# Patient Record
Sex: Female | Born: 1995 | Race: Black or African American | Hispanic: No | Smoking: Current some day smoker
Health system: Southern US, Community
[De-identification: ages and names within clinical notes are randomized; demographics above are authoritative.]

## PROBLEM LIST (undated history)

## (undated) ENCOUNTER — Inpatient Hospital Stay: Payer: Self-pay

## (undated) ENCOUNTER — Ambulatory Visit: Admission: EM | Payer: Medicaid Other | Source: Home / Self Care

## (undated) DIAGNOSIS — E663 Overweight: Secondary | ICD-10-CM

## (undated) DIAGNOSIS — D649 Anemia, unspecified: Secondary | ICD-10-CM

## (undated) DIAGNOSIS — N946 Dysmenorrhea, unspecified: Secondary | ICD-10-CM

## (undated) DIAGNOSIS — L219 Seborrheic dermatitis, unspecified: Secondary | ICD-10-CM

## (undated) DIAGNOSIS — T7840XA Allergy, unspecified, initial encounter: Secondary | ICD-10-CM

## (undated) HISTORY — DX: Overweight: E66.3

## (undated) HISTORY — DX: Dysmenorrhea, unspecified: N94.6

## (undated) HISTORY — DX: Allergy, unspecified, initial encounter: T78.40XA

## (undated) HISTORY — DX: Anemia, unspecified: D64.9

## (undated) HISTORY — PX: WISDOM TOOTH EXTRACTION: SHX21

## (undated) HISTORY — DX: Seborrheic dermatitis, unspecified: L21.9

---
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
## 2014-08-25 ENCOUNTER — Encounter: Payer: Self-pay | Admitting: Family Medicine

## 2014-08-25 ENCOUNTER — Ambulatory Visit (INDEPENDENT_AMBULATORY_CARE_PROVIDER_SITE_OTHER): Payer: Medicaid Other | Admitting: Family Medicine

## 2014-08-25 ENCOUNTER — Other Ambulatory Visit: Payer: Self-pay | Admitting: Family Medicine

## 2014-08-25 VITALS — BP 126/68 | HR 108 | Temp 98.6°F | Resp 20 | Ht 65.5 in | Wt 182.7 lb

## 2014-08-25 DIAGNOSIS — J069 Acute upper respiratory infection, unspecified: Secondary | ICD-10-CM | POA: Diagnosis not present

## 2014-08-25 DIAGNOSIS — R300 Dysuria: Secondary | ICD-10-CM | POA: Diagnosis not present

## 2014-08-25 DIAGNOSIS — N898 Other specified noninflammatory disorders of vagina: Secondary | ICD-10-CM

## 2014-08-25 DIAGNOSIS — J0110 Acute frontal sinusitis, unspecified: Secondary | ICD-10-CM | POA: Diagnosis not present

## 2014-08-25 LAB — POCT URINALYSIS DIPSTICK
Bilirubin, UA: NEGATIVE
Blood, UA: NEGATIVE
Glucose, UA: NEGATIVE
Ketones, UA: NEGATIVE
Leukocytes, UA: NEGATIVE
Nitrite, UA: NEGATIVE
Protein, UA: NEGATIVE
Spec Grav, UA: 1.02
Urobilinogen, UA: 0.2
pH, UA: 6

## 2014-08-25 MED ORDER — FLUCONAZOLE 150 MG PO TABS: 150.0000 mg | ORAL_TABLET | ORAL | Status: DC

## 2014-08-25 MED ORDER — AMOXICILLIN-POT CLAVULANATE 875-125 MG PO TABS: 1.0000 | ORAL_TABLET | Freq: Two times a day (BID) | ORAL | Status: DC

## 2014-08-25 MED ORDER — METRONIDAZOLE 0.75 % VA GEL: 1.0000 | Freq: Two times a day (BID) | VAGINAL | Status: DC

## 2014-08-25 NOTE — Patient Instructions (Signed)

## 2014-08-25 NOTE — Progress Notes (Signed)
Name: Hailey Martin   MRN: [id number]    DOB: 06/11/1995   Date:08/25/2014       Progress Note  Subjective  Chief Complaint  Chief Complaint  Patient presents with  . Dysuria    4 days  . URI    1 week- sore throat, fever  . Vaginal Discharge    itching-clear, white, milky    HPI  Dysuria: symptoms started 4 days ago, she has intermittently, she denies nocturia, but has some increase in frequency during the day, also has noticed a vaginal discharge past 2 days , that is described as white milky, and a fever since last night. Denies new sexual partner, she states she uses condoms sometimes, and denies dyspareunia at this time.  URI: symptoms started with a sore throat for the past week, some post-nasal drainage, and sometimes with a cough she has some green sputum, fever last night, frontal  Headache, no nausea, vomiting no SOB.     History  Substance Use Topics  . Smoking status: Never Smoker   . Smokeless tobacco: Not on file  . Alcohol Use: No    No current outpatient prescriptions on file.  No Known Allergies  ROS  Ten systems reviewed and is negative except as mentioned in HPI  Fever, low back pain, right flank, vaginal discharge, sore throat, headache. She has noticed some heart burn lately  Objective  Filed Vitals:   08/25/14 0903  BP: 126/68  Pulse: 108  Temp: 98.6 F (37 C)  TempSrc: Oral  Resp: 20  Height: 5' 5.5" (1.664 m)  Weight: 182 lb 11.2 oz (82.872 kg)  SpO2: 99%     Physical Exam  Constitutional: Patient appears well-developed and well-nourished. No distress. Obese HENT: Head: Normocephalic and atraumatic. Ears: B TMs ok, no erythema or effusion; Nose: Nose normal. Mouth/Throat: Oropharynx is clear and moist. Lith present on right tonsils. Tender frontal sinus Eyes: Conjunctivae and EOM are normal. Pupils are equal, round, and reactive to light. No scleral icterus.   Cardiovascular: Normal rate, regular rhythm and normal heart sounds.  No  murmur heard. No BLE edema. Pulmonary/Chest: Effort normal and breath sounds normal. No respiratory distress. Abdominal: Soft. Bowel sounds are normal, no distension. Mild discomfort on supra pubic area. no masses FEMALE GENITALIA:  External genitalia small amount of milky discharge External urethra normal Vaginal vault normal with white discharge Cervix normal without discharge or lesions Bimanual exam mild discomfort Skin: Skin is warm and dry.  No erythema.  Psychiatric: Patient has a normal mood and affect. behavior is normal. Judgment and thought content normal.  Recent Results (from the past 2160 hour(s))  POCT urinalysis dipstick     Status: None   Collection Time: 08/25/14  9:25 AM  Result Value Ref Range   Color, UA yellow    Clarity, UA clear    Glucose, UA negative    Bilirubin, UA negative    Ketones, UA negative    Spec Grav, UA 1.020    Blood, UA negative    pH, UA 6.0    Protein, UA negative    Urobilinogen, UA 0.2    Nitrite, UA negative    Leukocytes, UA Negative      Assessment & Plan  1. Dysuria ua was normal, we will send specimen for genprobe and urine culture - POCT urinalysis dipstick  2. Vaginal discharge  - PR WET MOUNTS/ W PREPARATIONS; Standing - PR WET MOUNTS/ W PREPARATIONS - GC/chlamydia probe amp, urine -  fluconazole (DIFLUCAN) 150 MG tablet; Take 1 tablet (150 mg total) by mouth 3 (three) times a week.  Dispense: 3 tablet; Refill: 0 - metroNIDAZOLE (METROGEL) 0.75 % vaginal gel; Place 1 Applicatorful vaginally 2 (two) times daily.  Dispense: 70 g; Refill: 0  3. Upper respiratory infection Take otc medications, we will treat sinusitis with antibiotics  4. Acute frontal sinusitis, recurrence not specified  - amoxicillin-clavulanate (AUGMENTIN) 875-125 MG per tablet; Take 1 tablet by mouth 2 (two) times daily.  Dispense: 20 tablet; Refill: 0

## 2014-08-29 ENCOUNTER — Telehealth: Payer: Self-pay | Admitting: Family Medicine

## 2014-08-29 NOTE — Telephone Encounter (Signed)
Pt was seen last week and was prescribed medication. She is now bleeding lightly. Her period began on May 17th and ended 6 days later. She is not understanding why the irregular bleeding. Please return call

## 2014-08-29 NOTE — Telephone Encounter (Signed)
Patient notified to call us back if bleeding does not stop.

## 2014-08-30 LAB — SPECIMEN STATUS REPORT

## 2014-08-30 LAB — URINE CULTURE

## 2014-08-31 ENCOUNTER — Telehealth: Payer: Self-pay

## 2014-08-31 NOTE — Telephone Encounter (Signed)
-----   Message from Alba CoryKrichna Sowles, MD sent at 08/30/2014  4:06 PM EDT ----- Urine culture, very low bacterial count, how is she doing?

## 2014-08-31 NOTE — Telephone Encounter (Signed)
Patient notified of urine results.

## 2014-09-05 ENCOUNTER — Telehealth: Payer: Self-pay | Admitting: Family Medicine

## 2014-09-05 NOTE — Telephone Encounter (Signed)
Schedule patient for an appointment for next week Tuesday looks good! We have nothing avaliable for this week! If she cannot wait go to ER or urgent care

## 2014-09-05 NOTE — Telephone Encounter (Signed)
Pt is still having the issue with bleeding and would like to schedule an appointment. States that she is bleeding like her period is on but its been well over a week and she has already had her menstral for the month. Please advise  [phone]

## 2014-09-07 ENCOUNTER — Encounter: Payer: Self-pay | Admitting: Family Medicine

## 2014-09-07 DIAGNOSIS — L210 Seborrhea capitis: Secondary | ICD-10-CM | POA: Insufficient documentation

## 2014-09-07 DIAGNOSIS — J302 Other seasonal allergic rhinitis: Secondary | ICD-10-CM | POA: Insufficient documentation

## 2014-09-07 DIAGNOSIS — N946 Dysmenorrhea, unspecified: Secondary | ICD-10-CM | POA: Insufficient documentation

## 2014-09-08 ENCOUNTER — Encounter: Payer: Self-pay | Admitting: Family Medicine

## 2014-09-08 ENCOUNTER — Ambulatory Visit (INDEPENDENT_AMBULATORY_CARE_PROVIDER_SITE_OTHER): Payer: Medicaid Other | Admitting: Family Medicine

## 2014-09-08 VITALS — BP 126/74 | HR 87 | Temp 98.5°F | Resp 18 | Ht 65.75 in | Wt 183.8 lb

## 2014-09-08 DIAGNOSIS — D509 Iron deficiency anemia, unspecified: Secondary | ICD-10-CM | POA: Diagnosis not present

## 2014-09-08 DIAGNOSIS — N921 Excessive and frequent menstruation with irregular cycle: Secondary | ICD-10-CM

## 2014-09-08 DIAGNOSIS — R102 Pelvic and perineal pain: Secondary | ICD-10-CM | POA: Diagnosis not present

## 2014-09-08 LAB — POCT URINE PREGNANCY: Preg Test, Ur: NEGATIVE

## 2014-09-08 NOTE — Progress Notes (Signed)
Name: Hailey Martin   MRN: [id number]    DOB: 05/04/1995   Date:09/08/2014       Progress Note  Subjective  Chief Complaint  Chief Complaint  Patient presents with  . Menstrual Problem    last period was 5/17 and then started bleeding again on 5/6 heavy steady flow with clots    HPI  Menorrhagia with irregular cycle: she usually has cycles about 28 days apart, but last cycle was 21 days apart and is lasting longer than usual, she also has noticed low back pain and mild cramping.  She is sexually active and states uses condoms 100% of the time.   Vulvar pain: she has a raiser cut on left labia majora from shaving last week, but also has noticed some pain on right pubic area during palpation.  No redness, no increase in warmth, no drainage. She does not recall trauma  Anemia iron deficiency: she has been anemic for many years, she is not taking iron supplementation as recommended.  hgb has been as low as 7.1, she never had iron infusion. She denies fatigue, no pica.    Patient Active Problem List   Diagnosis Date Noted  . Dysmenorrhea 09/07/2014  . Allergic rhinitis, seasonal 09/07/2014  . 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 History of cervicitis 11/26/2012    History reviewed. No pertinent past surgical history.  Family History  Problem Relation Age of Onset  . Heart failure Father 73    History   Social History  . Marital Status: Single    Spouse Name: N/A  . Number of Children: 0  . Years of Education: 12   Occupational History  . Deli  [employer]   Social History Main Topics  . Smoking status: Never Smoker   . Smokeless tobacco: Not on file  . Alcohol Use: No  . Drug Use: No  . Sexual Activity:    Partners: Male    Birth Control/ Protection: Condom   Other Topics Concern  . Not on file   Social History Narrative     Current outpatient prescriptions:  .  fluconazole (DIFLUCAN) 150 MG tablet, Take 1 tablet (150 mg total) by mouth 3 (three) times a  week., Disp: 3 tablet, Rfl: 0  No Known Allergies   ROS  Constitutional: Negative for fever or weight change.  Respiratory: Negative for cough and shortness of breath.   Cardiovascular: Negative for chest pain or palpitations.  Gastrointestinal: Negative for abdominal pain, no bowel changes.  Musculoskeletal: Negative for gait problem or joint swelling.  Skin: Negative for rash.  Neurological: Negative for dizziness or headache.  No other specific complaints in a complete review of systems (except as listed in HPI above).  Objective  Filed Vitals:   09/08/14 1146  BP: 126/74  Pulse: 87  Temp: 98.5 F (36.9 C)  TempSrc: Oral  Resp: 18  Height: 5' 5.75" (1.67 m)  Weight: 183 lb 12.8 oz (83.371 kg)  SpO2: 98%    Body mass index is 29.89 kg/(m^2).  Physical Exam Constitutional: Patient appears well-developed and well-nourished. No distress. Obese Eyes:  No scleral icterus.  Neck: Normal range of motion. Neck supple. Cardiovascular: Normal rate, regular rhythm and normal heart sounds.  No murmur heard. No BLE edema. Pulmonary/Chest: Effort normal and breath sounds normal. No respiratory distress. Abdominal: Soft.  There is no tenderness. Pelvic exam: pain during palpation of right supra pubic area, no redness, no increase in warm, some irritation on left labia majora -  per patient from shaving - no blisters, bimanual exam normal  Psychiatric: Patient has a normal mood and affect. behavior is normal. Judgment and thought content normal.  Recent Results (from the past 2160 hour(s))  Urine culture     Status: None   Collection Time: 08/25/14  9:00 AM  Result Value Ref Range   Urine Culture, Routine Final report    Result 1 Comment     Comment: Culture shows less than 10,000 colony forming units of bacteria per milliliter of urine. This colony count is not generally considered to be clinically significant.   Specimen status report     Status: None   Collection Time:  08/25/14  9:00 AM  Result Value Ref Range   specimen status report Comment     Comment: Written Authorization Written Authorization Written Authorization Received. Authorization received from original request 08-30-2014 Logged by Angie Ward   POCT urinalysis dipstick     Status: None   Collection Time: 08/25/14  9:25 AM  Result Value Ref Range   Color, UA yellow    Clarity, UA clear    Glucose, UA negative    Bilirubin, UA negative    Ketones, UA negative    Spec Grav, UA 1.020    Blood, UA negative    pH, UA 6.0    Protein, UA negative    Urobilinogen, UA 0.2    Nitrite, UA negative    Leukocytes, UA Negative       PHQ2/9: Depression screen PHQ 2/9 08/25/2014  Decreased Interest 0  Down, Depressed, Hopeless 0  PHQ - 2 Score 0    Fall Risk: Fall Risk  08/25/2014  Falls in the past year? No     Assessment & Plan  1. Menorrhagia with irregular cycle Explained cycles can go from 21 days to 42 days apart, we will check labs , urine pregnancy , discussed ocp to regulate cycles and also to prevent pregnancy but she refused - TSH - CBC - Comprehensive Metabolic Panel (CMET)   2. Vulvar pain Reassurance, avoid touching the area and return if no resolution  3. Iron deficiency anemia Recheck labs - Ferritin - Iron - Iron Binding Cap (TIBC)

## 2014-09-09 ENCOUNTER — Telehealth: Payer: Self-pay

## 2014-09-09 LAB — CBC
Hematocrit: 25.6 % — ABNORMAL LOW (ref 34.0–46.6)
Hemoglobin: 7.6 g/dL — ABNORMAL LOW (ref 11.1–15.9)
MCH: 21.6 pg — ABNORMAL LOW (ref 26.6–33.0)
MCHC: 29.7 g/dL — ABNORMAL LOW (ref 31.5–35.7)
MCV: 73 fL — ABNORMAL LOW (ref 79–97)
Platelets: 515 10*3/uL — ABNORMAL HIGH (ref 150–379)
RBC: 3.52 x10E6/uL — ABNORMAL LOW (ref 3.77–5.28)
RDW: 15.4 % (ref 12.3–15.4)
WBC: 5.2 10*3/uL (ref 3.4–10.8)

## 2014-09-09 LAB — FERRITIN: Ferritin: 7 ng/mL — ABNORMAL LOW (ref 15–77)

## 2014-09-09 LAB — COMPREHENSIVE METABOLIC PANEL
ALT: 11 IU/L (ref 0–32)
AST: 14 IU/L (ref 0–40)
Albumin/Globulin Ratio: 1.6 (ref 1.1–2.5)
Albumin: 4.2 g/dL (ref 3.5–5.5)
Alkaline Phosphatase: 52 IU/L (ref 39–117)
BUN/Creatinine Ratio: 17 (ref 8–20)
BUN: 8 mg/dL (ref 6–20)
Bilirubin Total: <0.2 mg/dL (ref 0.0–1.2)
CO2: 25 mmol/L (ref 18–29)
Calcium: 9.2 mg/dL (ref 8.7–10.2)
Chloride: 103 mmol/L (ref 97–108)
Creatinine, Ser: 0.48 mg/dL — ABNORMAL LOW (ref 0.57–1.00)
GFR calc Af Amer: 164 mL/min/{1.73_m2} (ref >59)
GFR calc non Af Amer: 143 mL/min/{1.73_m2} (ref >59)
Globulin, Total: 2.6 g/dL (ref 1.5–4.5)
Glucose: 84 mg/dL (ref 65–99)
Potassium: 4.3 mmol/L (ref 3.5–5.2)
Sodium: 142 mmol/L (ref 134–144)
Total Protein: 6.8 g/dL (ref 6.0–8.5)

## 2014-09-09 LAB — IRON AND TIBC
Iron Saturation: 4 % — CL (ref 15–55)
Iron: 15 ug/dL — ABNORMAL LOW (ref 27–159)
Total Iron Binding Capacity: 387 ug/dL (ref 250–450)
UIBC: 372 ug/dL (ref 131–425)

## 2014-09-09 LAB — PROLACTIN: Prolactin: 5.9 ng/mL (ref 4.8–23.3)

## 2014-09-09 LAB — TSH: TSH: 1.07 u[IU]/mL (ref 0.450–4.500)

## 2014-09-09 MED ORDER — FERROUS SULFATE 325 (65 FE) MG PO TABS: 325.0000 mg | ORAL_TABLET | Freq: Three times a day (TID) | ORAL | Status: DC

## 2014-09-09 NOTE — Telephone Encounter (Signed)
Sent prescription

## 2014-09-09 NOTE — Telephone Encounter (Signed)
Pt needs to know is the ferrous sulfate OTC or rx?

## 2014-09-13 ENCOUNTER — Ambulatory Visit: Payer: Medicaid Other | Admitting: Family Medicine

## 2014-11-01 ENCOUNTER — Encounter: Payer: Self-pay | Admitting: Emergency Medicine

## 2014-11-01 ENCOUNTER — Ambulatory Visit
Admission: EM | Admit: 2014-11-01 | Discharge: 2014-11-01 | Disposition: A | Discharge status: Home / Self Care | Payer: Medicaid Other | Attending: Family Medicine | Admitting: Family Medicine

## 2014-11-01 DIAGNOSIS — F17200 Nicotine dependence, unspecified, uncomplicated: Secondary | ICD-10-CM | POA: Insufficient documentation

## 2014-11-01 DIAGNOSIS — N390 Urinary tract infection, site not specified: Secondary | ICD-10-CM | POA: Diagnosis not present

## 2014-11-01 DIAGNOSIS — R109 Unspecified abdominal pain: Secondary | ICD-10-CM | POA: Diagnosis present

## 2014-11-01 LAB — URINALYSIS COMPLETE WITH MICROSCOPIC (ARMC ONLY)
Bilirubin Urine: NEGATIVE
Glucose, UA: NEGATIVE mg/dL
Hgb urine dipstick: NEGATIVE
Leukocytes, UA: NEGATIVE
Nitrite: NEGATIVE
Protein, ur: 30 mg/dL — AB
RBC / HPF: NONE SEEN RBC/hpf (ref <3)
Specific Gravity, Urine: 1.03 (ref 1.005–1.030)
pH: 6 (ref 5.0–8.0)

## 2014-11-01 LAB — PREGNANCY, URINE: Preg Test, Ur: NEGATIVE

## 2014-11-01 MED ORDER — SULFAMETHOXAZOLE-TRIMETHOPRIM 800-160 MG PO TABS: 1.0000 | ORAL_TABLET | Freq: Two times a day (BID) | ORAL | Status: DC

## 2014-11-01 NOTE — ED Provider Notes (Signed)
CSN: [id number]     Arrival date & time 11/01/14  1557 History   First MD Initiated Contact with Patient 11/01/14 1636     Chief Complaint  Patient presents with  . Abdominal Pain   (Consider location/radiation/quality/duration/timing/severity/associated sxs/prior Treatment) HPI Comments: 19 yo female with a 2 day h/o left sided back and abdominal pain. States has had "urine infections" in the past with similar symptoms. Denies any fevers, chills, vomiting, diarrhea, injuries, hematuria, dysuria, melena or hematochezia.   The history is provided by the patient.    History reviewed. No pertinent past medical history. History reviewed. No pertinent past surgical history. Family History  Problem Relation Age of Onset  . Heart failure Father 85   History  Substance Use Topics  . Smoking status: Current Every Day Smoker  . Smokeless tobacco: Never Used  . Alcohol Use: Not on file   OB History    No data available     Review of Systems  Allergies  Review of patient's allergies indicates no known allergies.  Home Medications   Prior to Admission medications   Medication Sig Start Date End Date Taking? Authorizing Provider  ferrous sulfate 325 (65 FE) MG tablet Take 1 tablet (325 mg total) by mouth 3 (three) times daily. 09/09/14   Alba Cory, MD  fluconazole (DIFLUCAN) 150 MG tablet Take 1 tablet (150 mg total) by mouth 3 (three) times a week. 08/25/14   Alba Cory, MD  sulfamethoxazole-trimethoprim (BACTRIM DS,SEPTRA DS) 800-160 MG per tablet Take 1 tablet by mouth 2 (two) times daily. 11/01/14   Payton Mccallum, MD   BP 122/72 mmHg  Pulse 72  Temp(Src) 98.6 F (37 C) (Oral)  Resp 18  Ht  (1.676 m)  Wt 188 lb (85.276 kg)  BMI 30.36 kg/m2  SpO2 99%  LMP 10/20/2014 Physical Exam  Constitutional: She appears well-developed and well-nourished. No distress.  Abdominal: Soft. Bowel sounds are normal. She exhibits no distension and no mass. There is no tenderness. There  is no rebound and no guarding.  No CVA tenderness  Skin: No rash noted. She is not diaphoretic.  Nursing note and vitals reviewed.   ED Course  Procedures (including critical care time) Labs Review Labs Reviewed  URINALYSIS COMPLETEWITH MICROSCOPIC ([facility] ONLY) - Abnormal; Notable for the following:    Color, Urine AMBER (*)    APPearance CLOUDY (*)    Ketones, ur TRACE (*)    Protein, ur 30 (*)    Bacteria, UA MANY (*)    Squamous Epithelial / LPF 6-30 (*)    All other components within normal limits  URINE CULTURE  PREGNANCY, URINE    Imaging Review No results found.   MDM   1. UTI (lower urinary tract infection)    Discharge Medication List as of 11/01/2014  5:53 PM    START taking these medications   Details  sulfamethoxazole-trimethoprim (BACTRIM DS,SEPTRA DS) 800-160 MG per tablet Take 1 tablet by mouth 2 (two) times daily., Starting 11/01/2014, Until Discontinued, Normal      Plan: 1. Test results and diagnosis reviewed with patient 2. rx as per orders; risks, benefits, potential side effects reviewed with patient 3. Recommend supportive treatment with increased water intake 4. F/u prn if symptoms worsen or don't improve    Payton Mccallum, MD 11/01/14 1850

## 2014-11-01 NOTE — ED Notes (Signed)
Pt with left sidev abd pain x 2 days after drinking soda

## 2014-11-08 ENCOUNTER — Ambulatory Visit (INDEPENDENT_AMBULATORY_CARE_PROVIDER_SITE_OTHER): Payer: Medicaid Other | Admitting: Family Medicine

## 2014-11-08 ENCOUNTER — Encounter: Payer: Self-pay | Admitting: Family Medicine

## 2014-11-08 VITALS — BP 122/70 | HR 93 | Temp 98.2°F | Resp 18 | Ht 65.75 in | Wt 180.0 lb

## 2014-11-08 DIAGNOSIS — D509 Iron deficiency anemia, unspecified: Secondary | ICD-10-CM

## 2014-11-08 DIAGNOSIS — Z3201 Encounter for pregnancy test, result positive: Secondary | ICD-10-CM

## 2014-11-08 DIAGNOSIS — Z23 Encounter for immunization: Secondary | ICD-10-CM | POA: Diagnosis not present

## 2014-11-08 DIAGNOSIS — Z862 Personal history of diseases of the blood and blood-forming organs and certain disorders involving the immune mechanism: Secondary | ICD-10-CM | POA: Insufficient documentation

## 2014-11-08 LAB — POCT URINE PREGNANCY: Preg Test, Ur: NEGATIVE

## 2014-11-08 NOTE — Patient Instructions (Signed)
Contraception Choices Contraception (birth control) is the use of any methods or devices to prevent pregnancy. Below are some methods to help avoid pregnancy. HORMONAL METHODS   Contraceptive implant. This is a thin, plastic tube containing progesterone hormone. It does not contain estrogen hormone. Your health care provider inserts the tube in the inner part of the upper arm. The tube can remain in place for up to 3 years. After 3 years, the implant must be removed. The implant prevents the ovaries from releasing an egg (ovulation), thickens the cervical mucus to prevent sperm from entering the uterus, and thins the lining of the inside of the uterus.  Progesterone-only injections. These injections are given every 3 months by your health care provider to prevent pregnancy. This synthetic progesterone hormone stops the ovaries from releasing eggs. It also thickens cervical mucus and changes the uterine lining. This makes it harder for sperm to survive in the uterus.  Birth control pills. These pills contain estrogen and progesterone hormone. They work by preventing the ovaries from releasing eggs (ovulation). They also cause the cervical mucus to thicken, preventing the sperm from entering the uterus. Birth control pills are prescribed by a health care provider.Birth control pills can also be used to treat heavy periods.  Minipill. This type of birth control pill contains only the progesterone hormone. They are taken every day of each month and must be prescribed by your health care provider.  Birth control patch. The patch contains hormones similar to those in birth control pills. It must be changed once a week and is prescribed by a health care provider.  Vaginal ring. The ring contains hormones similar to those in birth control pills. It is left in the vagina for 3 weeks, removed for 1 week, and then a new one is put back in place. The patient must be comfortable inserting and removing the ring  from the vagina.A health care provider's prescription is necessary.  Emergency contraception. Emergency contraceptives prevent pregnancy after unprotected sexual intercourse. This pill can be taken right after sex or up to 5 days after unprotected sex. It is most effective the sooner you take the pills after having sexual intercourse. Most emergency contraceptive pills are available without a prescription. Check with your pharmacist. Do not use emergency contraception as your only form of birth control. BARRIER METHODS   Female condom. This is a thin sheath (latex or rubber) that is worn over the penis during sexual intercourse. It can be used with spermicide to increase effectiveness.  Female condom. This is a soft, loose-fitting sheath that is put into the vagina before sexual intercourse.  Diaphragm. This is a soft, latex, dome-shaped barrier that must be fitted by a health care provider. It is inserted into the vagina, along with a spermicidal jelly. It is inserted before intercourse. The diaphragm should be left in the vagina for 6 to 8 hours after intercourse.  Cervical cap. This is a round, soft, latex or plastic cup that fits over the cervix and must be fitted by a health care provider. The cap can be left in place for up to 48 hours after intercourse.  Sponge. This is a soft, circular piece of polyurethane foam. The sponge has spermicide in it. It is inserted into the vagina after wetting it and before sexual intercourse.  Spermicides. These are chemicals that kill or block sperm from entering the cervix and uterus. They come in the form of creams, jellies, suppositories, foam, or tablets. They do not require a   prescription. They are inserted into the vagina with an applicator before having sexual intercourse. The process must be repeated every time you have sexual intercourse. INTRAUTERINE CONTRACEPTION  Intrauterine device (IUD). This is a T-shaped device that is put in a woman's uterus  during a menstrual period to prevent pregnancy. There are 2 types:  Copper IUD. This type of IUD is wrapped in copper wire and is placed inside the uterus. Copper makes the uterus and fallopian tubes produce a fluid that kills sperm. It can stay in place for 10 years.  Hormone IUD. This type of IUD contains the hormone progestin (synthetic progesterone). The hormone thickens the cervical mucus and prevents sperm from entering the uterus, and it also thins the uterine lining to prevent implantation of a fertilized egg. The hormone can weaken or kill the sperm that get into the uterus. It can stay in place for 3-5 years, depending on which type of IUD is used. PERMANENT METHODS OF CONTRACEPTION  Female tubal ligation. This is when the woman's fallopian tubes are surgically sealed, tied, or blocked to prevent the egg from traveling to the uterus.  Hysteroscopic sterilization. This involves placing a small coil or insert into each fallopian tube. Your doctor uses a technique called hysteroscopy to do the procedure. The device causes scar tissue to form. This results in permanent blockage of the fallopian tubes, so the sperm cannot fertilize the egg. It takes about 3 months after the procedure for the tubes to become blocked. You must use another form of birth control for these 3 months.  Female sterilization. This is when the female has the tubes that carry sperm tied off (vasectomy).This blocks sperm from entering the vagina during sexual intercourse. After the procedure, the man can still ejaculate fluid (semen). NATURAL PLANNING METHODS  Natural family planning. This is not having sexual intercourse or using a barrier method (condom, diaphragm, cervical cap) on days the woman could become pregnant.  Calendar method. This is keeping track of the length of each menstrual cycle and identifying when you are fertile.  Ovulation method. This is avoiding sexual intercourse during ovulation.  Symptothermal  method. This is avoiding sexual intercourse during ovulation, using a thermometer and ovulation symptoms.  Post-ovulation method. This is timing sexual intercourse after you have ovulated. Regardless of which type or method of contraception you choose, it is important that you use condoms to protect against the transmission of sexually transmitted infections (STIs). Talk with your health care provider about which form of contraception is most appropriate for you. Document Released: 03/11/2005 Document Revised: 03/16/2013 Document Reviewed: 09/03/2012 [employer] Patient Information 2015 [employer]. This information is not intended to replace advice given to you by your health care provider. Make sure you discuss any questions you have with your health care provider.  

## 2014-11-08 NOTE — Progress Notes (Signed)
Name: Hailey Martin   MRN: [id number]    DOB: 1995/12/18   Date:11/08/2014       Progress Note  Subjective  Chief Complaint  Chief Complaint  Patient presents with  . Other    Patient has had two positive at home pregnant test, last LMP was October 12, 2014.     HPI  Pregnancy test positive: she is dating - Caesar Chestnut - and LMP was 10/12/2014, she had pregnancy test done 3 days ago and it was positive twice. She also has noticed some abdominal cramping, some breast tenderness and has increase in vaginal discharge - no pruritus no odor. She has not been using protection to avoid pregnancy  Iron deficiency anemia: still not taking ferrous sulfate   Patient Active Problem List   Diagnosis Date Noted  . History of anemia 11/08/2014  . Dysmenorrhea 09/07/2014  . Allergic rhinitis, seasonal 09/07/2014  . 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 History of cervicitis 11/26/2012    History reviewed. No pertinent past surgical history.  Family History  Problem Relation Age of Onset  . Heart failure Father 37    Social History   Social History  . Marital Status: Single    Spouse Name: N/A  . Number of Children: 0  . Years of Education: 12   Occupational History  . Deli  [employer]   Social History Main Topics  . Smoking status: Never Smoker   . Smokeless tobacco: Never Used  . Alcohol Use: No  . Drug Use: No  . Sexual Activity:    Partners: Male    Birth Control/ Protection: Condom   Other Topics Concern  . Not on file   Social History Narrative     Current outpatient prescriptions:  .  ferrous sulfate 325 (65 FE) MG tablet, Take 1 tablet (325 mg total) by mouth 3 (three) times daily., Disp: 90 tablet, Rfl: 2 .  metroNIDAZOLE (METROGEL) 0.75 % vaginal gel, I 1 APL VAGINALLY BID, Disp: , Rfl: 0  No Known Allergies   ROS  Constitutional: Negative for fever or weight change.  Respiratory: Negative for cough and shortness of breath.   Cardiovascular: Negative for chest  pain or palpitations.  Gastrointestinal: Negative for abdominal pain, no bowel changes.  Musculoskeletal: Negative for gait problem or joint swelling.  Skin: Negative for rash.  Neurological: Negative for dizziness or headache.  No other specific complaints in a complete review of systems (except as listed in HPI above).  Objective  Filed Vitals:   11/08/14 1200  BP: 122/70  Pulse: 93  Temp: 98.2 F (36.8 C)  TempSrc: Oral  Resp: 18  Height: 5' 5.75" (1.67 m)  Weight: 180 lb (81.647 kg)  SpO2: 97%    Body mass index is 29.28 kg/(m^2).  Physical Exam  Constitutional: Patient appears well-developed and well-nourished. Obese  No distress.  HEENT: head atraumatic, normocephalic, pupils equal and reactive to light, neck supple, throat within normal limits Cardiovascular: Normal rate, regular rhythm and normal heart sounds.  No murmur heard. No BLE edema. Pulmonary/Chest: Effort normal and breath sounds normal. No respiratory distress. Abdominal: Soft.  There is no tenderness. Mild supra pubic pain - she refuses to be checked for STI or have pelvic exam. Psychiatric: Patient has a normal mood and affect. behavior is normal. Judgment and thought content normal.  Recent Results (from the past 2160 hour(s))  Urine culture     Status: None   Collection Time: 08/25/14  9:00 AM  Result Value  Ref Range   Urine Culture, Routine Final report    Result 1 Comment     Comment: Culture shows less than 10,000 colony forming units of bacteria per milliliter of urine. This colony count is not generally considered to be clinically significant.   Specimen status report     Status: None   Collection Time: 08/25/14  9:00 AM  Result Value Ref Range   specimen status report Comment     Comment: Written Authorization Written Authorization Written Authorization Received. Authorization received from original request 08-30-2014 Logged by Angie Ward   POCT urinalysis dipstick     Status: None    Collection Time: 08/25/14  9:25 AM  Result Value Ref Range   Color, UA yellow    Clarity, UA clear    Glucose, UA negative    Bilirubin, UA negative    Ketones, UA negative    Spec Grav, UA 1.020    Blood, UA negative    pH, UA 6.0    Protein, UA negative    Urobilinogen, UA 0.2    Nitrite, UA negative    Leukocytes, UA Negative   POCT urine pregnancy     Status: Normal   Collection Time: 09/08/14 12:40 PM  Result Value Ref Range   Preg Test, Ur Negative Negative  TSH     Status: None   Collection Time: 09/08/14 12:55 PM  Result Value Ref Range   TSH 1.070 0.450 - 4.500 uIU/mL  CBC     Status: Abnormal   Collection Time: 09/08/14 12:55 PM  Result Value Ref Range   WBC 5.2 3.4 - 10.8 x10E3/uL   RBC 3.52 (L) 3.77 - 5.28 x10E6/uL   Hemoglobin 7.6 (L) 11.1 - 15.9 g/dL   Hematocrit 81.1 (L) 91.4 - 46.6 %   MCV 73 (L) 79 - 97 fL   MCH 21.6 (L) 26.6 - 33.0 pg   MCHC 29.7 (L) 31.5 - 35.7 g/dL   RDW 78.2 95.6 - 21.3 %   Platelets 515 (H) 150 - 379 x10E3/uL  Comprehensive Metabolic Panel (CMET)     Status: Abnormal   Collection Time: 09/08/14 12:55 PM  Result Value Ref Range   Glucose 84 65 - 99 mg/dL   BUN 8 6 - 20 mg/dL   Creatinine, Ser 0.86 (L) 0.57 - 1.00 mg/dL   GFR calc non Af Amer 143 >59 mL/min/1.73   GFR calc Af Amer 164 >59 mL/min/1.73   BUN/Creatinine Ratio 17 8 - 20   Sodium 142 134 - 144 mmol/L   Potassium 4.3 3.5 - 5.2 mmol/L   Chloride 103 97 - 108 mmol/L   CO2 25 18 - 29 mmol/L   Calcium 9.2 8.7 - 10.2 mg/dL   Total Protein 6.8 6.0 - 8.5 g/dL   Albumin 4.2 3.5 - 5.5 g/dL   Globulin, Total 2.6 1.5 - 4.5 g/dL   Albumin/Globulin Ratio 1.6 1.1 - 2.5   Bilirubin Total <0.2 0.0 - 1.2 mg/dL   Alkaline Phosphatase 52 39 - 117 IU/L   AST 14 0 - 40 IU/L   ALT 11 0 - 32 IU/L  Iron and TIBC     Status: Abnormal   Collection Time: 09/08/14 12:55 PM  Result Value Ref Range   Total Iron Binding Capacity 387 250 - 450 ug/dL   UIBC 578 469 - 629 ug/dL   Iron 15 (L)  27 - 528 ug/dL   Iron Saturation 4 (LL) 15 - 55 %  Prolactin  Status: None   Collection Time: 09/08/14 12:55 PM  Result Value Ref Range   Prolactin 5.9 4.8 - 23.3 ng/mL  Ferritin     Status: Abnormal   Collection Time: 09/08/14 12:55 PM  Result Value Ref Range   Ferritin 7 (L) 15 - 77 ng/mL  Urinalysis complete, with microscopic     Status: Abnormal   Collection Time: 11/01/14  4:59 PM  Result Value Ref Range   Color, Urine AMBER (A) YELLOW   APPearance CLOUDY (A) CLEAR   Glucose, UA NEGATIVE NEGATIVE mg/dL   Bilirubin Urine NEGATIVE NEGATIVE   Ketones, ur TRACE (A) NEGATIVE mg/dL   Specific Gravity, Urine 1.030 1.005 - 1.030   Hgb urine dipstick NEGATIVE NEGATIVE   pH 6.0 5.0 - 8.0   Protein, ur 30 (A) NEGATIVE mg/dL   Nitrite NEGATIVE NEGATIVE   Leukocytes, UA NEGATIVE NEGATIVE   RBC / HPF NONE SEEN <3 RBC/hpf   WBC, UA 6-30 <3 WBC/hpf   Bacteria, UA MANY (A) RARE   Squamous Epithelial / LPF 6-30 (A) RARE   Mucous PRESENT   Pregnancy, urine     Status: None   Collection Time: 11/01/14  4:59 PM  Result Value Ref Range   Preg Test, Ur NEGATIVE NEGATIVE  POCT urine pregnancy     Status: None   Collection Time: 11/08/14 11:58 AM  Result Value Ref Range   Preg Test, Ur Negative Negative     PHQ2/9: Depression screen [facility] 2/9 11/08/2014 08/25/2014  Decreased Interest 0 0  Down, Depressed, Hopeless 0 0  PHQ - 2 Score 0 0     Fall Risk: Fall Risk  11/08/2014 08/25/2014  Falls in the past year? No No    Assessment & Plan   1. Pregnancy test positive Negative test in our office, explained importance of preventing pregnancy if she is not pregnant - boyfriend refuses to use condoms. Discussed IUD, we will check B-HCG - POCT urine pregnancy - B-HCG Quant  2. Iron deficiency anemia Needs to take ferrous sulfate - CBC with Differential/Platelet  3. Needs flu shot  - Flu Vaccine QUAD 36+ mos IM - refused

## 2014-11-09 ENCOUNTER — Telehealth: Payer: Self-pay

## 2014-11-09 LAB — CBC WITH DIFFERENTIAL/PLATELET
Basophils Absolute: 0 10*3/uL (ref 0.0–0.2)
Basos: 0 %
EOS (ABSOLUTE): 0 10*3/uL (ref 0.0–0.4)
Eos: 1 %
Hematocrit: 26.8 % — ABNORMAL LOW (ref 34.0–46.6)
Hemoglobin: 7.8 g/dL — ABNORMAL LOW (ref 11.1–15.9)
Immature Grans (Abs): 0 10*3/uL (ref 0.0–0.1)
Immature Granulocytes: 0 %
Lymphocytes Absolute: 2.1 10*3/uL (ref 0.7–3.1)
Lymphs: 27 %
MCH: 18.2 pg — ABNORMAL LOW (ref 26.6–33.0)
MCHC: 29.1 g/dL — ABNORMAL LOW (ref 31.5–35.7)
MCV: 63 fL — ABNORMAL LOW (ref 79–97)
Monocytes Absolute: 0.4 10*3/uL (ref 0.1–0.9)
Monocytes: 6 %
Neutrophils Absolute: 5.1 10*3/uL (ref 1.4–7.0)
Neutrophils: 66 %
Platelets: 466 10*3/uL — ABNORMAL HIGH (ref 150–379)
RBC: 4.28 x10E6/uL (ref 3.77–5.28)
RDW: 17.4 % — ABNORMAL HIGH (ref 12.3–15.4)
WBC: 7.7 10*3/uL (ref 3.4–10.8)

## 2014-11-09 LAB — BETA HCG QUANT (REF LAB): hCG Quant: <1 m[IU]/mL

## 2014-11-09 NOTE — Telephone Encounter (Signed)
-----   Message from Alba Cory, MD sent at 11/09/2014  8:05 AM EDT ----- She needs to start taking ferrous sulfate daily She is not pregnant, and needs to return to discuss contraception to avoid unwanted pregnancy

## 2014-11-09 NOTE — Telephone Encounter (Signed)
Left message for patient to return my call for lab results. 

## 2014-11-10 NOTE — Progress Notes (Signed)
Patient notified and states she is still thinking about the birth control Dr. Carlynn Purl and patient discuss at visit.

## 2014-12-08 ENCOUNTER — Ambulatory Visit: Payer: Medicaid Other | Admitting: Family Medicine

## 2015-02-27 ENCOUNTER — Telehealth: Payer: Self-pay | Admitting: Family Medicine

## 2015-02-27 MED ORDER — KETOCONAZOLE 2 % EX SHAM: 1.0000 "application " | MEDICATED_SHAMPOO | CUTANEOUS | Status: DC

## 2015-02-27 NOTE — Telephone Encounter (Signed)
PT ASKING FOR REFILL ON SHAMPOO FOR HER SCALP. PHARM IS [facility] [place]

## 2015-02-27 NOTE — Telephone Encounter (Signed)
In old chart Nizoral

## 2015-02-27 NOTE — Telephone Encounter (Signed)
done

## 2015-07-06 ENCOUNTER — Observation Stay
Admission: EM | Admit: 2015-07-06 | Discharge: 2015-07-06 | Disposition: A | Discharge status: Home / Self Care | Payer: Medicaid Other | Attending: Obstetrics & Gynecology | Admitting: Obstetrics & Gynecology

## 2015-07-06 DIAGNOSIS — O99283 Endocrine, nutritional and metabolic diseases complicating pregnancy, third trimester: Secondary | ICD-10-CM | POA: Diagnosis not present

## 2015-07-06 DIAGNOSIS — R519 Headache, unspecified: Secondary | ICD-10-CM | POA: Diagnosis present

## 2015-07-06 DIAGNOSIS — Z3A33 33 weeks gestation of pregnancy: Secondary | ICD-10-CM | POA: Diagnosis not present

## 2015-07-06 DIAGNOSIS — R51 Headache: Secondary | ICD-10-CM

## 2015-07-06 DIAGNOSIS — E860 Dehydration: Secondary | ICD-10-CM | POA: Insufficient documentation

## 2015-07-06 DIAGNOSIS — O9989 Other specified diseases and conditions complicating pregnancy, childbirth and the puerperium: Secondary | ICD-10-CM | POA: Diagnosis present

## 2015-07-06 NOTE — Discharge Instructions (Signed)
May take over the counter Tylenol extra strength as directed on label when needed for mild cramping and headache pain. Drink plenty of fluids and water to stay hydrated. Avoid strenuous activity, rest when possible. Report any signs of preterm labor as discussed at discharge and refer to PTL handout.

## 2015-07-06 NOTE — Progress Notes (Signed)
Pt states she does not like drinking water and was at work today when she started feeling dizzy. Pt provided with water, apple juice and cracker while in triage. FHT reactive, no contractions tracing and none palpated. VS stable, pt denies nausea, vomiting while in triage, headache, dizziness, visual changes have all improved and was not evident when last pt stood up and ambulated to restroom. Confirms +FM.  Discharge instructions and teaching completed with pt and family present. Discharge paperwork including handouts related to preterm labor precautions given to pt. Encouraged pt to rest, stay well hydrated, take meds as prescribed and f/u with primary OB at next scheduled appt or sooner if symptoms persist or worsens. Pt is aware she may return to hospital with any worsening symptoms. Understanding verbalized.

## 2015-07-06 NOTE — Discharge Summary (Signed)
[name] is a 20 y.o. female. She is at 10137w0d gestation.  Indication: felt dizzy, headache, and threw up at work x1  S: Resting comfortably. no CTX, no VB. Active fetal movement. Concerned about not feeling well.  She had sudden onset dizzyness, with some nausea and vomiting after returning to work after a few days off.  She has not had much to drink today.  O:  BP 109/69 mmHg  Pulse 74  Temp(Src) 98.6 F (37 C) (Oral)  Resp 15  LMP 10/12/2014    Gen: NAD, AAOx3      Abd: FNTTP      Ext: Non-tender, Nonedmeatous    FHT:  145 mod + accels no decels TOCO: quiet SVE: deferred   A/P:  19yo G1P0 with dehydration.   Labor: not present.   PO liquid intake encouraged.   Fetal Wellbeing: Reassuring Cat 1 tracing.  D/c home stable, precautions reviewed, follow-up as scheduled.   Ward, Elenora Fenderhelsea C

## 2015-07-06 NOTE — OB Triage Note (Signed)
Ms. Hailey Martin here stating "I didn't feel normal", c/o mild headache, slight dizziness, felt hot and sleepy. Reports positive fetal movement

## 2015-07-07 ENCOUNTER — Telehealth: Payer: Self-pay

## 2015-07-07 NOTE — Telephone Encounter (Signed)
Patient called stating that she needs her iron pills and that the [facility] was out but the [place] location was going to charge her $63. I then called the [place] location to confirm, and was told that she was not requesting the Ferrous Sulfate but the Iron Fusion Plus, a medication that we never prescribed).   I contacted the patient back at [phone]) and she stated that she was confused about the two Iron pills. She stated that [facility] put her on the Iron Fusion Plus. I told her that she could call the pharmacy back to get the Ferrous sulfate or she could call [facility] for a refill of her Iron Fusion Plus.

## 2015-07-09 ENCOUNTER — Emergency Department
Admission: EM | Admit: 2015-07-09 | Discharge: 2015-07-09 | Disposition: A | Discharge status: Home / Self Care | Payer: Medicaid Other | Attending: Emergency Medicine | Admitting: Emergency Medicine

## 2015-07-09 ENCOUNTER — Encounter: Payer: Self-pay | Admitting: Emergency Medicine

## 2015-07-09 DIAGNOSIS — Z3A33 33 weeks gestation of pregnancy: Secondary | ICD-10-CM | POA: Diagnosis not present

## 2015-07-09 DIAGNOSIS — O2313 Infections of bladder in pregnancy, third trimester: Secondary | ICD-10-CM | POA: Diagnosis not present

## 2015-07-09 DIAGNOSIS — N3000 Acute cystitis without hematuria: Secondary | ICD-10-CM

## 2015-07-09 DIAGNOSIS — O219 Vomiting of pregnancy, unspecified: Secondary | ICD-10-CM | POA: Diagnosis present

## 2015-07-09 LAB — LIPASE, BLOOD: Lipase: 20 U/L (ref 11–51)

## 2015-07-09 LAB — COMPREHENSIVE METABOLIC PANEL
ALT: 10 U/L — ABNORMAL LOW (ref 14–54)
AST: 19 U/L (ref 15–41)
Albumin: 3 g/dL — ABNORMAL LOW (ref 3.5–5.0)
Alkaline Phosphatase: 162 U/L — ABNORMAL HIGH (ref 38–126)
Anion gap: 5 (ref 5–15)
BUN: 6 mg/dL (ref 6–20)
CO2: 25 mmol/L (ref 22–32)
Calcium: 8.9 mg/dL (ref 8.9–10.3)
Chloride: 106 mmol/L (ref 101–111)
Creatinine, Ser: 0.35 mg/dL — ABNORMAL LOW (ref 0.44–1.00)
GFR calc Af Amer: >60 mL/min (ref >60)
GFR calc non Af Amer: >60 mL/min (ref >60)
Glucose, Bld: 122 mg/dL — ABNORMAL HIGH (ref 65–99)
Potassium: 3.7 mmol/L (ref 3.5–5.1)
Sodium: 136 mmol/L (ref 135–145)
Total Bilirubin: 0.2 mg/dL — ABNORMAL LOW (ref 0.3–1.2)
Total Protein: 6.6 g/dL (ref 6.5–8.1)

## 2015-07-09 LAB — CBC
HCT: 33.4 % — ABNORMAL LOW (ref 35.0–47.0)
Hemoglobin: 11.1 g/dL — ABNORMAL LOW (ref 12.0–16.0)
MCH: 29.2 pg (ref 26.0–34.0)
MCHC: 33.2 g/dL (ref 32.0–36.0)
MCV: 87.9 fL (ref 80.0–100.0)
Platelets: 235 10*3/uL (ref 150–440)
RBC: 3.8 MIL/uL (ref 3.80–5.20)
RDW: 13.5 % (ref 11.5–14.5)
WBC: 13.8 10*3/uL — ABNORMAL HIGH (ref 3.6–11.0)

## 2015-07-09 LAB — URINALYSIS COMPLETE WITH MICROSCOPIC (ARMC ONLY)
Bilirubin Urine: NEGATIVE
Glucose, UA: NEGATIVE mg/dL
Hgb urine dipstick: NEGATIVE
Nitrite: NEGATIVE
Protein, ur: NEGATIVE mg/dL
Specific Gravity, Urine: 1.019 (ref 1.005–1.030)
pH: 7 (ref 5.0–8.0)

## 2015-07-09 MED ORDER — METOCLOPRAMIDE HCL 10 MG PO TABS: 10.0000 mg | ORAL_TABLET | Freq: Three times a day (TID) | ORAL | Status: DC | PRN

## 2015-07-09 MED ORDER — METOCLOPRAMIDE HCL 5 MG/ML IJ SOLN
10.0000 mg | Freq: Once | INTRAMUSCULAR | Status: AC
  Administered 2015-07-09: 10 mg via INTRAVENOUS
  Filled 2015-07-09: qty 2

## 2015-07-09 MED ORDER — SODIUM CHLORIDE 0.9 % IV BOLUS (SEPSIS)
500.0000 mL | Freq: Once | INTRAVENOUS | Status: AC
  Administered 2015-07-09: 500 mL via INTRAVENOUS

## 2015-07-09 MED ORDER — NITROFURANTOIN MACROCRYSTAL 100 MG PO CAPS: 100.0000 mg | ORAL_CAPSULE | Freq: Four times a day (QID) | ORAL | Status: DC

## 2015-07-09 MED ORDER — ACETAMINOPHEN 325 MG PO TABS
650.0000 mg | ORAL_TABLET | Freq: Once | ORAL | Status: AC
  Administered 2015-07-09: 650 mg via ORAL
  Filled 2015-07-09: qty 2

## 2015-07-09 NOTE — Discharge Instructions (Signed)
Pregnancy and Urinary Tract Infection  A urinary tract infection (UTI) is a bacterial infection of the urinary tract. Infection of the urinary tract can include the ureters, kidneys (pyelonephritis), bladder (cystitis), and urethra (urethritis). All pregnant women should be screened for bacteria in the urinary tract. Identifying and treating a UTI will decrease the risk of preterm labor and developing more serious infections in both the mother and baby.  CAUSES  Bacteria germs cause almost all UTIs.   RISK FACTORS  Many factors can increase your chances of getting a UTI during pregnancy. These include:  · Having a short urethra.  · Poor toilet and hygiene habits.  · Sexual intercourse.  · Blockage of urine along the urinary tract.  · Problems with the pelvic muscles or nerves.  · Diabetes.  · Obesity.  · Bladder problems after having several children.  · Previous history of UTI.  SIGNS AND SYMPTOMS   · Pain, burning, or a stinging feeling when urinating.  · Suddenly feeling the need to urinate right away (urgency).  · Loss of bladder control (urinary incontinence).  · Frequent urination, more than is common with pregnancy.  · Lower abdominal or back discomfort.  · Cloudy urine.  · Blood in the urine (hematuria).  · Fever.   When the kidneys are infected, the symptoms may be:  · Back pain.  · Flank pain on the right side more so than the left.  · Fever.  · Chills.  · Nausea.  · Vomiting.  DIAGNOSIS   A urinary tract infection is usually diagnosed through urine tests. Additional tests and procedures are sometimes done. These may include:  · Ultrasound exam of the kidneys, ureters, bladder, and urethra.  · Looking in the bladder with a lighted tube (cystoscopy).  TREATMENT  Typically, UTIs can be treated with antibiotic medicines.   HOME CARE INSTRUCTIONS   · Only take over-the-counter or prescription medicines as directed by your health care provider. If you were prescribed antibiotics, take them as directed. Finish  them even if you start to feel better.  · Drink enough fluids to keep your urine clear or pale yellow.  · Do not have sexual intercourse until the infection is gone and your health care provider says it is okay.  · Make sure you are tested for UTIs throughout your pregnancy. These infections often come back.   Preventing a UTI in the Future  · Practice good toilet habits. Always wipe from front to back. Use the tissue only once.  · Do not hold your urine. Empty your bladder as soon as possible when the urge comes.  · Do not douche or use deodorant sprays.  · Wash with soap and warm water around the genital area and the anus.  · Empty your bladder before and after sexual intercourse.  · Wear underwear with a cotton crotch.  · Avoid caffeine and carbonated drinks. They can irritate the bladder.  · Drink cranberry juice or take cranberry pills. This may decrease the risk of getting a UTI.  · Do not drink alcohol.  · Keep all your appointments and tests as scheduled.   SEEK MEDICAL CARE IF:   · Your symptoms get worse.  · You are still having fevers 2 or more days after treatment begins.  · You have a rash.  · You feel that you are having problems with medicines prescribed.  · You have abnormal vaginal discharge.  SEEK IMMEDIATE MEDICAL CARE IF:   · You have back or flank   pain.  · You have chills.  · You have blood in your urine.  · You have nausea and vomiting.  · You have contractions of your uterus.  · You have a gush of fluid from the vagina.  MAKE SURE YOU:  · Understand these instructions.    · Will watch your condition.    · Will get help right away if you are not doing well or get worse.       This information is not intended to replace advice given to you by your health care provider. Make sure you discuss any questions you have with your health care provider.     Document Released: 07/06/2010 Document Revised: 12/30/2012 Document Reviewed: 10/08/2012  Elsevier Interactive Patient Education ©2016 [employer]  Inc[employer]

## 2015-07-09 NOTE — ED Provider Notes (Signed)
Time Seen: Approximately 1610  I have reviewed the triage notes  Chief Complaint: Dizziness; Morning Sickness; and Emesis During Pregnancy   History of Present Illness: Hailey Martin is a 20 y.o. female *who presents with complaints of some dizziness described as vertiginous type symptoms where she feels off balance on occasion. She has some mild bilateral temporal headache. She denies any loose stool or diarrhea those had some nausea and vomited 3 in the last couple of days. She denies any focal weakness or photophobia. She denies any vaginal discharge or bleeding is currently [redacted] weeks pregnant. She states she has felt fetal movements she denies any abdominal pain She states she is on prenatal vitamins and she was concerned that she may have some anemia.   Past Medical History  Diagnosis Date  . Dysmenorrhea   . Allergy   . Seborrhea   . Anemia   . Overweight     Patient Active Problem List   Diagnosis Date Noted  . Headache 07/06/2015  . Iron deficiency anemia 11/08/2014  . Dysmenorrhea 09/07/2014  . Allergic rhinitis, seasonal 09/07/2014  . 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 History of cervicitis 11/26/2012    History reviewed. No pertinent past surgical history.  History reviewed. No pertinent past surgical history.  Current Outpatient Rx  Name  Route  Sig  Dispense  Refill  . ferrous sulfate 325 (65 FE) MG tablet   Oral   Take 1 tablet (325 mg total) by mouth 3 (three) times daily. Patient not taking: Reported on 07/07/2015   90 tablet   2   . Iron-FA-B Cmp-C-Biot-Probiotic (FUSION PLUS) CAPS      TK 1 C PO BID      11   . Prenatal Multivit-Min-Fe-FA (PRENATAL VITAMINS PO)   Oral   Take 1 tablet by mouth daily.           Allergies:  Review of patient's allergies indicates no known allergies.  Family History: Family History  Problem Relation Age of Onset  . Heart failure Father [id number]    Social History: Social History  Substance Use Topics  .  Smoking status: Never Smoker   . Smokeless tobacco: Never Used  . Alcohol Use: No     Review of Systems:   10 point review of systems was performed and was otherwise negative:  Constitutional: No fever Eyes: No visual disturbances ENT: No sore throat, ear pain Cardiac: No chest pain Respiratory: No shortness of breath, wheezing, or stridor Abdomen: No abdominal pain, no vomiting, No diarrhea Endocrine: No weight loss, No night sweats Extremities: No peripheral edema, cyanosis Skin: No rashes, easy bruising Neurologic: No focal weakness, trouble with speech or swollowing Urologic: No dysuria, Hematuria, or urinary frequency   Physical Exam:  ED Triage Vitals  Enc Vitals Group     BP 07/09/15 1556 127/67 mmHg     Pulse Rate 07/09/15 1556 88     Resp 07/09/15 1556 16     Temp 07/09/15 1556 98.1 F (36.7 C)     Temp Source 07/09/15 1556 Oral     SpO2 07/09/15 1556 100 %     Weight 07/09/15 1556 204 lb (92.534 kg)     Height 07/09/15 1556 5\' 6"  (1.676 m)     Head Cir --      Peak Flow --      Pain Score 07/09/15 1559 0     Pain Loc --      Pain Edu? --  Excl. in GC? --     General: Awake , Alert , and Oriented times 3; GCS 15 Head: Normal cephalic , atraumatic Eyes: Pupils equal , round, reactive to light Nose/Throat: No nasal drainage, patent upper airway without erythema or exudate.  Neck: Supple, Full range of motion, No anterior adenopathy or palpable thyroid masses Lungs: Clear to ascultation without wheezes , rhonchi, or rales Heart: Regular rate, regular rhythm without murmurs , gallops , or rubs Abdomen: The Lieopold manuevers show proximally 10-12 cm above the umbilicus Soft, non tender without rebound, guarding , or rigidity; bowel sounds positive and symmetric in all 4 quadrants. No organomegaly .        Extremities: 2 plus symmetric pulses. No edema, clubbing or cyanosis Neurologic: normal ambulation, Motor symmetric without deficits, sensory  intact Skin: warm, dry, no rashes   Labs:   All laboratory work was reviewed including any pertinent negatives or positives listed below:  Labs Reviewed  LIPASE, BLOOD  COMPREHENSIVE METABOLIC PANEL  CBC  URINALYSIS COMPLETEWITH MICROSCOPIC ([facility] ONLY)  Patient's hemoglobin appears to be increasing and stable on her iron supplement tablet     ED Course:  Patient was given IV fluid bolus. She was given Reglan for nausea and her headache which has some symptomatic improvement. She was also given some Tylenol. Her third trimester was unlikely to be hyperemesis gravidarum and I suspect this may be nausea and vomiting secondary to urinary tract infection. She does have a headache that is bilateral reproducible over the temporal area and was relieved with Tylenol and I don't suspect cavernous venous thrombosis or meningitis, encephalitis, intracerebral bleeding patient's otherwise feeling better and able tolerate oral fluids and I felt we could discharge her prescription for antibiotics for urinary tract infection. She does continue to have fetal movements and her fetal heart tones were within normal limits. She denies any vaginal discharge or bleeding. The patient's currently afebrile and I felt was unlikely had pyelonephritis at this time.    Assessment:  Third trimester pregnancy Nausea or vomiting Tension headache Iron deficiency anemia      Plan: * Outpatient management Patient was advised to return immediately if condition worsens. Patient was advised to follow up with their primary care physician or other specialized physicians involved in their outpatient care. The patient and/or family member/power of attorney had laboratory results reviewed at the bedside. All questions and concerns were addressed and appropriate discharge instructions were distributed by the nursing staff.            Hailey Moccasin, MD 07/09/15 [phone]

## 2015-07-09 NOTE — ED Notes (Signed)
C/O headache, dizziness earlier today.  Patient has history of anemia and has taken iron pills in the past. Also vomiting x 3 days.  Able to eat and drink, but nausea, dizziness, and vomiting occurs intermittently.  Denies Abdominal pain, No Vaginal Discharge, No Vaginal bleeding.  + fetal movement.

## 2015-07-11 LAB — URINE CULTURE: Culture: 1000 — AB

## 2015-08-02 LAB — OB RESULTS CONSOLE GBS: GBS: POSITIVE

## 2015-08-27 ENCOUNTER — Observation Stay
Admission: EM | Admit: 2015-08-27 | Discharge: 2015-08-27 | Disposition: A | Discharge status: Home / Self Care | Payer: Medicaid Other | Attending: Obstetrics and Gynecology | Admitting: Obstetrics and Gynecology

## 2015-08-27 ENCOUNTER — Encounter: Payer: Self-pay | Admitting: *Deleted

## 2015-08-27 DIAGNOSIS — Z3A40 40 weeks gestation of pregnancy: Secondary | ICD-10-CM | POA: Diagnosis not present

## 2015-08-27 DIAGNOSIS — O471 False labor at or after 37 completed weeks of gestation: Principal | ICD-10-CM | POA: Insufficient documentation

## 2015-08-27 NOTE — OB Triage Note (Signed)
Mucous with bloody discharge. Elaina HoopsElks, Paddy Walthall S

## 2015-08-27 NOTE — Discharge Summary (Signed)
Physician Discharge Summary  Patient ID: Hailey Martin MRN: [id number] DOB/AGE: 21/07/1995 20 y.o.  Admit date: 08/27/2015 Discharge date: 08/27/2015  Admission Diagnoses: G1P0 at 3535w3d with complaint of contractions and bloody mucous discharge  Discharge Diagnoses:  Active Problems:   Indication for care in labor and delivery, antepartum IUP at 435w3d with reassuring non-stress test not in labor, membranes intact  Discharged Condition: good  Hospital Course: pt admitted for observation, placed on monitors, cervical exam, discharge to home  Consults: None  Significant Diagnostic Studies: none  Treatments: none  Cervix: 1/60/-3 on admission and without change on discharge Toco: negative Fetal Well Being: 120 bpm baseline, moderate variability, + accelerations, - decelerations, category I  Discharge Exam: Blood pressure 128/75, pulse 80, temperature 98.4 F (36.9 C), temperature source Oral, resp. rate 16, height 5\' 6"  (1.676 m), weight 93.895 kg (207 lb), last menstrual period 10/12/2014. General appearance: alert, cooperative and appears stated age  Disposition: 01-Home or Self Care      Discharge Instructions    Discharge activity:  No Restrictions    Complete by:  As directed      Discharge diet:  No restrictions    Complete by:  As directed      Fetal Kick Count:  Lie on our left side for one hour after a meal, and count the number of times your baby kicks.  If it is less than 5 times, get up, move around and drink some juice.  Repeat the test 30 minutes later.  If it is still less than 5 kicks in an hour, notify your doctor.    Complete by:  As directed      LABOR:  When conractions begin, you should start to time them from the beginning of one contraction to the beginning  of the next.  When contractions are 5 - 10 minutes apart or less and have been regular for at least an hour, you should call your health care provider.    Complete by:  As directed      No sexual  activity restrictions    Complete by:  As directed      Notify physician for bleeding from the vagina    Complete by:  As directed      Notify physician for blurring of vision or spots before the eyes    Complete by:  As directed      Notify physician for chills or fever    Complete by:  As directed      Notify physician for fainting spells, "black outs" or loss of consciousness    Complete by:  As directed      Notify physician for increase in vaginal discharge    Complete by:  As directed      Notify physician for leaking of fluid    Complete by:  As directed      Notify physician for pain or burning when urinating    Complete by:  As directed      Notify physician for pelvic pressure (sudden increase)    Complete by:  As directed      Notify physician for severe or continued nausea or vomiting    Complete by:  As directed      Notify physician for sudden gushing of fluid from the vagina (with or without continued leaking)    Complete by:  As directed      Notify physician for sudden, constant, or occasional abdominal pain    Complete  by:  As directed      Notify physician if baby moving less than usual    Complete by:  As directed             Medication List    STOP taking these medications        metoCLOPramide 10 MG tablet  Commonly known as:  REGLAN     nitrofurantoin 100 MG capsule  Commonly known as:  MACRODANTIN      TAKE these medications        ferrous sulfate 325 (65 FE) MG tablet  Take 325 mg by mouth 2 (two) times daily with a meal.     PRENATAL VITAMINS PO  Take 1 tablet by mouth every morning.       Follow-up Information    Please follow up.   Why:  regular scheduled prenatal appointment      Signed: Tresea Mall, CNM

## 2015-08-29 ENCOUNTER — Inpatient Hospital Stay: Payer: Medicaid Other

## 2015-08-29 ENCOUNTER — Inpatient Hospital Stay: Payer: Medicaid Other | Admitting: Registered Nurse

## 2015-08-29 ENCOUNTER — Encounter
Admission: EM | Disposition: A | Discharge status: Home / Self Care | Payer: Self-pay | Source: Home / Self Care | Attending: Obstetrics and Gynecology

## 2015-08-29 ENCOUNTER — Inpatient Hospital Stay
Admission: EM | Admit: 2015-08-29 | Discharge: 2015-09-01 | DRG: 765 | Disposition: A | Discharge status: Home / Self Care | Payer: Medicaid Other | Attending: Obstetrics and Gynecology | Admitting: Obstetrics and Gynecology

## 2015-08-29 DIAGNOSIS — Z3A40 40 weeks gestation of pregnancy: Secondary | ICD-10-CM

## 2015-08-29 DIAGNOSIS — O4593 Premature separation of placenta, unspecified, third trimester: Secondary | ICD-10-CM | POA: Diagnosis present

## 2015-08-29 DIAGNOSIS — O99824 Streptococcus B carrier state complicating childbirth: Secondary | ICD-10-CM | POA: Diagnosis present

## 2015-08-29 DIAGNOSIS — O9902 Anemia complicating childbirth: Secondary | ICD-10-CM | POA: Diagnosis present

## 2015-08-29 DIAGNOSIS — O99324 Drug use complicating childbirth: Secondary | ICD-10-CM | POA: Diagnosis present

## 2015-08-29 DIAGNOSIS — O4693 Antepartum hemorrhage, unspecified, third trimester: Secondary | ICD-10-CM

## 2015-08-29 DIAGNOSIS — O480 Post-term pregnancy: Principal | ICD-10-CM | POA: Diagnosis present

## 2015-08-29 DIAGNOSIS — D62 Acute posthemorrhagic anemia: Secondary | ICD-10-CM | POA: Diagnosis not present

## 2015-08-29 LAB — CBC
HCT: 31.9 % — ABNORMAL LOW (ref 35.0–47.0)
Hemoglobin: 10.3 g/dL — ABNORMAL LOW (ref 12.0–16.0)
MCH: 26.8 pg (ref 26.0–34.0)
MCHC: 32.2 g/dL (ref 32.0–36.0)
MCV: 83.2 fL (ref 80.0–100.0)
Platelets: 232 10*3/uL (ref 150–440)
RBC: 3.83 MIL/uL (ref 3.80–5.20)
RDW: 14.5 % (ref 11.5–14.5)
WBC: 13.5 10*3/uL — ABNORMAL HIGH (ref 3.6–11.0)

## 2015-08-29 LAB — URINE DRUG SCREEN, QUALITATIVE (ARMC ONLY)
Amphetamines, Ur Screen: NOT DETECTED
Barbiturates, Ur Screen: NOT DETECTED
Benzodiazepine, Ur Scrn: NOT DETECTED
Cannabinoid 50 Ng, Ur ~~LOC~~: NOT DETECTED
Cocaine Metabolite,Ur ~~LOC~~: NOT DETECTED
MDMA (Ecstasy)Ur Screen: NOT DETECTED
Methadone Scn, Ur: NOT DETECTED
Opiate, Ur Screen: POSITIVE — AB
Phencyclidine (PCP) Ur S: NOT DETECTED
Tricyclic, Ur Screen: NOT DETECTED

## 2015-08-29 LAB — TYPE AND SCREEN
ABO/RH(D): A POS
Antibody Screen: NEGATIVE

## 2015-08-29 LAB — KLEIHAUER-BETKE STAIN: Fetal Cells %: 0 %

## 2015-08-29 SURGERY — Surgical Case
Anesthesia: General

## 2015-08-29 MED ORDER — DIPHENHYDRAMINE HCL 25 MG PO CAPS: 25.0000 mg | ORAL_CAPSULE | Freq: Four times a day (QID) | ORAL | Status: DC | PRN

## 2015-08-29 MED ORDER — SODIUM CHLORIDE 0.9 % IV SOLN
INTRAVENOUS | Status: AC
  Filled 2015-08-29: qty 2000

## 2015-08-29 MED ORDER — SOD CITRATE-CITRIC ACID 500-334 MG/5ML PO SOLN
ORAL | Status: AC
Start: 2015-08-29 — End: 2015-08-29
  Administered 2015-08-29: 30 mL via ORAL
  Filled 2015-08-29: qty 15

## 2015-08-29 MED ORDER — DIBUCAINE 1 % RE OINT: 1.0000 "application " | TOPICAL_OINTMENT | RECTAL | Status: DC | PRN

## 2015-08-29 MED ORDER — CEFAZOLIN SODIUM-DEXTROSE 2-4 GM/100ML-% IV SOLN
2.0000 g | INTRAVENOUS | Status: AC
  Administered 2015-08-29: 2 g via INTRAVENOUS

## 2015-08-29 MED ORDER — SODIUM CHLORIDE FLUSH 0.9 % IV SOLN
INTRAVENOUS | Status: AC
  Filled 2015-08-29: qty 10

## 2015-08-29 MED ORDER — ONDANSETRON HCL 4 MG/2ML IJ SOLN: 4.0000 mg | Freq: Four times a day (QID) | INTRAMUSCULAR | Status: DC | PRN

## 2015-08-29 MED ORDER — LIDOCAINE-EPINEPHRINE (PF) 1.5 %-1:200000 IJ SOLN
INTRAMUSCULAR | Status: DC | PRN
  Administered 2015-08-29: 3 mL via PERINEURAL

## 2015-08-29 MED ORDER — SODIUM CHLORIDE 0.9% FLUSH: 9.0000 mL | INTRAVENOUS | Status: DC | PRN

## 2015-08-29 MED ORDER — NALOXONE HCL 0.4 MG/ML IJ SOLN
0.4000 mg | INTRAMUSCULAR | Status: DC | PRN
  Filled 2015-08-29: qty 1

## 2015-08-29 MED ORDER — ONDANSETRON HCL 4 MG/2ML IJ SOLN
INTRAMUSCULAR | Status: DC | PRN
  Administered 2015-08-29: 4 mg via INTRAVENOUS

## 2015-08-29 MED ORDER — CEFAZOLIN SODIUM-DEXTROSE 2-4 GM/100ML-% IV SOLN
INTRAVENOUS | Status: AC
Start: 2015-08-29 — End: 2015-08-29
  Administered 2015-08-29: 2 g via INTRAVENOUS
  Filled 2015-08-29: qty 100

## 2015-08-29 MED ORDER — PRENATAL MULTIVITAMIN CH
1.0000 | ORAL_TABLET | Freq: Every day | ORAL | Status: DC
  Administered 2015-08-30 – 2015-09-01 (×3): 1 via ORAL
  Filled 2015-08-29 (×6): qty 1

## 2015-08-29 MED ORDER — LACTATED RINGERS IV SOLN: 500.0000 mL | INTRAVENOUS | Status: DC | PRN

## 2015-08-29 MED ORDER — ACETAMINOPHEN 325 MG PO TABS
650.0000 mg | ORAL_TABLET | ORAL | Status: DC | PRN
  Administered 2015-08-29 – 2015-08-30 (×2): 650 mg via ORAL
  Filled 2015-08-29 (×2): qty 2

## 2015-08-29 MED ORDER — OXYTOCIN 10 UNIT/ML IJ SOLN
INTRAMUSCULAR | Status: AC
  Filled 2015-08-29: qty 2

## 2015-08-29 MED ORDER — PROPOFOL 10 MG/ML IV BOLUS
INTRAVENOUS | Status: DC | PRN
  Administered 2015-08-29: 180 mg via INTRAVENOUS

## 2015-08-29 MED ORDER — SODIUM CHLORIDE 0.9 % IV SOLN
1.0000 g | INTRAVENOUS | Status: DC
  Administered 2015-08-29: 1 g via INTRAVENOUS
  Filled 2015-08-29 (×2): qty 1000

## 2015-08-29 MED ORDER — SUCCINYLCHOLINE CHLORIDE 20 MG/ML IJ SOLN
INTRAMUSCULAR | Status: DC | PRN
  Administered 2015-08-29: 160 mg via INTRAVENOUS

## 2015-08-29 MED ORDER — FENTANYL 2.5 MCG/ML W/ROPIVACAINE 0.2% IN NS 100 ML EPIDURAL INFUSION (ARMC-ANES)
EPIDURAL | Status: AC
  Administered 2015-08-29: 10 mL/h via EPIDURAL
  Filled 2015-08-29: qty 100

## 2015-08-29 MED ORDER — OXYTOCIN BOLUS FROM INFUSION: 500.0000 mL | INTRAVENOUS | Status: DC

## 2015-08-29 MED ORDER — BUPIVACAINE HCL (PF) 0.5 % IJ SOLN
INTRAMUSCULAR | Status: DC | PRN
  Administered 2015-08-29: 10 mL

## 2015-08-29 MED ORDER — DIPHENHYDRAMINE HCL 12.5 MG/5ML PO ELIX
12.5000 mg | ORAL_SOLUTION | Freq: Four times a day (QID) | ORAL | Status: DC | PRN
  Filled 2015-08-29: qty 5

## 2015-08-29 MED ORDER — MORPHINE SULFATE 2 MG/ML IV SOLN: INTRAVENOUS | Status: DC

## 2015-08-29 MED ORDER — MISOPROSTOL 200 MCG PO TABS
ORAL_TABLET | ORAL | Status: AC
  Filled 2015-08-29: qty 4

## 2015-08-29 MED ORDER — KETOROLAC TROMETHAMINE 30 MG/ML IJ SOLN
30.0000 mg | Freq: Four times a day (QID) | INTRAMUSCULAR | Status: AC | PRN
Start: 2015-08-29 — End: 2015-08-30
  Administered 2015-08-29 – 2015-08-30 (×2): 30 mg via INTRAVENOUS
  Filled 2015-08-29 (×2): qty 1

## 2015-08-29 MED ORDER — MORPHINE SULFATE (PF) 10 MG/ML IV SOLN
INTRAVENOUS | Status: AC
  Administered 2015-08-29: 5 mg
  Filled 2015-08-29: qty 1

## 2015-08-29 MED ORDER — MORPHINE SULFATE (PF) 4 MG/ML IV SOLN: 5.0000 mg | Freq: Once | INTRAVENOUS | Status: DC

## 2015-08-29 MED ORDER — AMMONIA AROMATIC IN INHA
RESPIRATORY_TRACT | Status: AC
  Filled 2015-08-29: qty 10

## 2015-08-29 MED ORDER — SENNOSIDES-DOCUSATE SODIUM 8.6-50 MG PO TABS
2.0000 | ORAL_TABLET | ORAL | Status: DC
  Administered 2015-08-30 – 2015-08-31 (×2): 2 via ORAL
  Filled 2015-08-29 (×4): qty 2

## 2015-08-29 MED ORDER — SIMETHICONE 80 MG PO CHEW
80.0000 mg | CHEWABLE_TABLET | ORAL | Status: DC | PRN
  Filled 2015-08-29: qty 1

## 2015-08-29 MED ORDER — ACETAMINOPHEN 325 MG PO TABS: 650.0000 mg | ORAL_TABLET | ORAL | Status: DC | PRN

## 2015-08-29 MED ORDER — BUPIVACAINE HCL (PF) 0.25 % IJ SOLN
INTRAMUSCULAR | Status: DC | PRN
  Administered 2015-08-29 (×2): 4 mL via EPIDURAL

## 2015-08-29 MED ORDER — BUPIVACAINE 0.25 % ON-Q PUMP DUAL CATH 400 ML
400.0000 mL | INJECTION | Status: DC
  Filled 2015-08-29: qty 400

## 2015-08-29 MED ORDER — TERBUTALINE SULFATE 1 MG/ML IJ SOLN: 0.2500 mg | Freq: Once | INTRAMUSCULAR | Status: DC | PRN

## 2015-08-29 MED ORDER — BUTORPHANOL TARTRATE 1 MG/ML IJ SOLN: 1.0000 mg | INTRAMUSCULAR | Status: DC | PRN

## 2015-08-29 MED ORDER — DEXAMETHASONE SODIUM PHOSPHATE 10 MG/ML IJ SOLN
INTRAMUSCULAR | Status: DC | PRN
  Administered 2015-08-29: 5 mg via INTRAVENOUS

## 2015-08-29 MED ORDER — BUPIVACAINE 0.25 % ON-Q PUMP DUAL CATH 400 ML
INJECTION | Status: AC
  Filled 2015-08-29: qty 400

## 2015-08-29 MED ORDER — SIMETHICONE 80 MG PO CHEW
80.0000 mg | CHEWABLE_TABLET | ORAL | Status: DC
Start: 2015-08-30 — End: 2015-09-01
  Administered 2015-08-30 – 2015-08-31 (×3): 80 mg via ORAL
  Filled 2015-08-29 (×4): qty 1

## 2015-08-29 MED ORDER — WITCH HAZEL-GLYCERIN EX PADS: 1.0000 "application " | MEDICATED_PAD | CUTANEOUS | Status: DC | PRN

## 2015-08-29 MED ORDER — FENTANYL CITRATE (PF) 100 MCG/2ML IJ SOLN
INTRAMUSCULAR | Status: AC
  Administered 2015-08-29: 25 ug
  Filled 2015-08-29: qty 2

## 2015-08-29 MED ORDER — AMPICILLIN SODIUM 2 G IJ SOLR
2.0000 g | Freq: Once | INTRAMUSCULAR | Status: AC
  Administered 2015-08-29: 2 g via INTRAVENOUS

## 2015-08-29 MED ORDER — OXYTOCIN 40 UNITS IN LACTATED RINGERS INFUSION - SIMPLE MED: 2.5000 [IU]/h | INTRAVENOUS | Status: DC

## 2015-08-29 MED ORDER — SIMETHICONE 80 MG PO CHEW
80.0000 mg | CHEWABLE_TABLET | Freq: Three times a day (TID) | ORAL | Status: DC
  Administered 2015-08-29 – 2015-09-01 (×8): 80 mg via ORAL
  Filled 2015-08-29 (×16): qty 1

## 2015-08-29 MED ORDER — DIPHENHYDRAMINE HCL 50 MG/ML IJ SOLN: 12.5000 mg | Freq: Four times a day (QID) | INTRAMUSCULAR | Status: DC | PRN

## 2015-08-29 MED ORDER — FENTANYL CITRATE (PF) 100 MCG/2ML IJ SOLN
INTRAMUSCULAR | Status: AC
  Administered 2015-08-29: 100 ug
  Filled 2015-08-29: qty 2

## 2015-08-29 MED ORDER — OXYTOCIN 40 UNITS IN LACTATED RINGERS INFUSION - SIMPLE MED
1.0000 m[IU]/min | INTRAVENOUS | Status: DC
  Administered 2015-08-29: 1 m[IU]/min via INTRAVENOUS
  Filled 2015-08-29: qty 1000

## 2015-08-29 MED ORDER — MENTHOL 3 MG MT LOZG
1.0000 | LOZENGE | OROMUCOSAL | Status: DC | PRN
  Filled 2015-08-29: qty 9

## 2015-08-29 MED ORDER — LIDOCAINE HCL (PF) 1 % IJ SOLN
INTRAMUSCULAR | Status: DC | PRN
  Administered 2015-08-29: 3 mL via SUBCUTANEOUS

## 2015-08-29 MED ORDER — FENTANYL CITRATE (PF) 250 MCG/5ML IJ SOLN
INTRAMUSCULAR | Status: DC | PRN
  Administered 2015-08-29: 100 ug via INTRAVENOUS
  Administered 2015-08-29: 100 ug
  Administered 2015-08-29: 25 ug via INTRAVENOUS
  Administered 2015-08-29: 100 ug
  Administered 2015-08-29: 25 ug via INTRAVENOUS

## 2015-08-29 MED ORDER — LIDOCAINE HCL (PF) 1 % IJ SOLN: 30.0000 mL | INTRAMUSCULAR | Status: DC | PRN

## 2015-08-29 MED ORDER — LACTATED RINGERS IV SOLN
INTRAVENOUS | Status: DC
  Administered 2015-08-30: 20:00:00 via INTRAVENOUS

## 2015-08-29 MED ORDER — OXYTOCIN 40 UNITS IN LACTATED RINGERS INFUSION - SIMPLE MED
2.5000 [IU]/h | INTRAVENOUS | Status: DC
  Administered 2015-08-29: 2.5 [IU]/h via INTRAVENOUS
  Filled 2015-08-29: qty 1000

## 2015-08-29 MED ORDER — COCONUT OIL OIL: 1.0000 "application " | TOPICAL_OIL | Status: DC | PRN

## 2015-08-29 MED ORDER — AZITHROMYCIN 500 MG IV SOLR
500.0000 mg | INTRAVENOUS | Status: DC
  Administered 2015-08-29: 500 mg via INTRAVENOUS
  Filled 2015-08-29: qty 500

## 2015-08-29 MED ORDER — SOD CITRATE-CITRIC ACID 500-334 MG/5ML PO SOLN
30.0000 mL | ORAL | Status: AC
  Administered 2015-08-29: 30 mL via ORAL
  Filled 2015-08-29: qty 30

## 2015-08-29 MED ORDER — LIDOCAINE HCL (PF) 1 % IJ SOLN
INTRAMUSCULAR | Status: AC
  Filled 2015-08-29: qty 30

## 2015-08-29 MED ORDER — LACTATED RINGERS IV SOLN
INTRAVENOUS | Status: DC | PRN
  Administered 2015-08-29 (×2): via INTRAVENOUS

## 2015-08-29 MED ORDER — LACTATED RINGERS IV SOLN
INTRAVENOUS | Status: DC
  Administered 2015-08-29: 04:00:00 via INTRAVENOUS

## 2015-08-29 SURGICAL SUPPLY — 28 items
BAG COUNTER SPONGE EZ (MISCELLANEOUS) ×2 IMPLANT
CANISTER SUCT 3000ML (MISCELLANEOUS) ×3 IMPLANT
CATH KIT ON-Q SILVERSOAK 5IN (CATHETERS) ×6 IMPLANT
CHLORAPREP W/TINT 26ML (MISCELLANEOUS) ×6 IMPLANT
CLOSURE WOUND 1/2 X4 (GAUZE/BANDAGES/DRESSINGS) ×1
COUNTER SPONGE BAG EZ (MISCELLANEOUS) ×1
DRSG TELFA 3X8 NADH (GAUZE/BANDAGES/DRESSINGS) ×3 IMPLANT
ELECT CAUTERY BLADE 6.4 (BLADE) IMPLANT
ELECT REM PT RETURN 9FT ADLT (ELECTROSURGICAL) ×3
ELECTRODE REM PT RTRN 9FT ADLT (ELECTROSURGICAL) ×1 IMPLANT
GAUZE SPONGE 4X4 12PLY STRL (GAUZE/BANDAGES/DRESSINGS) ×3 IMPLANT
GLOVE BIO SURGEON STRL SZ7 (GLOVE) ×3 IMPLANT
GLOVE INDICATOR 7.5 STRL GRN (GLOVE) ×3 IMPLANT
GOWN STRL REUS W/ TWL LRG LVL3 (GOWN DISPOSABLE) ×3 IMPLANT
GOWN STRL REUS W/TWL LRG LVL3 (GOWN DISPOSABLE) ×6
LIQUID BAND (GAUZE/BANDAGES/DRESSINGS) ×3 IMPLANT
NS IRRIG 1000ML POUR BTL (IV SOLUTION) ×3 IMPLANT
PACK C SECTION AR (MISCELLANEOUS) ×3 IMPLANT
PAD OB MATERNITY 4.3X12.25 (PERSONAL CARE ITEMS) ×3 IMPLANT
PAD PREP 24X41 OB/GYN DISP (PERSONAL CARE ITEMS) ×3 IMPLANT
SPONGE LAP 18X18 5 PK (GAUZE/BANDAGES/DRESSINGS) ×6 IMPLANT
STRIP CLOSURE SKIN 1/2X4 (GAUZE/BANDAGES/DRESSINGS) ×2 IMPLANT
SUT MNCRL AB 4-0 PS2 18 (SUTURE) ×3 IMPLANT
SUT PDS AB 1 TP1 96 (SUTURE) ×6 IMPLANT
SUT PLAIN 2 0 XLH (SUTURE) ×3 IMPLANT
SUT VIC AB 0 CTX 36 (SUTURE) ×6
SUT VIC AB 0 CTX36XBRD ANBCTRL (SUTURE) ×3 IMPLANT
SUT VIC AB 2-0 CT1 36 (SUTURE) ×3 IMPLANT

## 2015-08-29 NOTE — Progress Notes (Signed)
Patient with increasing abdominal pain, despite epidural, then started developing increased vaginal bleeding with passage of about of blood.  FHT 125, moderate, +accels, no decels.  Given amount of bleeding and worsening pain concern for at least partial abruption will proceed with stat C-section and obtain KB

## 2015-08-29 NOTE — Transfer of Care (Signed)
Immediate Anesthesia Transfer of Care Note  Patient: Hailey Martin  Procedure(s) Performed: Procedure(s): CESAREAN SECTION (N/A)  Patient Location: PACU  Anesthesia Type:General  Level of Consciousness: awake and patient cooperative  Airway & Oxygen Therapy: Patient Spontanous Breathing and Patient connected to face mask oxygen  Post-op Assessment: Report given to RN  Post vital signs: Reviewed and stable  Last Vitals:  Filed Vitals:   08/29/15 1239 08/29/15 1256               08/29/15  1418  BP: 119/60 139/70                        143/98  Pulse: 89 94                                89  Temp:                                       98.62F  Resp:                                      18    Last Pain:  Filed Vitals:   08/29/15 1342  PainSc: 6          Complications: No apparent anesthesia complications

## 2015-08-29 NOTE — Op Note (Signed)
Preoperative Diagnosis: 1) 20 y.o. G2P0010 at 6810w5d 2) Placental abruption  Postoperative Diagnosis: 1) 20 y.o. G2P1010 at 3310w5d 2) Placental abruption  Operation Performed: Primary low transverse C-section via pfannenstiel skin incision  Indication: Patient presented in term labor, minimal progression to 3cm.  Started developing heavier vaginal bleeding with passage of 300mL of blood clot, sudden onset of severe abdominal pain and fundal tenderness despite epidural   Anesthesia: General  Primary Surgeon: Vena AustriaAndreas Pina Sirianni, MD  Assistant: Thomasene MohairStephen Jackson, MD  Preoperative Antibiotics: 2g ancef and 500mg  of azithromycin  Estimated Blood Loss: 900mL  IV Fluids: 900mL  Urine Output:: pending  Drains or Tubes: Foley to gravity drainage, ON-Q catheter system  Implants: none  Specimens Removed: none  Complications: none  Intraoperative Findings:  Normal tubes ovaries and uterus.  Delivery resulted in the birth of a liveborn female, APGAR (1 MIN): 8   APGAR (5 MINS): 9, weight pending  Patient Condition: stable  Procedure in Detail:  Patient was taken to the operating room were she was administered regional anesthesia.  She was positioned in the supine position, prepped and draped in the  Usual sterile fashion.  Prior to proceeding with the case a time out was performed and the level of anesthetic was checked and noted to be adequate.  Utilizing the scalpel a pfannenstiel skin incision was made 2cm above the pubic symphysis and carried down sharply to the the level of the rectus fascia.  The fascia was incised in the midline using the scalpel and then extended using manual tractions.    The midline was identified, the peritoneum was entered bluntly and expanded using manual tractions.  The uterus was noted to be in a none rotated position.  Next the bladder blade was placed retracting the bladder caudally.  A bladder flap was not created.  A low transverse incision was scored on the  lower uterine segment.  The hysterotomy was entered bluntly using the operators finger.  The hysterotomy incision was extended using manual traction.  The operators hand was placed within the hysterotomy position noting the fetus to be within the OA position.  The vertex was grasped, flexed, brought to the incision, and delivered a traumatically using fundal pressure.  The remainder of the body delivered with ease.  The infant was suctioned, cord was clamped and cut before handing off to the awaiting neonatologist.  The placenta was delivered using manual extraction.  The uterus was exteriorized, wiped clean of clots and debris using two moist laps.  The hysterotomy was closed using a two layer closure of 0 Vicryl, with the first being a running locked, the second a vertical imbricating.  The uterus was returned to the abdomen.  The peritoneal gutters were wiped clean of clots and debris using two moist laps.  The hysterotomy incision was re-inspected noted to be hemostatic.  The rectus muscles were re-approximated in the midline using a single 2-0 Vicryl mattress stitch.  The rectus muscles were inspected noted to be hemostatic.  The superior border of the rectus fascia was grasped with a Kocher clamp.  The ON-Q trocars were then placed 4cm above the superior border of the incision and tunneled subfascially.  The introducers were removed and the catheters were threaded through the sleeves after which the sleeves were removed.  The fascia was closed using a looped #1 PDS in a running fashion taking 1cm by 1cm bites.  The subcutaneous tissue was irrigated using warm saline, hemostasis achieved using the bovis.  The subcutaneous  dead space was greater 3cm and was closed.  The subcutaneous dead space was obliterated by using a 53-T 0 Chromic in a running fashion.   The skin was closed using staples.  Sponge needle and instrument counts were corrects times two.  The patient tolerated the procedure well and was taken  to the recovery room in stable condition.

## 2015-08-29 NOTE — Anesthesia Preprocedure Evaluation (Addendum)
Anesthesia Evaluation  Patient identified by MRN, date of birth, ID band Patient awake    Reviewed: Allergy & Precautions, H&P , NPO status , Patient's Chart, lab work & pertinent test results  History of Anesthesia Complications Negative for: history of anesthetic complications  Airway Mallampati: II  TM Distance: >3 FB Neck ROM: limited    Dental no notable dental hx.    Pulmonary neg pulmonary ROS,    Pulmonary exam normal        Cardiovascular negative cardio ROS Normal cardiovascular exam - Systolic Click    Neuro/Psych  Headaches, negative psych ROS   GI/Hepatic negative GI ROS, Neg liver ROS,   Endo/Other  negative endocrine ROS  Renal/GU negative Renal ROS  negative genitourinary   Musculoskeletal   Abdominal   Peds  Hematology  (+) anemia ,   Anesthesia Other Findings   Reproductive/Obstetrics (+) Pregnancy                             Anesthesia Physical Anesthesia Plan  ASA: II  Anesthesia Plan: Epidural   Post-op Pain Management:    Induction:   Airway Management Planned:   Additional Equipment:   Intra-op Plan:   Post-operative Plan:   Informed Consent: I have reviewed the patients History and Physical, chart, labs and discussed the procedure including the risks, benefits and alternatives for the proposed anesthesia with the patient or authorized representative who has indicated his/her understanding and acceptance.     Plan Discussed with: Anesthesiologist  Anesthesia Plan Comments:         Anesthesia Quick Evaluation

## 2015-08-29 NOTE — Progress Notes (Signed)
L&D Progress Note  20 yo G2 P0010 with EDC=08/24/2015 presented just after midnight with contractions. Had some vaginal bleeding at 0540 this Am, saturating a dinner plate size area on Chux. Cervix remained 2/70% despite painful contractions.  Had an ultrasound this AM revealing a Grade 3 placenta but no obvious abruption. AFI was a little more than 4cm.  S: Feels pain of contractions in lower abdomen.   O: BP 120/70 mmHg  Pulse 89  Temp(Src) 98.1 F (36.7 C) (Oral)  Resp 18  Ht 5\' 6"  (1.676 m)  Wt 93.895 kg (207 lb)  BMI 33.43 kg/m2  LMP 10/12/2014  General: sleepy, groggy from pain med FHR: 130 baseline with accelerations to 150s, moderate variability Toco: uterine irritability with a contraction q8 min apart 10 cm area of blood on the Chux at this time  A: IUP at 40.5 weeks with vaginal bleeding-possible marginal abuption Cat 1 tracing at this time  P:Discussed POM with Dr Bonney AidStaebler: will start Pitocin augmentation and monitor FHR tracing and bleeding closely.  Advised patient of POM and the possibility of needing to do a CS for bleeding or FITL. GBS PPX has been started Early epidural if desires  Farrel ConnersGUTIERREZ, Kirandeep Fariss, CNM

## 2015-08-29 NOTE — H&P (Signed)
OB History & Physical   History of Present Illness:  Chief Complaint: G1P0 at 486w5d with every 5 minute contractions  HPI:  Hailey Martin is a 20 y.o. G2P0010 female at 8986w5d dated by LMP confirmed by U/S.  Her pregnancy has been complicated by Marijuana use, Anemia, Gonnorhea 5/10 (TOC 5/26).    She reports contractions.   She denies leakage of fluid.   She denies vaginal bleeding on admission.   She reports fetal movement.    Maternal Medical History:   Past Medical History  Diagnosis Date  . Dysmenorrhea   . Allergy   . Seborrhea   . Anemia   . Overweight     Past Surgical History  Procedure Laterality Date  . Wisdom tooth extraction    . No past surgeries      No Known Allergies  Prior to Admission medications   Medication Sig Start Date End Date Taking? Authorizing Provider  ferrous sulfate 325 (65 FE) MG tablet Take 325 mg by mouth 2 (two) times daily with a meal.    Historical Provider, MD  Prenatal Multivit-Min-Fe-FA (PRENATAL VITAMINS PO) Take 1 tablet by mouth every morning.     Historical Provider, MD    OB History  Gravida Para Term Preterm AB SAB TAB Ectopic Multiple Living  1 0 0 0 0 0 0 0 0 0     # Outcome Date GA Lbr Len/2nd Weight Sex Delivery Anes PTL Lv  1 Current               Prenatal care site: [facility]  Social History: She  reports that she has never smoked. She has never used smokeless tobacco. She reports that she does not drink alcohol or use illicit drugs.  Family History: family history includes Heart failure (age of onset: 3635) in her father.   Review of Systems: Negative x 10 systems reviewed except as noted in the HPI.    Physical Exam:  Vital Signs: BP 120/70 mmHg  Pulse 89  Temp(Src) 98.6 F (37 C) (Oral)  Resp 14  Ht 5\' 6"  (1.676 m)  Wt 93.895 kg (207 lb)  BMI 33.43 kg/m2  LMP 10/12/2014 General: no acute distress.  HEENT: normocephalic, atraumatic Heart: regular rate & rhythm.  No murmurs/rubs/gallops Lungs:  clear to auscultation bilaterally Abdomen: soft, gravid, non-tender;  EFW: 8 pounds Pelvic:   External: Normal external female genitalia  Cervix: Dilation: 2 / Effacement (%): 70 / Station: -2     Extremities: non-tender, symmetric, No edema bilaterally.  DTRs: 2+ bilaterally Neurologic: Alert & oriented x 3.    Pertinent Results:  Prenatal Labs: Blood type/Rh A positive  Antibody screen negative  Rubella Immune  Varicella Immune    RPR Non Reactive  HBsAg negative  HIV negative  GC Positive on 5/10 TOC 5/26  Chlamydia negative  Genetic screening 1st negative  1 hour GTT 91  3 hour GTT NA  GBS positive on 5/10   Baseline FHR: 130 beats/min   Variability: moderate   Accelerations: present   Decelerations: absent on admission Contractions: present frequency: q 5 min Overall assessment: Category I    Assessment:  Hailey Martin is a 20 y.o. 52P0010 female at 8386w5d with contractions.   Plan:  1. Admit to Labor & Delivery  2. CBC, T&S, Clrs, IVF, UDS 3. GBS positive treat with ampicillin  4. Fetal well-being: Category I   Rmani Kapusta, CNM

## 2015-08-29 NOTE — Progress Notes (Signed)
C/O strong contractions needing pain relief at 0300, no cervical change from admission- 5mg  IV morphine ordered for therapeutic rest. Report from patient that medicine helped her get some rest.  Episode of vaginal bleeding dinner plate size soaking of underpad with one grape size clot per RN at 0540. Bedside U/S ordered to rule out placental abruption. U/S at bedside at 0835  Cervical exam at 0745: 2/70/-2 with bloody show noted Toco: Q 3-5 minutes Fetal Well Being: Category II 130 bpm baseline, moderate variability, + accelerations, + deceleration to 100 x 1.5 minutes at 0523, a few other variables noted  Plan: 1. Augmentation of labor with pitocin pending results of U/S 2. Ampicillin for GBS  Hailey Martin, CNM

## 2015-08-29 NOTE — Anesthesia Procedure Notes (Addendum)
Epidural Patient location during procedure: OB Start time: 08/29/2015 11:30 AM End time: 08/29/2015 11:36 AM  Staffing Resident/CRNA: Stormy FabianURTIS, LINDA Performed by: resident/CRNA   Preanesthetic Checklist Completed: patient identified, site marked, surgical consent, pre-op evaluation, timeout performed, IV checked, risks and benefits discussed and monitors and equipment checked  Epidural Patient position: sitting Prep: Betadine Patient monitoring: heart rate, continuous pulse ox and blood pressure Approach: midline Location: L4-L5 Injection technique: LOR saline  Needle:  Needle type: Tuohy  Needle gauge: 17 G Needle length: 9 cm and 9 Needle insertion depth: 7 cm Catheter type: closed end flexible Catheter size: 19 Gauge Catheter at skin depth: 10 cm Test dose: negative and 1.5% lidocaine with Epi 1:200 K  Assessment Sensory level: T10 Events: blood not aspirated, injection not painful, no injection resistance, negative IV test and no paresthesia  Additional Notes Patient identified. Risks/Benefits/Options discussed with patient including but not limited to bleeding, infection, nerve damage, paralysis, failed block, incomplete pain control, headache, blood pressure changes, nausea, vomiting, reactions to medication both or allergic, itching and postpartum back pain. Confirmed with bedside nurse the patient's most recent platelet count. Confirmed with patient that they are not currently taking any anticoagulation, have any bleeding history or any family history of bleeding disorders. Patient expressed understanding and wished to proceed. All questions were answered. Sterile technique was used throughout the entire procedure. Please see nursing notes for vital signs. Test dose was given through epidural catheter and negative prior to continuing to dose epidural or start infusion. Warning signs of high block given to the patient including shortness of breath, tingling/numbness in hands,  complete motor block, or any concerning symptoms with instructions to call for help. Patient was given instructions on fall risk and not to get out of bed. All questions and concerns addressed with instructions to call with any issues or inadequate analgesia.   Patient tolerated the insertion well without immediate complications.Reason for block:procedure for pain  Procedure Name: Intubation Date/Time: 08/29/2015 1:19 PM Performed by: Lily KocherPERALTA, Trasean Delima Pre-anesthesia Checklist: Patient identified, Patient being monitored, Timeout performed, Emergency Drugs available and Suction available Patient Re-evaluated:Patient Re-evaluated prior to inductionOxygen Delivery Method: Circle system utilized Preoxygenation: Pre-oxygenation with 100% oxygen Intubation Type: IV induction, Rapid sequence and Cricoid Pressure applied Laryngoscope Size: Mac and 3 Grade View: Grade II Tube type: Oral Tube size: 7.0 mm Number of attempts: 1 Airway Equipment and Method: Stylet Placement Confirmation: ETT inserted through vocal cords under direct vision,  positive ETCO2 and breath sounds checked- equal and bilateral Secured at: 21 cm Tube secured with: Tape Dental Injury: Teeth and Oropharynx as per pre-operative assessment

## 2015-08-29 NOTE — OB Triage Note (Signed)
Pt. reports contractions approximately every fur minutes that have intensified throughout the day. Denies VB, LOF. Endorses good fetal movement.

## 2015-08-30 ENCOUNTER — Encounter: Payer: Self-pay | Admitting: Obstetrics and Gynecology

## 2015-08-30 LAB — CBC
HCT: 25.3 % — ABNORMAL LOW (ref 35.0–47.0)
Hemoglobin: 8.3 g/dL — ABNORMAL LOW (ref 12.0–16.0)
MCH: 27.2 pg (ref 26.0–34.0)
MCHC: 32.8 g/dL (ref 32.0–36.0)
MCV: 82.9 fL (ref 80.0–100.0)
Platelets: 210 10*3/uL (ref 150–440)
RBC: 3.05 MIL/uL — ABNORMAL LOW (ref 3.80–5.20)
RDW: 14.8 % — ABNORMAL HIGH (ref 11.5–14.5)
WBC: 16.9 10*3/uL — ABNORMAL HIGH (ref 3.6–11.0)

## 2015-08-30 LAB — RPR: RPR Ser Ql: NONREACTIVE

## 2015-08-30 MED ORDER — DOCUSATE SODIUM 100 MG PO CAPS
100.0000 mg | ORAL_CAPSULE | Freq: Every day | ORAL | Status: DC
  Administered 2015-08-30 – 2015-08-31 (×2): 100 mg via ORAL
  Filled 2015-08-30 (×2): qty 1

## 2015-08-30 MED ORDER — LACTATED RINGERS IV BOLUS (SEPSIS)
300.0000 mL | Freq: Once | INTRAVENOUS | Status: AC
  Administered 2015-08-30: 300 mL via INTRAVENOUS

## 2015-08-30 MED ORDER — IBUPROFEN 600 MG PO TABS
600.0000 mg | ORAL_TABLET | Freq: Four times a day (QID) | ORAL | Status: DC | PRN
  Administered 2015-08-30 – 2015-09-01 (×7): 600 mg via ORAL
  Filled 2015-08-30 (×7): qty 1

## 2015-08-30 MED ORDER — OXYCODONE-ACETAMINOPHEN 5-325 MG PO TABS
1.0000 | ORAL_TABLET | Freq: Four times a day (QID) | ORAL | Status: DC | PRN
Start: 2015-08-30 — End: 2015-09-01
  Administered 2015-08-30 (×3): 1 via ORAL
  Administered 2015-08-31 – 2015-09-01 (×5): 2 via ORAL
  Filled 2015-08-30: qty 2
  Filled 2015-08-30: qty 1
  Filled 2015-08-30 (×4): qty 2
  Filled 2015-08-30: qty 1
  Filled 2015-08-30: qty 2

## 2015-08-30 MED ORDER — FERROUS SULFATE 325 (65 FE) MG PO TABS
325.0000 mg | ORAL_TABLET | Freq: Two times a day (BID) | ORAL | Status: DC
  Administered 2015-08-30 – 2015-09-01 (×4): 325 mg via ORAL
  Filled 2015-08-30 (×4): qty 1

## 2015-08-30 NOTE — Discharge Summary (Signed)
Obstetric Discharge Summary Reason for Admission: onset of labor Prenatal Procedures: NST and ultrasound Intrapartum Procedures: cesarean: low cervical, transverse and placental abruption Postpartum Procedures: none Complications-Operative and Postpartum: none HEMOGLOBIN  Date Value Ref Range Status  08/30/2015 8.3* 12.0 - 16.0 g/dL Final   HCT  Date Value Ref Range Status  08/30/2015 25.3* 35.0 - 47.0 % Final   HEMATOCRIT  Date Value Ref Range Status  11/08/2014 26.8* 34.0 - 46.6 % Final   Postop HCT: 25.3, 24.2  Physical Exam:  General: alert, appears stated age and no distress Lochia: appropriate Uterine Fundus: firm Incision: healing well DVT Evaluation: No evidence of DVT seen on physical exam.  Discharge Diagnoses: Term Pregnancy-delivered, asymptomatic blood loss anemia  Discharge Information: Date: 08/30/2015 Activity: pelvic rest and no driving for 2 weeks or while taking narcotic pain meds, showers no baths, no heavy lifting greater than 10lbs for 6 weeks Diet: routine Medications:   Medication List    STOP taking these medications        metoCLOPramide 10 MG tablet  Commonly known as:  REGLAN      TAKE these medications        ferrous sulfate 325 (65 FE) MG tablet  Take 1 tablet (325 mg total) by mouth 2 (two) times daily with a meal.     ibuprofen 600 MG tablet  Commonly known as:  ADVIL,MOTRIN  Take 1 tablet (600 mg total) by mouth every 6 (six) hours as needed for fever or headache.     oxyCODONE-acetaminophen 5-325 MG tablet  Commonly known as:  PERCOCET/ROXICET  Take 1-2 tablets by mouth every 6 (six) hours as needed for moderate pain or severe pain.     PRENATAL VITAMINS PO  Take 1 tablet by mouth every morning.     senna-docusate 8.6-50 MG tablet  Commonly known as:  Senokot-S  Take 2 tablets by mouth daily as needed for mild constipation.       Contraception: plans for IUD  Condition: stable Discharge to: home   Follow-up: 1 wk  post-op and 6 week postpartum  Newborn Data: Live born female  Birth Weight: 6 lb 5.9 oz (2890 g) APGAR: 8, 9  Home with mother.   Marta Antuamara Haydin Calandra, CNM

## 2015-08-30 NOTE — Anesthesia Postprocedure Evaluation (Signed)
Anesthesia Post Note  Patient: Hailey Martin  Procedure(s) Performed: Procedure(s) (LRB): CESAREAN SECTION (N/A)  Patient location during evaluation: Mother Baby Anesthesia Type: Epidural Level of consciousness: awake, awake and alert and oriented Pain management: pain level controlled Vital Signs Assessment: post-procedure vital signs reviewed and stable Respiratory status: spontaneous breathing and nonlabored ventilation Cardiovascular status: blood pressure returned to baseline Postop Assessment: no headache, no backache, no signs of nausea or vomiting and patient able to bend at knees Anesthetic complications: no    Last Vitals:  Filed Vitals:   08/30/15 0050 08/30/15 0349  BP: 114/60 123/66  Pulse: 80 77  Temp: 36.8 C 36.8 C  Resp: 18 18    Last Pain:  Filed Vitals:   08/30/15 0416  PainSc: 2                  Ginger CarneStephanie Garrus Gauthreaux

## 2015-08-30 NOTE — Progress Notes (Signed)
POD #1 CS for placental abruption Subjective:   Hurts to take a deep breath. Tolerating regular diet. Bottle feeding baby.   Objective:  Blood pressure 117/58, pulse 66, temperature 98 F (36.7 C), temperature source Oral, resp. rate 20, height  (1.676 m), weight 93.895 kg (207 lb), last menstrual period 10/12/2014, SpO2 98 %, unknown if currently breastfeeding. Urine output: 1650 ( emptied this AM), appears dark amber-158ml in bag General: NAD/ Heart: RRR without murmur Pulmonary: no increased work of breathing/ decreased breath sounds in base Abdomen: non-distended, non-tender, fundus firm at level of umbilicus-1FB, hypoactive bowel sounds Incision: dsg C+D+I Extremities: SCDs on  Results for orders placed or performed during the hospital encounter of 08/29/15 (from the past 72 hour(s))  Type and screen [facility]     Status: None   Collection Time: 08/29/15  4:03 AM  Result Value Ref Range   ABO/RH(D) A POS    Antibody Screen NEG    Sample Expiration 09/01/2015   CBC     Status: Abnormal   Collection Time: 08/29/15  4:03 AM  Result Value Ref Range   WBC 13.5 (H) 3.6 - 11.0 K/uL   RBC 3.83 3.80 - 5.20 MIL/uL   Hemoglobin 10.3 (L) 12.0 - 16.0 g/dL   HCT 16.1 (L) 09.6 - 04.5 %   MCV 83.2 80.0 - 100.0 fL   MCH 26.8 26.0 - 34.0 pg   MCHC 32.2 32.0 - 36.0 g/dL   RDW 40.9 81.1 - 91.4 %   Platelets 232 150 - 440 K/uL  RPR     Status: None   Collection Time: 08/29/15  4:03 AM  Result Value Ref Range   RPR Ser Ql Non Reactive Non Reactive    Comment: (NOTE) Performed At: [facility] [address] [place], [place] [id number] Mila Homer MD [id number]   Urine Drug Screen, Qualitative ([facility] only)     Status: Abnormal   Collection Time: 08/29/15 10:47 AM  Result Value Ref Range   Tricyclic, Ur Screen NONE DETECTED NONE DETECTED   Amphetamines, Ur Screen NONE DETECTED NONE DETECTED   MDMA (Ecstasy)Ur Screen NONE DETECTED NONE  DETECTED   Cocaine Metabolite,Ur [place] NONE DETECTED NONE DETECTED   Opiate, Ur Screen POSITIVE (A) NONE DETECTED   Phencyclidine (PCP) Ur S NONE DETECTED NONE DETECTED   Cannabinoid 50 Ng, Ur [place] NONE DETECTED NONE DETECTED   Barbiturates, Ur Screen NONE DETECTED NONE DETECTED   Benzodiazepine, Ur Scrn NONE DETECTED NONE DETECTED   Methadone Scn, Ur NONE DETECTED NONE DETECTED    Comment: (NOTE) 100  Tricyclics, urine               Cutoff 1000 ng/mL 200  Amphetamines, urine             Cutoff 1000 ng/mL 300  MDMA (Ecstasy), urine           Cutoff 500 ng/mL 400  Cocaine Metabolite, urine       Cutoff 300 ng/mL 500  Opiate, urine                   Cutoff 300 ng/mL 600  Phencyclidine (PCP), urine      Cutoff 25 ng/mL 700  Cannabinoid, urine              Cutoff 50 ng/mL 800  Barbiturates, urine             Cutoff 200 ng/mL 900  Benzodiazepine, urine  Cutoff 200 ng/mL 1000 Methadone, urine                Cutoff 300 ng/mL 1100 1200 The urine drug screen provides only a preliminary, unconfirmed 1300 analytical test result and should not be used for non-medical 1400 purposes. Clinical consideration and professional judgment should 1500 be applied to any positive drug screen result due to possible 1600 interfering substances. A more specific alternate chemical method 1700 must be used in order to obtain a confirmed analytical result.  1800 Gas chromato graphy / mass spectrometry (GC/MS) is the preferred 1900 confirmatory method.   Kleihauer-Betke stain     Status: None   Collection Time: 08/29/15  2:42 PM  Result Value Ref Range   Fetal Cells % 0 %   Quantitation Fetal Hemoglobin 0ML mL   # Vials RhIg NOT INDICATED     Comment: CONFIRMED BY [facility]   CBC     Status: Abnormal   Collection Time: 08/30/15  5:56 AM  Result Value Ref Range   WBC 16.9 (H) 3.6 - 11.0 K/uL   RBC 3.05 (L) 3.80 - 5.20 MIL/uL   Hemoglobin 8.3 (L) 12.0 - 16.0 g/dL   HCT 16.125.3 (L) 09.635.0 - 04.547.0 %   MCV 82.9 80.0  - 100.0 fL   MCH 27.2 26.0 - 34.0 pg   MCHC 32.8 32.0 - 36.0 g/dL   RDW 40.914.8 (H) 81.111.5 - 91.414.5 %   Platelets 210 150 - 440 K/uL     Assessment:   20 y.o. G2P1011 postoperativeday #1-stable  Urine appears very concentrated-will bolus with LR 300 ml  Needs to deep breath, cough -incentive spirometer ordered q2hr  Advance diet as tolerated  If urine out put adequate-DC foley after lunch and ambulate   Plan:  1) Acute blood loss anemia - hemodynamically stable and asymptomatic - po ferrous sulfate/ vitamins  2) --/--/A POS (06/06 0403) /RI / Varicella immune  3) TDAP UTD  4)Bottle  5) Disposition

## 2015-08-31 LAB — CBC
HCT: 24.2 % — ABNORMAL LOW (ref 35.0–47.0)
Hemoglobin: 7.7 g/dL — ABNORMAL LOW (ref 12.0–16.0)
MCH: 26.5 pg (ref 26.0–34.0)
MCHC: 32 g/dL (ref 32.0–36.0)
MCV: 83.1 fL (ref 80.0–100.0)
Platelets: 230 10*3/uL (ref 150–440)
RBC: 2.91 MIL/uL — ABNORMAL LOW (ref 3.80–5.20)
RDW: 14.6 % — ABNORMAL HIGH (ref 11.5–14.5)
WBC: 13.6 10*3/uL — ABNORMAL HIGH (ref 3.6–11.0)

## 2015-08-31 NOTE — Progress Notes (Signed)
Post Op Day 2  Subjective:   Pt is ambulating and voiding without difficulty. She is tolerating PO intake and her pain is managed with PO pain med and On Q pump.   Objective:  Blood pressure 118/62, pulse 97, temperature 98.7 F (37.1 C), temperature source Oral, resp. rate 20, height 5\' 6"  (1.676 m), weight 93.895 kg (207 lb), last menstrual period 10/12/2014, SpO2 99 %, Bottle feeding  General: NAD Pulmonary: no increased work of breathing Abdomen: non-distended, non-tender, fundus firm at level of umbilicus Incision: Dressing is C/D/I no drainage, no s/s infection Extremities: no edema, no erythema, no tenderness No HA, change of vision, epigastric pain   @I /O24@   Assessment:   20 y.o. G2P1010 postoperativeday # 2   Plan:  1) Acute blood loss anemia - hemodynamically stable and asymptomatic - po ferrous sulfate  2) CBC  3) A+, RI, VI  4) TDAP UTD   5) Bottle/Contraception  6) Disposition: home day 3 or 4   Megin Consalvo, CNM

## 2015-09-01 LAB — SURGICAL PATHOLOGY

## 2015-09-01 MED ORDER — OXYCODONE-ACETAMINOPHEN 5-325 MG PO TABS: 1.0000 | ORAL_TABLET | Freq: Four times a day (QID) | ORAL | Status: DC | PRN

## 2015-09-01 MED ORDER — FERROUS SULFATE 325 (65 FE) MG PO TABS: 325.0000 mg | ORAL_TABLET | Freq: Two times a day (BID) | ORAL | Status: DC

## 2015-09-01 MED ORDER — SENNOSIDES-DOCUSATE SODIUM 8.6-50 MG PO TABS: 2.0000 | ORAL_TABLET | Freq: Every day | ORAL | Status: DC | PRN

## 2015-09-01 MED ORDER — IBUPROFEN 600 MG PO TABS: 600.0000 mg | ORAL_TABLET | Freq: Four times a day (QID) | ORAL | Status: DC | PRN

## 2015-09-01 NOTE — Progress Notes (Signed)
Discharge instructions complete and prescriptions given. Patient verbalizes understanding of teaching. Patient discharged home at 1500. 

## 2015-09-01 NOTE — Discharge Instructions (Signed)
Discharge instructions:  ° °Call office if you have any of the following: headache, visual changes, fever >100 F, chills, breast concerns, excessive vaginal bleeding, incision drainage or problems, leg pain or redness, depression or any other concerns.  ° °Activity: Do not lift > 10 lbs for 6 weeks.  °No intercourse or tampons for 6 weeks.  °No driving for 1-2 weeks.  ° °

## 2016-08-16 ENCOUNTER — Ambulatory Visit (INDEPENDENT_AMBULATORY_CARE_PROVIDER_SITE_OTHER): Payer: Medicaid Other | Admitting: Obstetrics and Gynecology

## 2016-08-16 ENCOUNTER — Encounter: Payer: Self-pay | Admitting: Obstetrics and Gynecology

## 2016-08-16 ENCOUNTER — Ambulatory Visit (INDEPENDENT_AMBULATORY_CARE_PROVIDER_SITE_OTHER): Payer: Medicaid Other

## 2016-08-16 ENCOUNTER — Other Ambulatory Visit: Payer: Self-pay | Admitting: Obstetrics and Gynecology

## 2016-08-16 VITALS — BP 124/72 | HR 87 | Wt 170.9 lb

## 2016-08-16 DIAGNOSIS — N926 Irregular menstruation, unspecified: Secondary | ICD-10-CM

## 2016-08-16 DIAGNOSIS — Z3490 Encounter for supervision of normal pregnancy, unspecified, unspecified trimester: Secondary | ICD-10-CM

## 2016-08-16 NOTE — Patient Instructions (Signed)
Trial of Labor After Cesarean Delivery A trial of labor after cesarean delivery (TOLAC) is when a woman tries to give birth vaginally after a previous cesarean delivery. TOLAC may be a safe and appropriate option for you depending on your medical history and other risk factors. When TOLAC is successful and you are able to have a vaginal delivery, this is called a vaginal birth after cesarean delivery (VBAC). Candidates for TOLAC TOLAC is possible for some women who:  Have undergone one or two prior cesarean deliveries in which the incision of the uterus was horizontal (low transverse).  Are carrying twins and have had one prior low transverse incision during a cesarean delivery.  Do not have a vertical (classical) uterine scar.  Have not had a tear in the wall of their uterus (uterine rupture). TOLAC is also supported for women who meet appropriate criteria and:  Are under the age of 40 years.  Are tall and have a body mass index (BMI) of less than 30.  Have an unknown uterine scar.  Give birth in a facility equipped to handle an emergency cesarean delivery. This team should be able to handle possible complications such as a uterine rupture.  Have thorough counseling about the benefits and risks of TOLAC.  Have discussed future pregnancy plans with their health care provider.  Plan to have several more pregnancies. Most successful candidates for TOLAC:  Have had a successful vaginal delivery before or after their cesarean delivery.  Experience labor that begins naturally on or before the due date (40 weeks of gestation).  Do not have a very large (macrosomic) baby.  Had a prior cesarean delivery but are not currently experiencing factors that would prompt a cesarean delivery (such as a breech position).  Had only one prior cesarean delivery.  Had a prior cesarean delivery that was performed early in labor and not after full cervical dilation. TOLAC may be most appropriate for  women who meet the above guidelines and who plan to have more pregnancies. TOLAC is not recommended for home births. Least successful candidates for TOLAC:  Have an induced labor with an unfavorable cervix. An unfavorable cervix is when the cervix is not dilating enough (among other factors).  Have never had a vaginal delivery.  Have had more than two cesarean deliveries.  Have a pregnancy at more than 40 weeks of gestation.  Are pregnant with a baby with a suspected weight greater than 4,000 grams (8 pounds) and who have no prior history of a vaginal delivery.  Have closely spaced pregnancies. Suggested benefits of TOLAC  You may have a faster recovery time.  You may have a shorter stay in the hospital.  You may have less pain and fewer problems than with a cesarean delivery. Women who have a cesarean delivery have a higher chance of needing blood or getting a fever, an infection, or a blood clot in the legs. Suggested risks of TOLAC The highest risk of complications happens to women who attempt a TOLAC and fail. A failed TOLAC results in an unplanned cesarean delivery. Risks related to [facility] or repeat cesarean deliveries include:  Blood loss.  Infection.  Blood clot.  Injury to surrounding tissues or organs.  Having to remove the uterus (hysterectomy).  Potential problems with the placenta (such as placenta previa or placenta accreta) in future pregnancies. Although very rare, the main concerns with TOLAC are:  Rupture of the uterine scar from a past cesarean delivery.  Needing an emergency cesarean delivery.  Having a bad outcome for the baby (perinatal morbidity). Where to find more information:  American Congress of Obstetricians and Gynecologists: www.acog.org  [employer] Nurse-Midwives: www.midwife.org This information is not intended to replace advice given to you by your health care provider. Make sure you discuss any questions you have with your  health care provider. Document Released: 11/27/2010 Document Revised: 02/07/2016 Document Reviewed: 08/31/2012 Elsevier Interactive Patient Education  2017 [employer].

## 2016-08-16 NOTE — Progress Notes (Signed)
NOB- confirmation done @ [facility], pt is unsure of LMP," can feel butterflies in her abdomen", is having some vaginal discomfort

## 2016-08-16 NOTE — Progress Notes (Signed)
NEW OB HISTORY AND PHYSICAL  SUBJECTIVE:       Hailey Martin is a 21 y.o. 773P1010 female, Patient's last menstrual period was 04/20/2016., Estimated Date of Delivery: 01/25/17, 3658w6d, presents today for establishment of Prenatal Care. She has no unusual complaints. No menses since Feb, and has felt movement for about a month and a half.       Gynecologic History Patient's last menstrual period was 04/20/2016. Normal Contraception: none Last Pap: none  Obstetric History OB History  Gravida Para Term Preterm AB Living  3 1 1  0 1 0  SAB TAB Ectopic Multiple Live Births  1 0 0 0      # Outcome Date GA Lbr Len/2nd Weight Sex Delivery Anes PTL Lv  3 Current           2 Term 08/29/15 [id number]  6 lb 5.9 oz (2.89 kg) F  Gen    1 SAB               Past Medical History:  Diagnosis Date  . Allergy   . Anemia   . Dysmenorrhea   . Overweight   . Seborrhea     Past Surgical History:  Procedure Laterality Date  . CESAREAN SECTION N/A 08/29/2015   Procedure: CESAREAN SECTION;  Surgeon: Vena AustriaAndreas Staebler, MD;  Location: [facility] ORS;  Service: Obstetrics;  Laterality: N/A;  . NO PAST SURGERIES    . WISDOM TOOTH EXTRACTION      Current Outpatient Prescriptions on File Prior to Visit  Medication Sig Dispense Refill  . Prenatal Multivit-Min-Fe-FA (PRENATAL VITAMINS PO) Take 1 tablet by mouth every morning.     . ferrous sulfate 325 (65 FE) MG tablet Take 1 tablet (325 mg total) by mouth 2 (two) times daily with a meal. (Patient not taking: Reported on 08/16/2016) 60 tablet 3  . ibuprofen (ADVIL,MOTRIN) 600 MG tablet Take 1 tablet (600 mg total) by mouth every 6 (six) hours as needed for fever or headache. (Patient not taking: Reported on 08/16/2016) 30 tablet 0  . senna-docusate (SENOKOT-S) 8.6-50 MG tablet Take 2 tablets by mouth daily as needed for mild constipation. (Patient not taking: Reported on 08/16/2016) 30 tablet 6   No current facility-administered medications on file prior to visit.      No Known Allergies  Social History   Social History  . Marital status: Single    Spouse name: N/A  . Number of children: 0  . Years of education: 12   Occupational History  . Deli  [employer]   Social History Main Topics  . Smoking status: Never Smoker  . Smokeless tobacco: Never Used  . Alcohol use No  . Drug use: No  . Sexual activity: Not Currently    Partners: Male   Other Topics Concern  . Not on file   Social History Narrative  . No narrative on file    Family History  Problem Relation Age of Onset  . Heart failure Father [id number]    The following portions of the patient's history were reviewed and updated as appropriate: allergies, current medications, past OB history, past medical history, past surgical history, past family history, past social history, and problem list.    OBJECTIVE: Initial Physical Exam (New OB)  GENERAL APPEARANCE: alert, well appearing, in no apparent distress, oriented to person, place and time HEAD: normocephalic, atraumatic MOUTH: mucous membranes moist, pharynx normal without lesions and dental hygiene good THYROID: no thyromegaly or masses present BREASTS: not examined  LUNGS: clear to auscultation, no wheezes, rales or rhonchi, symmetric air entry HEART: regular rate and rhythm, no murmurs ABDOMEN: soft, nontender, nondistended, no abnormal masses, no epigastric pain EXTREMITIES: no redness or tenderness in the calves or thighs SKIN: normal coloration and turgor, no rashes LYMPH NODES: no adenopathy palpable NEUROLOGIC: alert, oriented, normal speech, no focal findings or movement disorder noted  PELVIC EXAM EXTERNAL GENITALIA: normal appearing vulva with no masses, tenderness or lesions VAGINA: no abnormal discharge or lesions CERVIX: no lesions or cervical motion tenderness and discharge thin white Microscopic wet-mount exam shows negative for pathogens, normal epithelial cells. UTERUS: gravid and consistent with 18  weeks  Indications:Anatomy U/S Findings:  Singleton intrauterine pregnancy is visualized with FHR at 157 BPM. Biometrics give an (U/S) Gestational age of 61 0/7 weeks and an (U/S) EDD of 01/24/17;   Fetal presentation is Variable.  EFW: 177g (6oz). Placenta: Anterior, grade 0, 3.8 cm from internal os. AFI: Adequate with MVP of 3.3 cm.  Anatomic survey is incomplete and normal; Gender - female  .  The follwing fetal anatomy was not seen due to early gestational age and fetal position: All Fetal Heart Views, Ductal Arch, Aortic Arch, Spine, Kidneys, and Profile.  Right Ovary measures 2.2 x 1.8 x 3.0 cm. It is normal in appearance. Left Ovary measures 3 x 1.6 x 2.4 cm. It is normal appearance. There is no evidence of a corpus luteal cyst.  Survey of the adnexa demonstrates no adnexal masses. There is no free peritoneal fluid in the cul de sac.  Impression: 1. 17 0/7 week Viable Singleton Intrauterine pregnancy by U/S. 2. (U/S) EDD is consistent with Clinically established (LMP) EDD of 01/24/17. 3. Incomplete Anatomy Scan  ASSESSMENT: Normal pregnancy Late entry to care Prev.c/s at term for placenta abruption PLAN: Prenatal care Will do scan today for dating and follow up accordingly See orders

## 2016-08-17 LAB — CBC WITH DIFFERENTIAL/PLATELET
Basophils Absolute: 0 10*3/uL (ref 0.0–0.2)
Basos: 0 %
EOS (ABSOLUTE): 0 10*3/uL (ref 0.0–0.4)
Eos: 0 %
Hematocrit: 27.8 % — ABNORMAL LOW (ref 34.0–46.6)
Hemoglobin: 7.8 g/dL — ABNORMAL LOW (ref 11.1–15.9)
Immature Grans (Abs): 0 10*3/uL (ref 0.0–0.1)
Immature Granulocytes: 0 %
Lymphocytes Absolute: 1.7 10*3/uL (ref 0.7–3.1)
Lymphs: 17 %
MCH: 18 pg — ABNORMAL LOW (ref 26.6–33.0)
MCHC: 28.1 g/dL — ABNORMAL LOW (ref 31.5–35.7)
MCV: 64 fL — ABNORMAL LOW (ref 79–97)
Monocytes Absolute: 0.6 10*3/uL (ref 0.1–0.9)
Monocytes: 6 %
Neutrophils Absolute: 7.7 10*3/uL — ABNORMAL HIGH (ref 1.4–7.0)
Neutrophils: 77 %
Platelets: 374 10*3/uL (ref 150–379)
RBC: 4.33 x10E6/uL (ref 3.77–5.28)
RDW: 20.7 % — ABNORMAL HIGH (ref 12.3–15.4)
WBC: 10 10*3/uL (ref 3.4–10.8)

## 2016-08-17 LAB — ABO AND RH: Rh Factor: POSITIVE

## 2016-08-17 LAB — HIV ANTIBODY (ROUTINE TESTING W REFLEX): HIV Screen 4th Generation wRfx: NONREACTIVE

## 2016-08-17 LAB — HEPATITIS B SURFACE ANTIGEN: Hepatitis B Surface Ag: NEGATIVE

## 2016-08-17 LAB — ANTIBODY SCREEN: Antibody Screen: NEGATIVE

## 2016-08-17 LAB — SICKLE CELL SCREEN: Sickle Cell Screen: NEGATIVE

## 2016-08-17 LAB — RPR: RPR Ser Ql: NONREACTIVE

## 2016-08-17 LAB — RUBELLA SCREEN: Rubella Antibodies, IGG: 6.39 index (ref >0.99)

## 2016-08-17 LAB — VARICELLA ZOSTER ANTIBODY, IGG: Varicella zoster IgG: 1201 index (ref >165)

## 2016-08-17 LAB — BETA HCG QUANT (REF LAB): hCG Quant: 28909 m[IU]/mL

## 2016-08-18 LAB — GC/CHLAMYDIA PROBE AMP
Chlamydia trachomatis, NAA: NEGATIVE
Neisseria gonorrhoeae by PCR: NEGATIVE

## 2016-08-18 LAB — URINE CULTURE

## 2016-08-20 ENCOUNTER — Other Ambulatory Visit: Payer: Self-pay | Admitting: Obstetrics and Gynecology

## 2016-08-20 ENCOUNTER — Telehealth: Payer: Self-pay | Admitting: Certified Nurse Midwife

## 2016-08-20 NOTE — Telephone Encounter (Signed)
Patient would like a call with lab results

## 2016-08-21 ENCOUNTER — Encounter: Payer: Self-pay | Admitting: Certified Nurse Midwife

## 2016-08-21 NOTE — Telephone Encounter (Signed)
Mailed info to pt 

## 2016-08-21 NOTE — Telephone Encounter (Signed)
-----   Message from [place]om[place]ody N Sham[place], [place] sent at 08/20/2016  3:19 PM EDT ----- Please let her know labs look fine, except for the anemia, placed a few samples of a PNV with iron in it I want there to switch too.

## 2016-08-22 LAB — URINALYSIS, ROUTINE W REFLEX MICROSCOPIC
Bilirubin, UA: NEGATIVE
Glucose, UA: NEGATIVE
Ketones, UA: NEGATIVE
Nitrite, UA: NEGATIVE
RBC, UA: NEGATIVE
Specific Gravity, UA: 1.028 (ref 1.005–1.030)
Urobilinogen, Ur: 1 mg/dL (ref 0.2–1.0)
pH, UA: 8 — ABNORMAL HIGH (ref 5.0–7.5)

## 2016-08-22 LAB — MONITOR DRUG PROFILE 14(MW)
Amphetamine Scrn, Ur: NEGATIVE ng/mL
BARBITURATE SCREEN URINE: NEGATIVE ng/mL
BENZODIAZEPINE SCREEN, URINE: NEGATIVE ng/mL
Buprenorphine, Urine: NEGATIVE ng/mL
Cocaine (Metab) Scrn, Ur: NEGATIVE ng/mL
Creatinine(Crt), U: 173.1 mg/dL (ref 20.0–300.0)
Fentanyl, Urine: NEGATIVE pg/mL
Meperidine Screen, Urine: NEGATIVE ng/mL
Methadone Screen, Urine: NEGATIVE ng/mL
OXYCODONE+OXYMORPHONE UR QL SCN: NEGATIVE ng/mL
Opiate Scrn, Ur: NEGATIVE ng/mL
Ph of Urine: 7.7 (ref 4.5–8.9)
Phencyclidine Qn, Ur: NEGATIVE ng/mL
Propoxyphene Scrn, Ur: NEGATIVE ng/mL
SPECIFIC GRAVITY: 1.027
Tramadol Screen, Urine: NEGATIVE ng/mL

## 2016-08-22 LAB — MICROSCOPIC EXAMINATION
Casts: NONE SEEN /lpf
Epithelial Cells (non renal): >10 /hpf — AB (ref 0–10)
RBC, UA: NONE SEEN /hpf (ref 0–?)

## 2016-08-22 LAB — CANNABINOID (GC/MS), URINE
Cannabinoid: POSITIVE — AB
Carboxy THC (GC/MS): 229 ng/mL

## 2016-08-22 LAB — CYTOLOGY - PAP

## 2016-08-23 ENCOUNTER — Other Ambulatory Visit: Payer: Self-pay | Admitting: Obstetrics and Gynecology

## 2016-08-23 DIAGNOSIS — F1290 Cannabis use, unspecified, uncomplicated: Secondary | ICD-10-CM | POA: Insufficient documentation

## 2016-09-12 ENCOUNTER — Other Ambulatory Visit (INDEPENDENT_AMBULATORY_CARE_PROVIDER_SITE_OTHER): Payer: Medicaid Other

## 2016-09-12 ENCOUNTER — Other Ambulatory Visit: Payer: Self-pay | Admitting: Certified Nurse Midwife

## 2016-09-12 ENCOUNTER — Ambulatory Visit (INDEPENDENT_AMBULATORY_CARE_PROVIDER_SITE_OTHER): Payer: Medicaid Other | Admitting: Certified Nurse Midwife

## 2016-09-12 ENCOUNTER — Encounter: Payer: Self-pay | Admitting: Certified Nurse Midwife

## 2016-09-12 VITALS — BP 112/66 | HR 85 | Wt 174.6 lb

## 2016-09-12 DIAGNOSIS — O26892 Other specified pregnancy related conditions, second trimester: Secondary | ICD-10-CM

## 2016-09-12 DIAGNOSIS — Z048 Encounter for examination and observation for other specified reasons: Secondary | ICD-10-CM | POA: Diagnosis not present

## 2016-09-12 DIAGNOSIS — Z3482 Encounter for supervision of other normal pregnancy, second trimester: Secondary | ICD-10-CM | POA: Diagnosis not present

## 2016-09-12 DIAGNOSIS — IMO0002 Reserved for concepts with insufficient information to code with codable children: Secondary | ICD-10-CM

## 2016-09-12 DIAGNOSIS — Z0489 Encounter for examination and observation for other specified reasons: Secondary | ICD-10-CM

## 2016-09-12 DIAGNOSIS — Z3492 Encounter for supervision of normal pregnancy, unspecified, second trimester: Secondary | ICD-10-CM

## 2016-09-12 DIAGNOSIS — O0930 Supervision of pregnancy with insufficient antenatal care, unspecified trimester: Secondary | ICD-10-CM

## 2016-09-12 DIAGNOSIS — N898 Other specified noninflammatory disorders of vagina: Secondary | ICD-10-CM

## 2016-09-12 DIAGNOSIS — Z98891 History of uterine scar from previous surgery: Secondary | ICD-10-CM

## 2016-09-12 LAB — POCT URINALYSIS DIPSTICK
Bilirubin, UA: NEGATIVE
Blood, UA: NEGATIVE
Glucose, UA: NEGATIVE
Ketones, UA: NEGATIVE
Nitrite, UA: NEGATIVE
Spec Grav, UA: 1.01 (ref 1.010–1.025)
Urobilinogen, UA: NEGATIVE E.U./dL — AB
pH, UA: 6 (ref 5.0–8.0)

## 2016-09-12 MED ORDER — TERCONAZOLE 0.4 % VA CREA: 1.0000 | TOPICAL_CREAM | Freq: Every day | VAGINAL | 0 refills | Status: DC

## 2016-09-12 NOTE — Progress Notes (Signed)
ROB-Reports increased vaginal discharge and labial itching for the last few days. NuSwab collected, will notify patient with results. Rx Terazol, see orders. Follow up anatomy scan scheduled after today's appointment. Reviewed red flag symptoms and when to call. RTC x 4 weeks for ROB.   ULTRASOUND REPORT  Location: [facility] Date of Service: 09/12/16  Indications:F/U Anatomy Findings:  [place] JimSingleton intrauterine pregnancy is visualized with FHR at 149 BPM. Biometrics give an (U/S) Gestational age of 22 2/7 weeks and an (U/S) EDD of 01/28/17; this correlates with the clinically established EDD of 01/24/17.  Fetal presentation is Breech.  EFW: 323g (11oz). Placenta: Anterior, grade 0, remote to cervix. AFI: Adequate with MVP of 4.2 cm.  Anatomic survey is complete with the acquisition of All heart views, Ductal and Aortic Arch, Profile, spine and kidneys; Gender - female  . Stomach and bladder are visualized. All visualized anatomy appears normal.  Ovaries are not seen Survey of the adnexa demonstrates no adnexal masses. There is no free peritoneal fluid in the cul de sac.  Impression: 1. 20 2/7 week Viable Singleton Intrauterine pregnancy by U/S. 2. (U/S) EDD is consistent with Clinically established (LMP) EDD of 01/24/17. 3. Normal follow up Anatomy Scan  Recommendations: 1.Clinical correlation with the patient's History and Physical Exam.

## 2016-09-12 NOTE — Patient Instructions (Signed)
Round Ligament Pain The round ligament is a cord of muscle and tissue that helps to support the uterus. It can become a source of pain during pregnancy if it becomes stretched or twisted as the baby grows. The pain usually begins in the second trimester of pregnancy, and it can come and go until the baby is delivered. It is not a serious problem, and it does not cause harm to the baby. Round ligament pain is usually a short, sharp, and pinching pain, but it can also be a dull, lingering, and aching pain. The pain is felt in the lower side of the abdomen or in the groin. It usually starts deep in the groin and moves up to the outside of the hip area. Pain can occur with:  A sudden change in position.  Rolling over in bed.  Coughing or sneezing.  Physical activity.  Follow these instructions at home: Watch your condition for any changes. Take these steps to help with your pain:  When the pain starts, relax. Then try: ? Sitting down. ? Flexing your knees up to your abdomen. ? Lying on your side with one pillow under your abdomen and another pillow between your legs. ? Sitting in a warm bath for 15-20 minutes or until the pain goes away.  Take over-the-counter and prescription medicines only as told by your health care provider.  Move slowly when you sit and stand.  Avoid long walks if they cause pain.  Stop or lessen your physical activities if they cause pain.  Contact a health care provider if:  Your pain does not go away with treatment.  You feel pain in your back that you did not have before.  Your medicine is not helping. Get help right away if:  You develop a fever or chills.  You develop uterine contractions.  You develop vaginal bleeding.  You develop nausea or vomiting.  You develop diarrhea.  You have pain when you urinate. This information is not intended to replace advice given to you by your health care provider. Make sure you discuss any questions you have  with your health care provider. Document Released: 12/19/2007 Document Revised: 08/17/2015 Document Reviewed: 05/18/2014 Elsevier Interactive Patient Education  2018 [place] of Pregnancy The second trimester is from week 14 through week 27 (months 4 through 6). The second trimester is often a time when you feel your best. Your body has adjusted to being pregnant, and you begin to feel better physically. Usually, morning sickness has lessened or quit completely, you may have more energy, and you may have an increase in appetite. The second trimester is also a time when the fetus is growing rapidly. At the end of the sixth month, the fetus is about 9 inches long and weighs about 1 pounds. You will likely begin to feel the baby move (quickening) between 16 and 20 weeks of pregnancy. Body changes during your second trimester Your body continues to go through many changes during your second trimester. The changes vary from woman to woman.  Your weight will continue to increase. You will notice your lower abdomen bulging out.  You may begin to get stretch marks on your hips, abdomen, and breasts.  You may develop headaches that can be relieved by medicines. The medicines should be approved by your health care provider.  You may urinate more often because the fetus is pressing on your bladder.  You may develop or continue to have heartburn as a result of your  pregnancy.  You may develop constipation because certain hormones are causing the muscles that push waste through your intestines to slow down.  You may develop hemorrhoids or swollen, bulging veins (varicose veins).  You may have back pain. This is caused by: ? Weight gain. ? Pregnancy hormones that are relaxing the joints in your pelvis. ? A shift in weight and the muscles that support your balance.  Your breasts will continue to grow and they will continue to become tender.  Your gums may bleed and may be  sensitive to brushing and flossing.  Dark spots or blotches (chloasma, mask of pregnancy) may develop on your face. This will likely fade after the baby is born.  A dark line from your belly button to the pubic area (linea nigra) may appear. This will likely fade after the baby is born.  You may have changes in your hair. These can include thickening of your hair, rapid growth, and changes in texture. Some women also have hair loss during or after pregnancy, or hair that feels dry or thin. Your hair will most likely return to normal after your baby is born.  What to expect at prenatal visits During a routine prenatal visit:  You will be weighed to make sure you and the fetus are growing normally.  Your blood pressure will be taken.  Your abdomen will be measured to track your baby's growth.  The fetal heartbeat will be listened to.  Any test results from the previous visit will be discussed.  Your health care provider may ask you:  How you are feeling.  If you are feeling the baby move.  If you have had any abnormal symptoms, such as leaking fluid, bleeding, severe headaches, or abdominal cramping.  If you are using any tobacco products, including cigarettes, chewing tobacco, and electronic cigarettes.  If you have any questions.  Other tests that may be performed during your second trimester include:  Blood tests that check for: ? Low iron levels (anemia). ? High blood sugar that affects pregnant women (gestational diabetes) between 67 and 28 weeks. ? Rh antibodies. This is to check for a protein on red blood cells (Rh factor).  Urine tests to check for infections, diabetes, or protein in the urine.  An ultrasound to confirm the proper growth and development of the baby.  An amniocentesis to check for possible genetic problems.  Fetal screens for spina bifida and Down syndrome.  HIV (human immunodeficiency virus) testing. Routine prenatal testing includes screening  for HIV, unless you choose not to have this test.  Follow these instructions at home: Medicines  Follow your health care provider's instructions regarding medicine use. Specific medicines may be either safe or unsafe to take during pregnancy.  Take a prenatal vitamin that contains at least 600 micrograms (mcg) of folic acid.  If you develop constipation, try taking a stool softener if your health care provider approves. Eating and drinking  Eat a balanced diet that includes fresh fruits and vegetables, whole grains, good sources of protein such as meat, eggs, or tofu, and low-fat dairy. Your health care provider will help you determine the amount of weight gain that is right for you.  Avoid raw meat and uncooked cheese. These carry germs that can cause birth defects in the baby.  If you have low calcium intake from food, talk to your health care provider about whether you should take a daily calcium supplement.  Limit foods that are high in fat and processed sugars,  such as fried and sweet foods.  To prevent constipation: ? Drink enough fluid to keep your urine clear or pale yellow. ? Eat foods that are high in fiber, such as fresh fruits and vegetables, whole grains, and beans. Activity  Exercise only as directed by your health care provider. Most women can continue their usual exercise routine during pregnancy. Try to exercise for 30 minutes at least 5 days a week. Stop exercising if you experience uterine contractions.  Avoid heavy lifting, wear low heel shoes, and practice good posture.  A sexual relationship may be continued unless your health care provider directs you otherwise. Relieving pain and discomfort  Wear a good support bra to prevent discomfort from breast tenderness.  Take warm sitz baths to soothe any pain or discomfort caused by hemorrhoids. Use hemorrhoid cream if your health care provider approves.  Rest with your legs elevated if you have leg cramps or low  back pain.  If you develop varicose veins, wear support hose. Elevate your feet for 15 minutes, 3-4 times a day. Limit salt in your diet. Prenatal Care  Write down your questions. Take them to your prenatal visits.  Keep all your prenatal visits as told by your health care provider. This is important. Safety  Wear your seat belt at all times when driving.  Make a list of emergency phone numbers, including numbers for family, friends, the hospital, and police and fire departments. General instructions  Ask your health care provider for a referral to a local prenatal education class. Begin classes no later than the beginning of month 6 of your pregnancy.  Ask for help if you have counseling or nutritional needs during pregnancy. Your health care provider can offer advice or refer you to specialists for help with various needs.  Do not use hot tubs, steam rooms, or saunas.  Do not douche or use tampons or scented sanitary pads.  Do not cross your legs for long periods of time.  Avoid cat litter boxes and soil used by cats. These carry germs that can cause birth defects in the baby and possibly loss of the fetus by miscarriage or stillbirth.  Avoid all smoking, herbs, alcohol, and unprescribed drugs. Chemicals in these products can affect the formation and growth of the baby.  Do not use any products that contain nicotine or tobacco, such as cigarettes and e-cigarettes. If you need help quitting, ask your health care provider.  Visit your dentist if you have not gone yet during your pregnancy. Use a soft toothbrush to brush your teeth and be gentle when you floss. Contact a health care provider if:  You have dizziness.  You have mild pelvic cramps, pelvic pressure, or nagging pain in the abdominal area.  You have persistent nausea, vomiting, or diarrhea.  You have a bad smelling vaginal discharge.  You have pain when you urinate. Get help right away if:  You have a  fever.  You are leaking fluid from your vagina.  You have spotting or bleeding from your vagina.  You have severe abdominal cramping or pain.  You have rapid weight gain or weight loss.  You have shortness of breath with chest pain.  You notice sudden or extreme swelling of your face, hands, ankles, feet, or legs.  You have not felt your baby move in over an hour.  You have severe headaches that do not go away when you take medicine.  You have vision changes. Summary  The second trimester is from week  14 through week 27 (months 4 through 6). It is also a time when the fetus is growing rapidly.  Your body goes through many changes during pregnancy. The changes vary from woman to woman.  Avoid all smoking, herbs, alcohol, and unprescribed drugs. These chemicals affect the formation and growth your baby.  Do not use any tobacco products, such as cigarettes, chewing tobacco, and e-cigarettes. If you need help quitting, ask your health care provider.  Contact your health care provider if you have any questions. Keep all prenatal visits as told by your health care provider. This is important. This information is not intended to replace advice given to you by your health care provider. Make sure you discuss any questions you have with your health care provider. Document Released: 03/05/2001 Document Revised: 08/17/2015 Document Reviewed: 05/12/2012 Elsevier Interactive Patient Education  2017 [place]. Common Medications Safe in Pregnancy  Acne:      Constipation:  Benzoyl Peroxide     Colace  Clindamycin      Dulcolax Suppository  Topica Erythromycin     Fibercon  Salicylic Acid      Metamucil         Miralax AVOID:        Senakot   Accutane    Cough:  Retin-A       Cough Drops  Tetracycline      Phenergan w/ Codeine if Rx  Minocycline      Robitussin (Plain &  DM)  Antibiotics:     Crabs/Lice:  Ceclor       RID  Cephalosporins    AVOID:  E-Mycins      Kwell  Keflex  Macrobid/Macrodantin   Diarrhea:  Penicillin      Kao-Pectate  Zithromax      Imodium AD         PUSH FLUIDS AVOID:       Cipro     Fever:  Tetracycline      Tylenol (Regular or Extra  Minocycline       Strength)  Levaquin      Extra Strength-Do not          Exceed 8 tabs/24 hrs Caffeine:        <262m/day (equiv. To 1 cup of coffee or  approx. 3 12 oz sodas)         Gas: Cold/Hayfever:       Gas-X  Benadryl      Mylicon  Claritin       Phazyme  **Claritin-D        Chlor-Trimeton    Headaches:  Dimetapp      ASA-Free Excedrin  Drixoral-Non-Drowsy     Cold Compress  Mucinex (Guaifenasin)     Tylenol (Regular or Extra  Sudafed/Sudafed-12 Hour     Strength)  **Sudafed PE Pseudoephedrine   Tylenol Cold & Sinus     Vicks Vapor Rub  Zyrtec  **AVOID if Problems With Blood Pressure         Heartburn: Avoid lying down for at least 1 hour after meals  Aciphex      Maalox     Rash:  Milk of Magnesia     Benadryl    Mylanta       1% Hydrocortisone Cream  Pepcid  Pepcid Complete   Sleep Aids:  Prevacid      Ambien   Prilosec       Benadryl  Rolaids       Chamomile Tea  Tums (Limit  4/day)     Unisom  Zantac       Tylenol PM         Warm milk-add vanilla or  Hemorrhoids:       Sugar for taste  Anusol/Anusol H.C.  (RX: Analapram 2.5%)  Sugar Substitutes:  Hydrocortisone OTC     Ok in moderation  Preparation H      Tucks        Vaseline lotion applied to tissue with wiping    Herpes:     Throat:  Acyclovir      Oragel  Famvir  Valtrex     Vaccines:         Flu Shot Leg Cramps:       *Gardasil  Benadryl      Hepatitis A         Hepatitis B Nasal Spray:       Pneumovax  Saline Nasal Spray     Polio Booster         Tetanus Nausea:       Tuberculosis test or PPD  Vitamin B6 25 mg TID   AVOID:    Dramamine      *Gardasil  Emetrol       Live  Poliovirus  Ginger Root 250 mg QID    MMR (measles, mumps &  High Complex Carbs @ Bedtime    rebella)  Sea Bands-Accupressure    Varicella (Chickenpox)  Unisom 1/2 tab TID     *No known complications           If received before Pain:         Known pregnancy;   Darvocet       Resume series after  Lortab        Delivery  Percocet    Yeast:   Tramadol      Femstat  Tylenol 3      Gyne-lotrimin  Ultram       Monistat  Vicodin           MISC:         All Sunscreens           Hair Coloring/highlights          Insect Repellant's          (Including DEET)         Mystic Tans Vaginitis Vaginitis is a condition in which the vaginal tissue swells and becomes red (inflamed). This condition is most often caused by a change in the normal balance of bacteria and yeast that live in the vagina. This change causes an overgrowth of certain bacteria or yeast, which causes the inflammation. There are different types of vaginitis, but the most common types are:  Bacterial vaginosis.  Yeast infection (candidiasis).  Trichomoniasis vaginitis. This is a sexually transmitted disease (STD).  Viral vaginitis.  Atrophic vaginitis.  Allergic vaginitis.  What are the causes? The cause of this condition depends on the type of vaginitis. It can be caused by:  Bacteria (bacterial vaginosis).  Yeast, which is a fungus (yeast infection).  A parasite (trichomoniasis vaginitis).  A virus (viral vaginitis).  Low hormone levels (atrophic vaginitis). Low hormone levels can occur during pregnancy, breastfeeding, or after menopause.  Irritants, such as bubble baths, scented tampons, and feminine sprays (allergic vaginitis).  Other factors can change the normal balance of the yeast and bacteria that live in the vagina. These include:  Antibiotic medicines.  Poor hygiene.  Diaphragms, vaginal sponges, spermicides, birth control pills,  and intrauterine devices (IUD).  Sex.  Infection.  Uncontrolled  diabetes.  A weakened defense (immune) system.  What increases the risk? This condition is more likely to develop in women who:  Smoke.  Use vaginal douches, scented tampons, or scented sanitary pads.  Wear tight-fitting pants.  Wear thong underwear.  Use oral birth control pills or an IUD.  Have sex without a condom.  Have multiple sex partners.  Have an STD.  Frequently use the spermicide nonoxynol-9.  Eat lots of foods high in sugar.  Have uncontrolled diabetes.  Have low estrogen levels.  Have a weakened immune system from an immune disorder or medical treatment.  Are pregnant or breastfeeding.  What are the signs or symptoms? Symptoms vary depending on the cause of the vaginitis. Common symptoms include:  Abnormal vaginal discharge. ? The discharge is white, gray, or yellow with bacterial vaginosis. ? The discharge is thick, white, and cheesy with a yeast infection. ? The discharge is frothy and yellow or greenish with trichomoniasis.  A bad vaginal smell. The smell is fishy with bacterial vaginosis.  Vaginal itching, pain, or swelling.  Sex that is painful.  Pain or burning when urinating.  Sometimes there are no symptoms. How is this diagnosed? This condition is diagnosed based on your symptoms and medical history. A physical exam, including a pelvic exam, will also be done. You may also have other tests, including:  Tests to determine the pH level (acidity or alkalinity) of your vagina.  A whiff test, to assess the odor that results when a sample of your vaginal discharge is mixed with a potassium hydroxide solution.  Tests of vaginal fluid. A sample will be examined under a microscope.  How is this treated? Treatment varies depending on the type of vaginitis you have. Your treatment may include:  Antibiotic creams or pills to treat bacterial vaginosis and trichomoniasis.  Antifungal medicines, such as vaginal creams or suppositories, to treat  a yeast infection.  Medicine to ease discomfort if you have viral vaginitis. Your sexual partner should also be treated.  Estrogen delivered in a cream, pill, suppository, or vaginal ring to treat atrophic vaginitis. If vaginal dryness occurs, lubricants and moisturizing creams may help. You may need to avoid scented soaps, sprays, or douches.  Stopping use of a product that is causing allergic vaginitis. Then using a vaginal cream to treat the symptoms.  Follow these instructions at home: Lifestyle  Keep your genital area clean and dry. Avoid soap, and only rinse the area with water.  Do not douche or use tampons until your health care provider says it is okay to do so. Use sanitary pads, if needed.  Do not have sex until your health care provider approves. When you can return to sex, practice safe sex and use condoms.  Wipe from front to back. This avoids the spread of bacteria from the rectum to the vagina. General instructions  Take over-the-counter and prescription medicines only as told by your health care provider.  If you were prescribed an antibiotic medicine, take or use it as told by your health care provider. Do not stop taking or using the antibiotic even if you start to feel better.  Keep all follow-up visits as told by your health care provider. This is important. How is this prevented?  Use mild, non-scented products. Do not use things that can irritate the vagina, such as fabric softeners. Avoid the following products if they are scented: ? Feminine sprays. ? Detergents. ?  Tampons. ? Feminine hygiene products. ? Soaps or bubble baths.  Let air reach your genital area. ? Wear cotton underwear to reduce moisture buildup. ? Avoid wearing underwear while you sleep. ? Avoid wearing tight pants and underwear or nylons without a cotton panel. ? Avoid wearing thong underwear.  Take off any wet clothing, such as bathing suits, as soon as possible.  Practice safe sex  and use condoms. Contact a health care provider if:  You have abdominal pain.  You have a fever.  You have symptoms that last for more than 2-3 days. Get help right away if:  You have a fever and your symptoms suddenly get worse. Summary  Vaginitis is a condition in which the vaginal tissue becomes inflamed.This condition is most often caused by a change in the normal balance of bacteria and yeast that live in the vagina.  Treatment varies depending on the type of vaginitis you have.  Do not douche, use tampons , or have sex until your health care provider approves. When you can return to sex, practice safe sex and use condoms. This information is not intended to replace advice given to you by your health care provider. Make sure you discuss any questions you have with your health care provider. Document Released: 01/06/2007 Document Revised: 04/16/2016 Document Reviewed: 04/16/2016 Elsevier Interactive Patient Education  [employer].

## 2016-09-13 ENCOUNTER — Encounter: Payer: Medicaid Other | Admitting: Certified Nurse Midwife

## 2016-09-15 LAB — NUSWAB VAGINITIS (VG)
Atopobium vaginae: HIGH Score — AB
BVAB 2: HIGH Score — AB
Candida albicans, NAA: POSITIVE — AB
Candida glabrata, NAA: NEGATIVE
Megasphaera 1: HIGH Score — AB
Trich vag by NAA: NEGATIVE

## 2016-09-17 ENCOUNTER — Other Ambulatory Visit: Payer: Self-pay | Admitting: Certified Nurse Midwife

## 2016-09-17 MED ORDER — METRONIDAZOLE 500 MG PO TABS: 500.0000 mg | ORAL_TABLET | Freq: Two times a day (BID) | ORAL | 0 refills | Status: AC

## 2016-10-11 ENCOUNTER — Ambulatory Visit (INDEPENDENT_AMBULATORY_CARE_PROVIDER_SITE_OTHER): Payer: Medicaid Other | Admitting: Certified Nurse Midwife

## 2016-10-11 VITALS — BP 115/68 | HR 84 | Wt 177.7 lb

## 2016-10-11 DIAGNOSIS — Z3493 Encounter for supervision of normal pregnancy, unspecified, third trimester: Secondary | ICD-10-CM

## 2016-10-11 LAB — POCT URINALYSIS DIPSTICK
Bilirubin, UA: NEGATIVE
Blood, UA: NEGATIVE
Glucose, UA: NEGATIVE
Ketones, UA: NEGATIVE
Leukocytes, UA: NEGATIVE
Nitrite, UA: NEGATIVE
Spec Grav, UA: <=1.005 — AB (ref 1.010–1.025)
Urobilinogen, UA: NEGATIVE E.U./dL — AB
pH, UA: 7.5 (ref 5.0–8.0)

## 2016-10-11 NOTE — Progress Notes (Signed)
ROB, doing well. No complaintes. Discussed I hr GTT, CBC, Tdap , and BTC at next visit. Follow up 4 wks.   Doreene BurkeAnnie Shaniyah Wix, CNM

## 2016-10-11 NOTE — Patient Instructions (Signed)

## 2016-10-11 NOTE — Progress Notes (Signed)
No complaints

## 2016-10-18 ENCOUNTER — Observation Stay
Admission: EM | Admit: 2016-10-18 | Discharge: 2016-10-18 | Disposition: A | Discharge status: Home / Self Care | Payer: Medicaid Other | Attending: Obstetrics and Gynecology | Admitting: Obstetrics and Gynecology

## 2016-10-18 ENCOUNTER — Encounter: Payer: Medicaid Other | Admitting: Certified Nurse Midwife

## 2016-10-18 DIAGNOSIS — R109 Unspecified abdominal pain: Principal | ICD-10-CM | POA: Insufficient documentation

## 2016-10-18 DIAGNOSIS — Z3A26 26 weeks gestation of pregnancy: Secondary | ICD-10-CM | POA: Insufficient documentation

## 2016-10-18 NOTE — OB Triage Note (Signed)
Pt presents from ED c/o of upper abdominal cramping every 5 minutes and has been cramping for a few months. Pt denies and VB or LOF.

## 2016-10-18 NOTE — Discharge Instructions (Signed)

## 2016-10-24 NOTE — OB Triage Provider Note (Signed)
L&D OB Triage Note  Johnney KillianJayla R Masser is a 21 y.o. [id number] female at 793w5d, EDD Estimated Date of Delivery: 01/25/17 who presented to triage for complaints of irregular abdominal cramping all night.  She was evaluated by the nurses with no significant findings/findings significant for preterm labor. Vital signs stable. An NST was performed and has been reviewed by Me. She was treated with po hydration.   NST INTERPRETATION: Indications: rule out uterine contractions  Mode: External Baseline Rate (A): 150 bpm Variability: Moderate Accelerations: 15 x 15 Decelerations: None     Contraction Frequency (min): 0  Impression: reactive   Plan: NST performed was reviewed and was found to be reactive. She was discharged home with bleeding/labor precautions.  Continue routine prenatal care. Follow up with OB/GYN as previously scheduled.     Heidi Lemay Suzan NailerN Alexzander Dolinger, CNM

## 2016-11-08 ENCOUNTER — Other Ambulatory Visit: Payer: Medicaid Other

## 2016-11-08 ENCOUNTER — Encounter: Payer: Medicaid Other | Admitting: Certified Nurse Midwife

## 2016-11-11 ENCOUNTER — Other Ambulatory Visit: Payer: Medicaid Other

## 2016-11-11 ENCOUNTER — Encounter: Payer: Medicaid Other | Admitting: Certified Nurse Midwife

## 2016-11-14 ENCOUNTER — Encounter: Payer: Self-pay | Admitting: Certified Nurse Midwife

## 2016-11-14 ENCOUNTER — Ambulatory Visit (INDEPENDENT_AMBULATORY_CARE_PROVIDER_SITE_OTHER): Payer: Medicaid Other | Admitting: Certified Nurse Midwife

## 2016-11-14 ENCOUNTER — Other Ambulatory Visit: Payer: Medicaid Other

## 2016-11-14 VITALS — BP 121/62 | HR 90 | Wt 183.1 lb

## 2016-11-14 DIAGNOSIS — Z23 Encounter for immunization: Secondary | ICD-10-CM | POA: Diagnosis not present

## 2016-11-14 DIAGNOSIS — Z3493 Encounter for supervision of normal pregnancy, unspecified, third trimester: Secondary | ICD-10-CM

## 2016-11-14 DIAGNOSIS — Z131 Encounter for screening for diabetes mellitus: Secondary | ICD-10-CM

## 2016-11-14 DIAGNOSIS — Z130 Encounter for screening for diseases of the blood and blood-forming organs and certain disorders involving the immune mechanism: Secondary | ICD-10-CM

## 2016-11-14 LAB — POCT URINALYSIS DIPSTICK
Bilirubin, UA: NEGATIVE
Blood, UA: NEGATIVE
Glucose, UA: NEGATIVE
Ketones, UA: NEGATIVE
Leukocytes, UA: NEGATIVE
Nitrite, UA: NEGATIVE
Spec Grav, UA: 1.015 (ref 1.010–1.025)
Urobilinogen, UA: 0.2 E.U./dL
pH, UA: 6.5 (ref 5.0–8.0)

## 2016-11-14 NOTE — Patient Instructions (Addendum)
Tdap Vaccine (Tetanus, Diphtheria and Pertussis): What You Need to Know 1. Why get vaccinated? Tetanus, diphtheria and pertussis are very serious diseases. Tdap vaccine can protect [place] from these diseases. And, Tdap vaccine given to pregnant women can protect newborn babies against pertussis. TETANUS (Lockjaw) is rare in the [place] today. It causes painful muscle tightening and stiffness, usually all over the body.  It can lead to tightening of muscles in the head and neck so you can't open your mouth, swallow, or sometimes even breathe. Tetanus kills about 1 out of 10 people who are infected even after receiving the best medical care.  DIPHTHERIA is also rare in the [place] today. It can cause a thick coating to form in the back of the throat.  It can lead to breathing problems, heart failure, paralysis, and death.  PERTUSSIS (Whooping Cough) causes severe coughing spells, which can cause difficulty breathing, vomiting and disturbed sleep.  It can also lead to weight loss, incontinence, and rib fractures. Up to 2 in 100 adolescents and 5 in 100 adults with pertussis are hospitalized or have complications, which could include pneumonia or death.  These diseases are caused by bacteria. Diphtheria and pertussis are spread from person to person through secretions from coughing or sneezing. Tetanus enters the body through cuts, scratches, or wounds. Before vaccines, as many as 200,000 cases of diphtheria, 200,000 cases of pertussis, and hundreds of cases of tetanus, were reported in the [place] each year. Since vaccination began, reports of cases for tetanus and diphtheria have dropped by about 99% and for pertussis by about 80%. 2. Tdap vaccine Tdap vaccine can protect adolescents and adults from tetanus, diphtheria, and pertussis. One dose of Tdap is routinely given at age 22 or 22. People who did not get Tdap at that age should get it as soon as possible. Tdap is especially  important for healthcare professionals and anyone having close contact with a baby younger than 12 months. Pregnant women should get a dose of Tdap during every pregnancy, to protect the newborn from pertussis. Infants are most at risk for severe, life-threatening complications from pertussis. Another vaccine, called Td, protects against tetanus and diphtheria, but not pertussis. A Td booster should be given every 10 years. Tdap may be given as one of these boosters if you have never gotten Tdap before. Tdap may also be given after a severe cut or burn to prevent tetanus infection. Your doctor or the person giving you the vaccine can give you more information. Tdap may safely be given at the same time as other vaccines. 3. Some people should not get this vaccine  A person who has ever had a life-threatening allergic reaction after a previous dose of any diphtheria, tetanus or pertussis containing vaccine, OR has a severe allergy to any part of this vaccine, should not get Tdap vaccine. Tell the person giving the vaccine about any severe allergies.  Anyone who had coma or long repeated seizures within 7 days after a childhood dose of DTP or DTaP, or a previous dose of Tdap, should not get Tdap, unless a cause other than the vaccine was found. They can still get Td.  Talk to your doctor if you: ? have seizures or another nervous system problem, ? had severe pain or swelling after any vaccine containing diphtheria, tetanus or pertussis, ? ever had a condition called Guillain-Barr Syndrome (GBS), ? aren't feeling well on the day the shot is scheduled. 4. Risks With any  medicine, including vaccines, there is a chance of side effects. These are usually mild and go away on their own. Serious reactions are also possible but are rare. Most people who get Tdap vaccine do not have any problems with it. Mild problems following Tdap: (Did not interfere with activities)  Pain where the shot was given (about  3 in 4 adolescents or 2 in 3 adults)  Redness or swelling where the shot was given (about 1 person in 5)  Mild fever of at least 100.25F (up to about 1 in 25 adolescents or 1 in 100 adults)  Headache (about 3 or 4 people in 10)  Tiredness (about 1 person in 3 or 4)  Nausea, vomiting, diarrhea, stomach ache (up to 1 in 4 adolescents or 1 in 10 adults)  Chills, sore joints (about 1 person in 10)  Body aches (about 1 person in 3 or 4)  Rash, swollen glands (uncommon)  Moderate problems following Tdap: (Interfered with activities, but did not require medical attention)  Pain where the shot was given (up to 1 in 5 or 6)  Redness or swelling where the shot was given (up to about 1 in 16 adolescents or 1 in 12 adults)  Fever over 102F (about 1 in 100 adolescents or 1 in 250 adults)  Headache (about 1 in 7 adolescents or 1 in 10 adults)  Nausea, vomiting, diarrhea, stomach ache (up to 1 or 3 people in 100)  Swelling of the entire arm where the shot was given (up to about 1 in 500).  Severe problems following Tdap: (Unable to perform usual activities; required medical attention)  Swelling, severe pain, bleeding and redness in the arm where the shot was given (rare).  Problems that could happen after any vaccine:  People sometimes faint after a medical procedure, including vaccination. Sitting or lying down for about 15 minutes can help prevent fainting, and injuries caused by a fall. Tell your doctor if you feel dizzy, or have vision changes or ringing in the ears.  Some people get severe pain in the shoulder and have difficulty moving the arm where a shot was given. This happens very rarely.  Any medication can cause a severe allergic reaction. Such reactions from a vaccine are very rare, estimated at fewer than 1 in a million doses, and would happen within a few minutes to a few hours after the vaccination. As with any medicine, there is a very remote chance of a vaccine  causing a serious injury or death. The safety of vaccines is always being monitored. For more information, visit: http://floyd.org/ 5. What if there is a serious problem? What should I look for? Look for anything that concerns you, such as signs of a severe allergic reaction, very high fever, or unusual behavior. Signs of a severe allergic reaction can include hives, swelling of the face and throat, difficulty breathing, a fast heartbeat, dizziness, and weakness. These would usually start a few minutes to a few hours after the vaccination. What should I do?  If you think it is a severe allergic reaction or other emergency that can't wait, call 9-1-1 or get the person to the nearest hospital. Otherwise, call your doctor.  Afterward, the reaction should be reported to the Vaccine Adverse Event Reporting System (VAERS). Your doctor might file this report, or you can do it yourself through the VAERS web site at www.vaers.LAgents.no, or by calling 1-816-196-7513. ? VAERS does not give medical advice. 6. The National Vaccine Injury [employer]  The [employer] Injury Compensation Program (VICP) is a federal program that was created to compensate people who may have been injured by certain vaccines. Persons who believe they may have been injured by a vaccine can learn about the program and about filing a claim by calling 1-517 417 0349 or visiting the VICP website at SpiritualWord.at. There is a time limit to file a claim for compensation. 7. How can I learn more?  Ask your doctor. He or she can give you the vaccine package insert or suggest other sources of information.  Call your local or state health department.  Contact the Centers for Disease Control and Prevention (CDC): ? Call 636-834-2359 (1-800-CDC-INFO) or ? Visit CDC's website at PicCapture.uy CDC Tdap Vaccine VIS (05/18/13) This information is not intended to replace advice given to you by your  health care provider. Make sure you discuss any questions you have with your health care provider. Document Released: 09/10/2011 Document Revised: 11/30/2015 Document Reviewed: 11/30/2015 Elsevier Interactive Patient Education  2017 [employer].    Third Trimester of Pregnancy The third trimester is from week 28 through week 40 (months 7 through 9). The third trimester is a time when the unborn baby (fetus) is growing rapidly. At the end of the ninth month, the fetus is about 20 inches in length and weighs 6-10 pounds. Body changes during your third trimester Your body will continue to go through many changes during pregnancy. The changes vary from woman to woman. During the third trimester:  Your weight will continue to increase. You can expect to gain 25-35 pounds (11-16 kg) by the end of the pregnancy.  You may begin to get stretch marks on your hips, abdomen, and breasts.  You may urinate more often because the fetus is moving lower into your pelvis and pressing on your bladder.  You may develop or continue to have heartburn. This is caused by increased hormones that slow down muscles in the digestive tract.  You may develop or continue to have constipation because increased hormones slow digestion and cause the muscles that push waste through your intestines to relax.  You may develop hemorrhoids. These are swollen veins (varicose veins) in the rectum that can itch or be painful.  You may develop swollen, bulging veins (varicose veins) in your legs.  You may have increased body aches in the pelvis, back, or thighs. This is due to weight gain and increased hormones that are relaxing your joints.  You may have changes in your hair. These can include thickening of your hair, rapid growth, and changes in texture. Some women also have hair loss during or after pregnancy, or hair that feels dry or thin. Your hair will most likely return to normal after your baby is born.  Your breasts  will continue to grow and they will continue to become tender. A yellow fluid (colostrum) may leak from your breasts. This is the first milk you are producing for your baby.  Your belly button may stick out.  You may notice more swelling in your hands, face, or ankles.  You may have increased tingling or numbness in your hands, arms, and legs. The skin on your belly may also feel numb.  You may feel short of breath because of your expanding uterus.  You may have more problems sleeping. This can be caused by the size of your belly, increased need to urinate, and an increase in your body's metabolism.  You may notice the fetus "dropping," or moving lower in your  abdomen (lightening).  You may have increased vaginal discharge.  You may notice your joints feel loose and you may have pain around your pelvic bone.  What to expect at prenatal visits You will have prenatal exams every 2 weeks until week 36. Then you will have weekly prenatal exams. During a routine prenatal visit:  You will be weighed to make sure you and the baby are growing normally.  Your blood pressure will be taken.  Your abdomen will be measured to track your baby's growth.  The fetal heartbeat will be listened to.  Any test results from the previous visit will be discussed.  You may have a cervical check near your due date to see if your cervix has softened or thinned (effaced).  You will be tested for Group B streptococcus. This happens between 35 and 37 weeks.  Your health care provider may ask you:  What your birth plan is.  How you are feeling.  If you are feeling the baby move.  If you have had any abnormal symptoms, such as leaking fluid, bleeding, severe headaches, or abdominal cramping.  If you are using any tobacco products, including cigarettes, chewing tobacco, and electronic cigarettes.  If you have any questions.  Other tests or screenings that may be performed during your third trimester  include:  Blood tests that check for low iron levels (anemia).  Fetal testing to check the health, activity level, and growth of the fetus. Testing is done if you have certain medical conditions or if there are problems during the pregnancy.  Nonstress test (NST). This test checks the health of your baby to make sure there are no signs of problems, such as the baby not getting enough oxygen. During this test, a belt is placed around your belly. The baby is made to move, and its heart rate is monitored during movement.  What is false labor? False labor is a condition in which you feel small, irregular tightenings of the muscles in the womb (contractions) that usually go away with rest, changing position, or drinking water. These are called Braxton Hicks contractions. Contractions may last for hours, days, or even weeks before true labor sets in. If contractions come at regular intervals, become more frequent, increase in intensity, or become painful, you should see your health care provider. What are the signs of labor?  Abdominal cramps.  Regular contractions that start at 10 minutes apart and become stronger and more frequent with time.  Contractions that start on the top of the uterus and spread down to the lower abdomen and back.  Increased pelvic pressure and dull back pain.  A watery or bloody mucus discharge that comes from the vagina.  Leaking of amniotic fluid. This is also known as your "water breaking." It could be a slow trickle or a gush. Let your health care provider know if it has a color or strange odor. If you have any of these signs, call your health care provider right away, even if it is before your due date. Follow these instructions at home: Medicines  Follow your health care provider's instructions regarding medicine use. Specific medicines may be either safe or unsafe to take during pregnancy.  Take a prenatal vitamin that contains at least 600 micrograms (mcg) of  folic acid.  If you develop constipation, try taking a stool softener if your health care provider approves. Eating and drinking  Eat a balanced diet that includes fresh fruits and vegetables, whole grains, good sources of protein  such as meat, eggs, or tofu, and low-fat dairy. Your health care provider will help you determine the amount of weight gain that is right for you.  Avoid raw meat and uncooked cheese. These carry germs that can cause birth defects in the baby.  If you have low calcium intake from food, talk to your health care provider about whether you should take a daily calcium supplement.  Eat four or five small meals rather than three large meals a day.  Limit foods that are high in fat and processed sugars, such as fried and sweet foods.  To prevent constipation: ? Drink enough fluid to keep your urine clear or pale yellow. ? Eat foods that are high in fiber, such as fresh fruits and vegetables, whole grains, and beans. Activity  Exercise only as directed by your health care provider. Most women can continue their usual exercise routine during pregnancy. Try to exercise for 30 minutes at least 5 days a week. Stop exercising if you experience uterine contractions.  Avoid heavy lifting.  Do not exercise in extreme heat or humidity, or at high altitudes.  Wear low-heel, comfortable shoes.  Practice good posture.  You may continue to have sex unless your health care provider tells you otherwise. Relieving pain and discomfort  Take frequent breaks and rest with your legs elevated if you have leg cramps or low back pain.  Take warm sitz baths to soothe any pain or discomfort caused by hemorrhoids. Use hemorrhoid cream if your health care provider approves.  Wear a good support bra to prevent discomfort from breast tenderness.  If you develop varicose veins: ? Wear support pantyhose or compression stockings as told by your healthcare provider. ? Elevate your feet for  15 minutes, 3-4 times a day. Prenatal care  Write down your questions. Take them to your prenatal visits.  Keep all your prenatal visits as told by your health care provider. This is important. Safety  Wear your seat belt at all times when driving.  Make a list of emergency phone numbers, including numbers for family, friends, the hospital, and police and fire departments. General instructions  Avoid cat litter boxes and soil used by cats. These carry germs that can cause birth defects in the baby. If you have a cat, ask someone to clean the litter box for you.  Do not travel far distances unless it is absolutely necessary and only with the approval of your health care provider.  Do not use hot tubs, steam rooms, or saunas.  Do not drink alcohol.  Do not use any products that contain nicotine or tobacco, such as cigarettes and e-cigarettes. If you need help quitting, ask your health care provider.  Do not use any medicinal herbs or unprescribed drugs. These chemicals affect the formation and growth of the baby.  Do not douche or use tampons or scented sanitary pads.  Do not cross your legs for long periods of time.  To prepare for the arrival of your baby: ? Take prenatal classes to understand, practice, and ask questions about labor and delivery. ? Make a trial run to the hospital. ? Visit the hospital and tour the maternity area. ? Arrange for maternity or paternity leave through employers. ? Arrange for family and friends to take care of pets while you are in the hospital. ? Purchase a rear-facing car seat and make sure you know how to install it in your car. ? Pack your hospital bag. ? Prepare the baby's nursery. Make  sure to remove all pillows and stuffed animals from the baby's crib to prevent suffocation.  Visit your dentist if you have not gone during your pregnancy. Use a soft toothbrush to brush your teeth and be gentle when you floss. Contact a health care provider  if:  You are unsure if you are in labor or if your water has broken.  You become dizzy.  You have mild pelvic cramps, pelvic pressure, or nagging pain in your abdominal area.  You have lower back pain.  You have persistent nausea, vomiting, or diarrhea.  You have an unusual or bad smelling vaginal discharge.  You have pain when you urinate. Get help right away if:  Your water breaks before 37 weeks.  You have regular contractions less than 5 minutes apart before 37 weeks.  You have a fever.  You are leaking fluid from your vagina.  You have spotting or bleeding from your vagina.  You have severe abdominal pain or cramping.  You have rapid weight loss or weight gain.  You have shortness of breath with chest pain.  You notice sudden or extreme swelling of your face, hands, ankles, feet, or legs.  Your baby makes fewer than 10 movements in 2 hours.  You have severe headaches that do not go away when you take medicine.  You have vision changes. Summary  The third trimester is from week 28 through week 40, months 7 through 9. The third trimester is a time when the unborn baby (fetus) is growing rapidly.  During the third trimester, your discomfort may increase as you and your baby continue to gain weight. You may have abdominal, leg, and back pain, sleeping problems, and an increased need to urinate.  During the third trimester your breasts will keep growing and they will continue to become tender. A yellow fluid (colostrum) may leak from your breasts. This is the first milk you are producing for your baby.  False labor is a condition in which you feel small, irregular tightenings of the muscles in the womb (contractions) that eventually go away. These are called Braxton Hicks contractions. Contractions may last for hours, days, or even weeks before true labor sets in.  Signs of labor can include: abdominal cramps; regular contractions that start at 10 minutes apart and  become stronger and more frequent with time; watery or bloody mucus discharge that comes from the vagina; increased pelvic pressure and dull back pain; and leaking of amniotic fluid. This information is not intended to replace advice given to you by your health care provider. Make sure you discuss any questions you have with your health care provider. Document Released: 03/05/2001 Document Revised: 08/17/2015 Document Reviewed: 05/12/2012 Elsevier Interactive Patient Education  2017 [employer].   Trial of Labor After Cesarean Delivery A trial of labor after cesarean delivery (TOLAC) is when a woman tries to give birth vaginally after a previous cesarean delivery. TOLAC may be a safe and appropriate option for you depending on your medical history and other risk factors. When TOLAC is successful and you are able to have a vaginal delivery, this is called a vaginal birth after cesarean delivery (VBAC). Candidates for TOLAC TOLAC is possible for some women who:  Have undergone one or two prior cesarean deliveries in which the incision of the uterus was horizontal (low transverse).  Are carrying twins and have had one prior low transverse incision during a cesarean delivery.  Do not have a vertical (classical) uterine scar.  Have not had  a tear in the wall of their uterus (uterine rupture).  TOLAC is also supported for women who meet appropriate criteria and:  Are under the age of 40 years.  Are tall and have a body mass index (BMI) of less than 30.  Have an unknown uterine scar.  Give birth in a facility equipped to handle an emergency cesarean delivery. This team should be able to handle possible complications such as a uterine rupture.  Have thorough counseling about the benefits and risks of TOLAC.  Have discussed future pregnancy plans with their health care provider.  Plan to have several more pregnancies.  Most successful candidates for TOLAC:  Have had a successful  vaginal delivery before or after their cesarean delivery.  Experience labor that begins naturally on or before the due date (40 weeks of gestation).  Do not have a very large (macrosomic) baby.  Had a prior cesarean delivery but are not currently experiencing factors that would prompt a cesarean delivery (such as a breech position).  Had only one prior cesarean delivery.  Had a prior cesarean delivery that was performed early in labor and not after full cervical dilation. TOLAC may be most appropriate for women who meet the above guidelines and who plan to have more pregnancies. TOLAC is not recommended for home births. Least successful candidates for TOLAC:  Have an induced labor with an unfavorable cervix. An unfavorable cervix is when the cervix is not dilating enough (among other factors).  Have never had a vaginal delivery.  Have had more than two cesarean deliveries.  Have a pregnancy at more than 40 weeks of gestation.  Are pregnant with a baby with a suspected weight greater than 4,000 grams (8 pounds) and who have no prior history of a vaginal delivery.  Have closely spaced pregnancies. Suggested benefits of TOLAC  You may have a faster recovery time.  You may have a shorter stay in the hospital.  You may have less pain and fewer problems than with a cesarean delivery. Women who have a cesarean delivery have a higher chance of needing blood or getting a fever, an infection, or a blood clot in the legs. Suggested risks of TOLAC The highest risk of complications happens to women who attempt a TOLAC and fail. A failed TOLAC results in an unplanned cesarean delivery. Risks related to [facility] or repeat cesarean deliveries include:  Blood loss.  Infection.  Blood clot.  Injury to surrounding tissues or organs.  Having to remove the uterus (hysterectomy).  Potential problems with the placenta (such as placenta previa or placenta accreta) in future  pregnancies.  Although very rare, the main concerns with TOLAC are:  Rupture of the uterine scar from a past cesarean delivery.  Needing an emergency cesarean delivery.  Having a bad outcome for the baby (perinatal morbidity).  Where to find more information:  American Congress of Obstetricians and Gynecologists: www.acog.org  [employer] Nurse-Mid[employer]: www.midwife.org This information is not intended to replace advice given to you by your health care provider. Make sure you discuss any questions you have with your health care provider. Document Released: 11/27/2010 Document Revised: 02/07/2016 Document Reviewed: 08/31/2012 Elsevier Interactive Patient Education  [employer].

## 2016-11-15 ENCOUNTER — Ambulatory Visit (INDEPENDENT_AMBULATORY_CARE_PROVIDER_SITE_OTHER): Payer: Medicaid Other | Admitting: Certified Nurse Midwife

## 2016-11-15 VITALS — BP 102/62 | HR 97 | Wt 185.0 lb

## 2016-11-15 DIAGNOSIS — Z98891 History of uterine scar from previous surgery: Secondary | ICD-10-CM

## 2016-11-15 DIAGNOSIS — O99013 Anemia complicating pregnancy, third trimester: Secondary | ICD-10-CM

## 2016-11-15 DIAGNOSIS — O0990 Supervision of high risk pregnancy, unspecified, unspecified trimester: Secondary | ICD-10-CM | POA: Insufficient documentation

## 2016-11-15 DIAGNOSIS — O0993 Supervision of high risk pregnancy, unspecified, third trimester: Secondary | ICD-10-CM

## 2016-11-15 DIAGNOSIS — Z3A29 29 weeks gestation of pregnancy: Secondary | ICD-10-CM

## 2016-11-15 DIAGNOSIS — D509 Iron deficiency anemia, unspecified: Secondary | ICD-10-CM

## 2016-11-15 LAB — CBC
Hematocrit: 26.7 % — ABNORMAL LOW (ref 34.0–46.6)
Hemoglobin: 7.8 g/dL — ABNORMAL LOW (ref 11.1–15.9)
MCH: 18.6 pg — ABNORMAL LOW (ref 26.6–33.0)
MCHC: 29.2 g/dL — ABNORMAL LOW (ref 31.5–35.7)
MCV: 64 fL — ABNORMAL LOW (ref 79–97)
Platelets: 314 10*3/uL (ref 150–379)
RBC: 4.19 x10E6/uL (ref 3.77–5.28)
RDW: 20.1 % — ABNORMAL HIGH (ref 12.3–15.4)
WBC: 9.9 10*3/uL (ref 3.4–10.8)

## 2016-11-15 LAB — GLUCOSE TOLERANCE, 1 HOUR: Glucose, 1Hr PP: 76 mg/dL (ref 65–199)

## 2016-11-15 NOTE — Progress Notes (Signed)
Transfer of care from [facility]. Pt reports no problems. Would like to know if she will be having any more ultrasounds.       Obstetrics & Gynecology Office Visit   Chief Complaint:  Chief Complaint  Patient presents with  . Routine Prenatal Visit    History of Present Illness: 21 year old G2 P1001 with EDC=01/25/2017 by her LMP and c/w a 17 week ultrasound (EDC=01/24/2017) presents for a transfer in appointment from [facility] Pregnancy is remarkable for having a low transverse Cesarean section last year for an abruption at [facility] delivering a 6#5.9oz female on 08/29/2015. She presented in early labor and was being augmented when she developed heavy vaginal bleeding.   Other risk factors include late entry to care, severe anemia with H&H at time of NOB 7.8gm/dl and 46.5%. She is taking iron most days and her vitamin. Has a history of anemia and has gotten IV iron in the past. No history of blood transfusion. She had a normal anatomy scan. Received her TDAP yesterday. Wants to bottle feed. Possibly interested in an IUD for contraception. FOB is involved with her care.  She reports good FM. She has concerns regarding a an upper abdominal pain she has had since the beginning of her pregnancy. The pain comes and goes, but when it comes it lasts for 1-2 hours. She denies nausea and vomiting accompanying the pain. Has not related the pain to eating  or eating fried foods. No diarrhea. "passes a lot of gas". No heartburn or reflux. No history of peptic ulcers or GB disease. Has not taken any medication for the pain.      Review of Systems:  ROS   Past Medical History:  Past Medical History:  Diagnosis Date  . Allergy   . Anemia   . Dysmenorrhea   . Overweight   . Seborrhea     Past Surgical History:  Past Surgical History:  Procedure Laterality Date  . CESAREAN SECTION N/A 08/29/2015   Procedure: CESAREAN SECTION;  Surgeon: Vena Austria, MD;  Location: [facility] ORS;  Service:  Obstetrics;  Laterality: N/A;  . WISDOM TOOTH EXTRACTION      Gynecologic History: Patient's last menstrual period was 04/20/2016.  Obstetric History:  OB History  Gravida Para Term Preterm AB Living  3 1 1  0 1 1  SAB TAB Ectopic Multiple Live Births  1 0 0 0 1    # Outcome Date GA Lbr Len/2nd Weight Sex Delivery Anes PTL Lv  3 Current           2 Term 08/29/15 [redacted]w[redacted]d  6 lb 5.9 oz (2.89 kg) F  Gen  LIV     Complications: Other Excessive Bleeding,Abruptio Placenta  1 SAB             Obstetric Comments  LTCS for placental abruption    Family History:  Family History  Problem Relation Age of Onset  . Heart failure Father 85  . Breast cancer Paternal Grandmother   . Diabetes Paternal Grandmother   . Hypertension Paternal Grandmother   . Colon cancer Paternal Grandfather   . Hypertension Paternal Grandfather     Social History:  Social History   Social History  . Marital status: Single    Spouse name: N/A  . Number of children: 0  . Years of education: 12   Occupational History  . Deli  [employer]   Social History Main Topics  . Smoking status: Never Smoker  . Smokeless tobacco: Never  Used  . Alcohol use No  . Drug use: No  . Sexual activity: Not Currently    Partners: Male    Birth control/ protection: IUD   Other Topics Concern  . Not on file   Social History Narrative  . No narrative on file    Allergies:  No Known Allergies  Medications: Prior to Admission medications   Medication Sig Start Date End Date Taking? Authorizing Provider  ferrous sulfate 325 (65 FE) MG tablet Take 1 tablet (325 mg total) by mouth 2 (two) times daily with a meal. 09/01/15  Yes Brothers, Tamara, CNM  Prenatal Multivit-Min-Fe-FA (PRENATAL VITAMINS PO) Take 1 tablet by mouth every morning.    Yes [provider]    Physical Exam Vitals: BP 102/62   Pulse 97   Wt 185 lb (83.9 kg)   LMP 04/20/2016   BMI 29.86 kg/m  Physical Exam  Constitutional: She is  oriented to person, place, and time. She appears well-developed and well-nourished. No distress.  GI:  Abdomen: soft, FH at 29 cm, FHTs WNL. Uterus NT, upper abdomen NT, no diastasis noted.  Neurological: She is alert and oriented to person, place, and time.  Skin: Skin is warm and dry.  Psychiatric: Her behavior is normal.     Assessment: 21 y.o. G3P1011 IUP at 29.6 weeks S=D Previous Cesarean section for abruption at term Severe anemia Upper abdominal pain of unknown etiology?    Plan: Discussed/ reviewed prenatal care at [facility], current providers. Is interested in [facility] and advised of risks and benefits of TOLAC vs repeat Cesarean section. Explained of increased risk of uterine rupture when trying to induce labor with unripe cervix. Explained that the best chance of delivering vaginally is if she goes into labor on her own. Recommend scheduling repeat Cesarean section for around her due date in case she does not go into labor. Will do at next visit. Recommend trying Gas X or Mylicon after meals to see if that helps her upper abdominal discomfort. Her 28 week lab results that she had done at [facility] yesterday are pending, but if she continues to be severely anemic, will refer to hematology for evaluation and treatment ROB in 2 weeks to discuss mode of delivery with MD.  Farrel Conners, CNM

## 2016-11-17 ENCOUNTER — Encounter: Payer: Self-pay | Admitting: Certified Nurse Midwife

## 2016-11-17 DIAGNOSIS — O99013 Anemia complicating pregnancy, third trimester: Secondary | ICD-10-CM | POA: Insufficient documentation

## 2016-11-17 NOTE — Progress Notes (Signed)
ROB-Pt doing well. Education regarding treatment for round ligament pain in pregnancy including use of abdominal support, tylenol, and icy/hot. Blood transfusion consent reviewed and signed. TDaP given. Brochure given for [facility] doula program. Gluola and repeat CBC today. Reviewed red flag symptoms and when to call. RTC x 2 weeks for ROB or sooner if needed.

## 2016-11-17 NOTE — Progress Notes (Signed)
Please contact patient with lab results-sever anemia-and have her follow up with new providers as I see that she has transferred to [facility]. Thanks, JML

## 2016-11-19 ENCOUNTER — Observation Stay
Admission: EM | Admit: 2016-11-19 | Discharge: 2016-11-19 | Disposition: A | Discharge status: Home / Self Care | Payer: Medicaid Other | Attending: Advanced Practice Midwife | Admitting: Advanced Practice Midwife

## 2016-11-19 ENCOUNTER — Observation Stay: Payer: Medicaid Other

## 2016-11-19 DIAGNOSIS — O26893 Other specified pregnancy related conditions, third trimester: Secondary | ICD-10-CM | POA: Diagnosis not present

## 2016-11-19 DIAGNOSIS — O99013 Anemia complicating pregnancy, third trimester: Secondary | ICD-10-CM | POA: Diagnosis not present

## 2016-11-19 DIAGNOSIS — Z3A30 30 weeks gestation of pregnancy: Secondary | ICD-10-CM | POA: Insufficient documentation

## 2016-11-19 DIAGNOSIS — D649 Anemia, unspecified: Secondary | ICD-10-CM | POA: Insufficient documentation

## 2016-11-19 DIAGNOSIS — R1010 Upper abdominal pain, unspecified: Secondary | ICD-10-CM | POA: Insufficient documentation

## 2016-11-19 DIAGNOSIS — R109 Unspecified abdominal pain: Secondary | ICD-10-CM | POA: Diagnosis present

## 2016-11-19 DIAGNOSIS — O212 Late vomiting of pregnancy: Secondary | ICD-10-CM | POA: Insufficient documentation

## 2016-11-19 LAB — CBC WITH DIFFERENTIAL/PLATELET
Basophils Absolute: 0.1 10*3/uL (ref 0–0.1)
Basophils Relative: 1 %
Eosinophils Absolute: 0 10*3/uL (ref 0–0.7)
Eosinophils Relative: 1 %
HCT: 27.1 % — ABNORMAL LOW (ref 35.0–47.0)
Hemoglobin: 8.3 g/dL — ABNORMAL LOW (ref 12.0–16.0)
Lymphocytes Relative: 10 %
Lymphs Abs: 0.8 10*3/uL — ABNORMAL LOW (ref 1.0–3.6)
MCH: 19.2 pg — ABNORMAL LOW (ref 26.0–34.0)
MCHC: 30.6 g/dL — ABNORMAL LOW (ref 32.0–36.0)
MCV: 62.7 fL — ABNORMAL LOW (ref 80.0–100.0)
Monocytes Absolute: 0.5 10*3/uL (ref 0.2–0.9)
Monocytes Relative: 6 %
Neutro Abs: 7.2 10*3/uL — ABNORMAL HIGH (ref 1.4–6.5)
Neutrophils Relative %: 84 %
Platelets: 270 10*3/uL (ref 150–440)
RBC: 4.33 MIL/uL (ref 3.80–5.20)
RDW: 21 % — ABNORMAL HIGH (ref 11.5–14.5)
WBC: 8.6 10*3/uL (ref 3.6–11.0)

## 2016-11-19 LAB — URINE DRUG SCREEN, QUALITATIVE (ARMC ONLY)
Amphetamines, Ur Screen: NOT DETECTED
Barbiturates, Ur Screen: NOT DETECTED
Benzodiazepine, Ur Scrn: NOT DETECTED
Cannabinoid 50 Ng, Ur ~~LOC~~: NOT DETECTED
Cocaine Metabolite,Ur ~~LOC~~: NOT DETECTED
MDMA (Ecstasy)Ur Screen: NOT DETECTED
Methadone Scn, Ur: NOT DETECTED
Opiate, Ur Screen: NOT DETECTED
Phencyclidine (PCP) Ur S: NOT DETECTED
Tricyclic, Ur Screen: NOT DETECTED

## 2016-11-19 LAB — COMPREHENSIVE METABOLIC PANEL
ALT: 13 U/L — ABNORMAL LOW (ref 14–54)
AST: 21 U/L (ref 15–41)
Albumin: 3.2 g/dL — ABNORMAL LOW (ref 3.5–5.0)
Alkaline Phosphatase: 100 U/L (ref 38–126)
Anion gap: 8 (ref 5–15)
BUN: 9 mg/dL (ref 6–20)
CO2: 23 mmol/L (ref 22–32)
Calcium: 8.7 mg/dL — ABNORMAL LOW (ref 8.9–10.3)
Chloride: 105 mmol/L (ref 101–111)
Creatinine, Ser: <0.3 mg/dL — ABNORMAL LOW (ref 0.44–1.00)
Glucose, Bld: 81 mg/dL (ref 65–99)
Potassium: 3.4 mmol/L — ABNORMAL LOW (ref 3.5–5.1)
Sodium: 136 mmol/L (ref 135–145)
Total Bilirubin: 0.7 mg/dL (ref 0.3–1.2)
Total Protein: 7.2 g/dL (ref 6.5–8.1)

## 2016-11-19 LAB — URINALYSIS, ROUTINE W REFLEX MICROSCOPIC
Bilirubin Urine: NEGATIVE
Glucose, UA: NEGATIVE mg/dL
Hgb urine dipstick: NEGATIVE
Ketones, ur: 20 mg/dL — AB
Nitrite: NEGATIVE
Protein, ur: NEGATIVE mg/dL
Specific Gravity, Urine: 1.013 (ref 1.005–1.030)
pH: 6 (ref 5.0–8.0)

## 2016-11-19 LAB — LIPASE, BLOOD: Lipase: 23 U/L (ref 11–51)

## 2016-11-19 MED ORDER — FAMOTIDINE IN NACL 20-0.9 MG/50ML-% IV SOLN: 20.0000 mg | Freq: Two times a day (BID) | INTRAVENOUS | Status: DC

## 2016-11-19 MED ORDER — LIDOCAINE VISCOUS 2 % MT SOLN
15.0000 mL | Freq: Once | OROMUCOSAL | Status: AC
  Administered 2016-11-19: 15 mL via OROMUCOSAL
  Filled 2016-11-19 (×2): qty 15

## 2016-11-19 MED ORDER — LACTATED RINGERS IV BOLUS (SEPSIS)
500.0000 mL | Freq: Once | INTRAVENOUS | Status: AC
  Administered 2016-11-19: 500 mL via INTRAVENOUS

## 2016-11-19 MED ORDER — GI COCKTAIL ~~LOC~~: 30.0000 mL | Freq: Once | ORAL | Status: DC

## 2016-11-19 MED ORDER — ALUM & MAG HYDROXIDE-SIMETH 200-200-20 MG/5ML PO SUSP
30.0000 mL | Freq: Once | ORAL | Status: AC
  Administered 2016-11-19: 30 mL via ORAL
  Filled 2016-11-19: qty 30

## 2016-11-19 MED ORDER — HYDROCODONE-ACETAMINOPHEN 5-325 MG PO TABS: 1.0000 | ORAL_TABLET | Freq: Four times a day (QID) | ORAL | 0 refills | Status: DC | PRN

## 2016-11-19 MED ORDER — LACTATED RINGERS IV SOLN
INTRAVENOUS | Status: DC
  Administered 2016-11-19 (×2): via INTRAVENOUS

## 2016-11-19 MED ORDER — ONDANSETRON HCL 4 MG/2ML IJ SOLN: 4.0000 mg | INTRAMUSCULAR | Status: DC | PRN

## 2016-11-19 MED ORDER — FAMOTIDINE IN NACL 20-0.9 MG/50ML-% IV SOLN
INTRAVENOUS | Status: AC
  Administered 2016-11-19: 20 mg
  Filled 2016-11-19: qty 50

## 2016-11-19 NOTE — Discharge Summary (Signed)
Physician Final Progress Note  Patient ID: Hailey Martin MRN: [id number] DOB/AGE: [birth year] 22 y.o.  Admit date: 11/19/2016 Admitting provider: Conard Novak, MD Discharge date: 11/19/2016   Admission Diagnoses: upper abdominal pain  Discharge Diagnoses:  Active Problems:   Abdominal pain during pregnancy, third trimester IUP at 30 weeks 3 days, with reactive NST, not in labor, normal labs and imaging, some improvement of pain  History of Present Illness: The patient is a 21 y.o. female G3P1011 at 106w3d who presents for pain across her upper abdomen since yesterday. She has had increased pain in her upper abdomen since the beginning of 3rd trimester but yesterday was the worst it had been. She admits positive fetal movement. She felt nauseated and she vomited one time yesterday. She denies diarrhea, constipation, fever, chills, shortness of breath.    Past Medical History:  Diagnosis Date  . Allergy   . Anemia   . Dysmenorrhea   . Overweight   . Seborrhea     Past Surgical History:  Procedure Laterality Date  . CESAREAN SECTION N/A 08/29/2015   Procedure: CESAREAN SECTION;  Surgeon: Vena Austria, MD;  Location: [facility] ORS;  Service: Obstetrics;  Laterality: N/A;  . WISDOM TOOTH EXTRACTION      No current facility-administered medications on file prior to encounter.    Current Outpatient Prescriptions on File Prior to Encounter  Medication Sig Dispense Refill  . ferrous sulfate 325 (65 FE) MG tablet Take 1 tablet (325 mg total) by mouth 2 (two) times daily with a meal. 60 tablet 3  . Prenatal Multivit-Min-Fe-FA (PRENATAL VITAMINS PO) Take 1 tablet by mouth every morning.       No Known Allergies  Social History   Social History  . Marital status: Single    Spouse name: N/A  . Number of children: 1  . Years of education: 21   Occupational History  . Deli  [employer]   Social History Main Topics  . Smoking status: Never Smoker  . Smokeless tobacco: Never  Used  . Alcohol use No  . Drug use: No  . Sexual activity: Not Currently    Partners: Male    Birth control/ protection: IUD   Other Topics Concern  . Not on file   Social History Narrative  . No narrative on file    Physical Exam: BP (!) 115/59   Pulse 82   Temp 98.2 F (36.8 C) (Oral)   Resp 20   Ht 5\' 6"  (1.676 m)   Wt 185 lb (83.9 kg)   LMP 04/20/2016   BMI 29.86 kg/m   Gen: NAD CV: RRR Pulm: CTAB Pelvic: deferred Toco: negative Fetal well being: 130 bpm, moderate variability, +accelerations, -decelerations Ext: no evidence of DVT  Consults: None   Significant Findings/ Diagnostic Studies: labs and ultrasound  Results for Hailey Martin (MRN [id number]) as of 11/19/2016 11:57  Ref. Range 11/19/2016 03:06 11/19/2016 04:37  COMPREHENSIVE METABOLIC PANEL Unknown Rpt (A)   Sodium Latest Ref Range: 135 - 145 mmol/L 136   Potassium Latest Ref Range: 3.5 - 5.1 mmol/L 3.4 (L)   Chloride Latest Ref Range: 101 - 111 mmol/L 105   CO2 Latest Ref Range: 22 - 32 mmol/L 23   Glucose Latest Ref Range: 65 - 99 mg/dL 81   BUN Latest Ref Range: 6 - 20 mg/dL 9   Creatinine Latest Ref Range: 0.44 - 1.00 mg/dL <8.46 (L)   Calcium Latest Ref Range: 8.9 - 10.3 mg/dL 8.7 (  L)   Anion gap Latest Ref Range: 5 - 15  8   Alkaline Phosphatase Latest Ref Range: 38 - 126 U/L 100   Albumin Latest Ref Range: 3.5 - 5.0 g/dL 3.2 (L)   Lipase Latest Ref Range: 11 - 51 U/L 23   AST Latest Ref Range: 15 - 41 U/L 21   ALT Latest Ref Range: 14 - 54 U/L 13 (L)   Total Protein Latest Ref Range: 6.5 - 8.1 g/dL 7.2   Total Bilirubin Latest Ref Range: 0.3 - 1.2 mg/dL 0.7   GFR, Est African American Latest Ref Range: >60 mL/min NOT CALCULATED   GFR, Est Non African American Latest Ref Range: >60 mL/min NOT CALCULATED   WBC Latest Ref Range: 3.6 - 11.0 K/uL 8.6   RBC Latest Ref Range: 3.80 - 5.20 MIL/uL 4.33   Hemoglobin Latest Ref Range: 12.0 - 16.0 g/dL 8.3 (L)   HCT Latest Ref Range: 35.0 - 47.0 %  27.1 (L)   MCV Latest Ref Range: 80.0 - 100.0 fL 62.7 (L)   MCH Latest Ref Range: 26.0 - 34.0 pg 19.2 (L)   MCHC Latest Ref Range: 32.0 - 36.0 g/dL 61.4 (L)   RDW Latest Ref Range: 11.5 - 14.5 % 21.0 (H)   Platelets Latest Ref Range: 150 - 440 K/uL 270   Neutrophils Latest Units: % 84   Lymphocytes Latest Units: % 10   Monocytes Relative Latest Units: % 6   Eosinophil Latest Units: % 1   Basophil Latest Units: % 1   NEUT# Latest Ref Range: 1.4 - 6.5 K/uL 7.2 (H)   Lymphocyte # Latest Ref Range: 1.0 - 3.6 K/uL 0.8 (L)   Monocyte # Latest Ref Range: 0.2 - 0.9 K/uL 0.5   Eosinophils Absolute Latest Ref Range: 0 - 0.7 K/uL 0.0   Basophils Absolute Latest Ref Range: 0 - 0.1 K/uL 0.1   URINALYSIS, ROUTINE W REFLEX MICROSCOPIC Unknown  Rpt (A)  Appearance Latest Ref Range: CLEAR   CLOUDY (A)  Bilirubin Urine Latest Ref Range: NEGATIVE   NEGATIVE  Color, Urine Latest Ref Range: YELLOW   YELLOW (A)  Glucose Latest Ref Range: NEGATIVE mg/dL  NEGATIVE  Hgb urine dipstick Latest Ref Range: NEGATIVE   NEGATIVE  Ketones, ur Latest Ref Range: NEGATIVE mg/dL  20 (A)  Leukocytes, UA Latest Ref Range: NEGATIVE   SMALL (A)  Nitrite Latest Ref Range: NEGATIVE   NEGATIVE  pH Latest Ref Range: 5.0 - 8.0   6.0  Protein Latest Ref Range: NEGATIVE mg/dL  NEGATIVE  Specific Gravity, Urine Latest Ref Range: 1.005 - 1.030   1.013  Bacteria, UA Latest Ref Range: NONE SEEN   RARE (A)  Mucus Unknown  PRESENT  RBC / HPF Latest Ref Range: 0 - 5 RBC/hpf  0-5  Squamous Epithelial / LPF Latest Ref Range: NONE SEEN   6-30 (A)  WBC, UA Latest Ref Range: 0 - 5 WBC/hpf  0-5  Amphetamines, Ur Screen Latest Ref Range: NONE DETECTED   NONE DETECTED  Barbiturates, Ur Screen Latest Ref Range: NONE DETECTED   NONE DETECTED  Benzodiazepine, Ur Scrn Latest Ref Range: NONE DETECTED   NONE DETECTED  Cocaine Metabolite,Ur [place] Latest Ref Range: NONE DETECTED   NONE DETECTED  Methadone Scn, Ur Latest Ref Range: NONE DETECTED   NONE  DETECTED  MDMA (Ecstasy)Ur Screen Latest Ref Range: NONE DETECTED   NONE DETECTED  Cannabinoid 50 Ng, Ur [place] Latest Ref Range: NONE DETECTED   NONE  DETECTED  Opiate, Ur Screen Latest Ref Range: NONE DETECTED   NONE DETECTED  Phencyclidine (PCP) Ur S Latest Ref Range: NONE DETECTED   NONE DETECTED  Tricyclic, Ur Screen Latest Ref Range: NONE DETECTED   NONE DETECTED   Results for SAMA, ARAUZ (MRN [id number]) as of 11/19/2016 11:57  Ref. Range 11/19/2016 05:26  US ABDOMEN LIMITED Unknown Rpt   Procedures: NST  Discharge Condition: good  Disposition: 01-Home or Self Care  Diet: bland diet, advance as tolerated  Discharge Activity: rest today and advance to activity as tolerated  Discharge Instructions    Discharge activity:    Complete by:  As directed    Rest today and activity as tolerated   Discharge diet:    Complete by:  As directed    Bland diet, advance as tolerated   No sexual activity restrictions    Complete by:  As directed    Notify physician for a general feeling that "something is not right"    Complete by:  As directed    Notify physician for increase or change in vaginal discharge    Complete by:  As directed    Notify physician for intestinal cramps, with or without diarrhea, sometimes described as "gas pain"    Complete by:  As directed    Notify physician for leaking of fluid    Complete by:  As directed    Notify physician for low, dull backache, unrelieved by heat or Tylenol    Complete by:  As directed    Notify physician for menstrual like cramps    Complete by:  As directed    Notify physician for pelvic pressure    Complete by:  As directed    Notify physician for uterine contractions.  These may be painless and feel like the uterus is tightening or the baby is  "balling up"    Complete by:  As directed    Notify physician for vaginal bleeding    Complete by:  As directed    PRETERM LABOR:  Includes any of the follwing symptoms that occur between  20 - [redacted] weeks gestation.  If these symptoms are not stopped, preterm labor can result in preterm delivery, placing your baby at risk    Complete by:  As directed      Allergies as of 11/19/2016   No Known Allergies     Medication List    STOP taking these medications   acetaminophen 500 MG tablet Commonly known as:  TYLENOL     TAKE these medications   ferrous sulfate 325 (65 FE) MG tablet Take 1 tablet (325 mg total) by mouth 2 (two) times daily with a meal.   HYDROcodone-acetaminophen 5-325 MG tablet Commonly known as:  NORCO Take 1 tablet by mouth every 6 (six) hours as needed for severe pain.   PRENATAL VITAMINS PO Take 1 tablet by mouth every morning.             Follow-up Information    [facility] Go to.   Why:  regular scheduled prenatal appointment Contact information: [address] [place] [address] [phone]          Total time spent taking care of this patient: 30 minutes  Signed: Tresea Mall, CNM  11/19/2016, 10:49 AM

## 2016-11-19 NOTE — Discharge Instructions (Signed)

## 2016-11-19 NOTE — H&P (Signed)
OB History & Physical   History of Present Illness:  Chief Complaint:  Complains of abdominal pain all day yesterday. HPI:  Hailey Martin is a 21 y.o. G11P1011 female with EDC=01/25/2017 at [redacted]w[redacted]d dated by LMP=17 week ultrasound.  Her pregnancy has been complicated by late onset prenatal care, history of previous Cesarean section for abruption last year, close interconceptual spacing, and anemia with hemoglobin 7.8 gm/dl.. She also had a positive UDS for MJ at the start of her pregnancy.  She presents to L&D for evaluation of abdominal pain. The abdominal pain started with her pregnancy and has been intermittent until yesterday when it became more intense and constant and mostly in her upper abdomen. She complains of cramping in her lower abdomen also. She currently rates the pain 10/10. Tylenol did not relieve pain. Hot water bottle provided some relief. Had an episode of nausea and vomiting x1 yesterday. Has been anorexic. Last ate at 6-7 PM last night and had a few wings. Denies diarrhea, fever, chills, vaginal bleeding, dysuria, back pain or hematuria. Baby active   Prenatal care site: Prenatal care begun at [facility] and transferred to [facility] last week.   Clinic [facility] then [facility] Prenatal Labs  Dating LMP=17 week ultrasound Blood type: A/Positive/-- (05/25 1453)   Genetic Screen 1 Screen:    AFP:     Quad:     NIPS: Antibody:Negative (05/25 1453)  Anatomic [place]  Rubella: 6.39 (05/25 1453) VI  GTT Early:               Third trimester: 75 RPR: Non Reactive (05/25 1453)   Flu vaccine  HBsAg: Negative (05/25 1453)   TDaP vaccine          11/14/2016                                     Rhogam: HIV:   negative  Baby Food       Bottle                                        GBS: (For PCN allergy, check sensitivities)  Contraception ?IUD Pap:  Circumcision  Anemia with H&H 7.8/26.7% at 58 weeks-hematology referral  Pediatrician    Support Person        Maternal Medical History:   Past  Medical History:  Diagnosis Date  . Allergy   . Anemia   . Dysmenorrhea   . Overweight   . Seborrhea     Past Surgical History:  Procedure Laterality Date  . CESAREAN SECTION N/A 08/29/2015   Procedure: CESAREAN SECTION;  Surgeon: Vena Austria, MD;  Location: [facility] ORS;  Service: Obstetrics;  Laterality: N/A;  . WISDOM TOOTH EXTRACTION      No Known Allergies  Prior to Admission medications   Medication Sig Start Date End Date Taking? Authorizing Provider  acetaminophen (TYLENOL) 500 MG tablet Take 500 mg by mouth every 6 (six) hours as needed.   Yes [provider]  ferrous sulfate 325 (65 FE) MG tablet Take 1 tablet (325 mg total) by mouth 2 (two) times daily with a meal. 09/01/15  Yes Brothers, Tamara, CNM  Prenatal Multivit-Min-Fe-FA (PRENATAL VITAMINS PO) Take 1 tablet by mouth every morning.    Yes [provider]      Social History: She  reports that she has never smoked. She has never used smokeless tobacco. She reports that she does not drink alcohol or use drugs. Has used MJ in the past.  Family History: family history includes Breast cancer in her paternal grandmother; Colon cancer in her paternal grandfather; Diabetes in her paternal grandmother; Heart failure (age of onset: 72) in her father; Hypertension in her paternal grandfather and paternal grandmother.   Review of Systems: Negative x 10 systems reviewed except as noted in the HPI.      Physical Exam:  Vital Signs: Temp 98.4 F (36.9 C) (Oral)   Resp 20   Ht 5\' 6"  (1.676 m)   Wt 83.9 kg (185 lb)   LMP 04/20/2016   BMI 29.86 kg/m  General: gravid BF in no acute distress, but appears not to feel well.  HEENT: normocephalic, atraumatic Heart: regular rate & rhythm.  No murmurs Lungs: clear to auscultation bilaterally Back: negative CVATB Abdomen: soft, gravid, tender upper abdomen, especially left of midline; Not tympanic. No guarding Bowel sounds hypoactive. Uterus  NT Pelvic:   Extremities: non-tender, symmetric, no edema bilaterally.  DTRs: +1 Neurologic: Alert & oriented x 3.   Psyche: flat affect Baseline FHR: 160 baseline with accelerations to 170s with moderate variability Bedside Ultrasound: cephalic presentation/ anterior placenta    Assessment:  Hailey Martin is a 21 y.o. G95P1011 female at [redacted]w[redacted]d with upper abdominal pain.  FWB-Cat 1 tracing Plan:  1. Admited to Labor & Delivery for observation 2. CBC, CMP, UA, lipase 3. IV bolus LR then 178ml/hr  4. NPO except ice chips 5. Fetal monitoring  Farrel Conners, [place] 11/19/2016 [phone]

## 2016-11-19 NOTE — OB Triage Note (Signed)
Pt arrived to unit with c/o abdominal pain persistant over the course of the pregnancy that has increased over the last day. Emesis x1, and pain unrelieved by tylenol. +FM. Denies bleeding or leaking of fluid. CNM at bedside to assess

## 2016-11-19 NOTE — Progress Notes (Signed)
L&D Progress Note   S: Sleeping after her GI cocktail  O: Temp 98.4 F (36.9 C) (Oral)   Resp 20   Ht 5\' 6"  (1.676 m)   Wt 83.9 kg (185 lb)   LMP 04/20/2016   BMI 29.86 kg/m    General: sleeping Results for orders placed or performed during the hospital encounter of 11/19/16 (from the past 24 hour(s))  CBC with Differential/Platelet     Status: Abnormal   Collection Time: 11/19/16  3:06 AM  Result Value Ref Range   WBC 8.6 3.6 - 11.0 K/uL   RBC 4.33 3.80 - 5.20 MIL/uL   Hemoglobin 8.3 (L) 12.0 - 16.0 g/dL   HCT 03.2 (L) 12.2 - 48.2 %   MCV 62.7 (L) 80.0 - 100.0 fL   MCH 19.2 (L) 26.0 - 34.0 pg   MCHC 30.6 (L) 32.0 - 36.0 g/dL   RDW 50.0 (H) 37.0 - 48.8 %   Platelets 270 150 - 440 K/uL   Neutrophils Relative % 84 %   Neutro Abs 7.2 (H) 1.4 - 6.5 K/uL   Lymphocytes Relative 10 %   Lymphs Abs 0.8 (L) 1.0 - 3.6 K/uL   Monocytes Relative 6 %   Monocytes Absolute 0.5 0.2 - 0.9 K/uL   Eosinophils Relative 1 %   Eosinophils Absolute 0.0 0 - 0.7 K/uL   Basophils Relative 1 %   Basophils Absolute 0.1 0 - 0.1 K/uL  Comprehensive metabolic panel     Status: Abnormal   Collection Time: 11/19/16  3:06 AM  Result Value Ref Range   Sodium 136 135 - 145 mmol/L   Potassium 3.4 (L) 3.5 - 5.1 mmol/L   Chloride 105 101 - 111 mmol/L   CO2 23 22 - 32 mmol/L   Glucose, Bld 81 65 - 99 mg/dL   BUN 9 6 - 20 mg/dL   Creatinine, Ser <8.91 (L) 0.44 - 1.00 mg/dL   Calcium 8.7 (L) 8.9 - 10.3 mg/dL   Total Protein 7.2 6.5 - 8.1 g/dL   Albumin 3.2 (L) 3.5 - 5.0 g/dL   AST 21 15 - 41 U/L   ALT 13 (L) 14 - 54 U/L   Alkaline Phosphatase 100 38 - 126 U/L   Total Bilirubin 0.7 0.3 - 1.2 mg/dL   GFR calc non Af Amer NOT CALCULATED >60 mL/min   GFR calc Af Amer NOT CALCULATED >60 mL/min   Anion gap 8 5 - 15  Lipase, blood     Status: None   Collection Time: 11/19/16  3:06 AM  Result Value Ref Range   Lipase 23 11 - 51 U/L  Urinalysis, Routine w reflex microscopic     Status: Abnormal   Collection Time: 11/19/16  4:37 AM  Result Value Ref Range   Color, Urine YELLOW (A) YELLOW   APPearance CLOUDY (A) CLEAR   Specific Gravity, Urine 1.013 1.005 - 1.030   pH 6.0 5.0 - 8.0   Glucose, UA NEGATIVE NEGATIVE mg/dL   Hgb urine dipstick NEGATIVE NEGATIVE   Bilirubin Urine NEGATIVE NEGATIVE   Ketones, ur 20 (A) NEGATIVE mg/dL   Protein, ur NEGATIVE NEGATIVE mg/dL   Nitrite NEGATIVE NEGATIVE   Leukocytes, UA SMALL (A) NEGATIVE   RBC / HPF 0-5 0 - 5 RBC/hpf   WBC, UA 0-5 0 - 5 WBC/hpf   Bacteria, UA RARE (A) NONE SEEN   Squamous Epithelial / LPF 6-30 (A) NONE SEEN   Mucus PRESENT   Urine Drug Screen, Qualitative [facility]  only)     Status: None   Collection Time: 11/19/16  4:37 AM  Result Value Ref Range   Tricyclic, Ur Screen NONE DETECTED NONE DETECTED   Amphetamines, Ur Screen NONE DETECTED NONE DETECTED   MDMA (Ecstasy)Ur Screen NONE DETECTED NONE DETECTED   Cocaine Metabolite,Ur [place] NONE DETECTED NONE DETECTED   Opiate, Ur Screen NONE DETECTED NONE DETECTED   Phencyclidine (PCP) Ur S NONE DETECTED NONE DETECTED   Cannabinoid 50 Ng, Ur [place] NONE DETECTED NONE DETECTED   Barbiturates, Ur Screen NONE DETECTED NONE DETECTED   Benzodiazepine, Ur Scrn NONE DETECTED NONE DETECTED   Methadone Scn, Ur NONE DETECTED NONE DETECTED   EXAM: ULTRASOUND ABDOMEN LIMITED RIGHT UPPER QUADRANT  COMPARISON:  None.  FINDINGS: Gallbladder:  No gallstones or wall thickening visualized. No sonographic Murphy sign noted by sonographer.  Common bile duct:  Diameter: 3.1 mm  Liver:  No focal lesion identified. Within normal limits in parenchymal echogenicity. Portal vein is patent on color Doppler imaging with normal direction of blood flow towards the liver.  IMPRESSION: Normal right upper quadrant ultrasound.   Electronically Signed   By: Deatra Robinson M.D.   On: 11/19/2016 05:56  A: Upper abdominal pain, possibly due to gastritis Anemia  P: clear liquids Start  pepcid  Farrel Conners, CNM

## 2016-11-21 ENCOUNTER — Telehealth: Payer: Self-pay | Admitting: Certified Nurse Midwife

## 2016-11-21 NOTE — Telephone Encounter (Signed)
Marylene Landngela [facility] stating she was unable to reach patient due to the contact numbers not in service unable to schedule referral.

## 2016-11-22 NOTE — Progress Notes (Deleted)
[facility]  [phone] [phone]  ID: Hailey Martin OB: 03/11/1996  MR#: [id number]  AOZ#:[id number]#:[id number]  Patient Care Team: Patient, No Pcp Per as PCP - General (General Practice)  CHIEF COMPLAINT: Anemia affecting pregnancy in the third trimester.  INTERVAL HISTORY: ***  REVIEW OF SYSTEMS:   ROS  As per HPI. Otherwise, a complete review of systems is negative.  PAST MEDICAL HISTORY: Past Medical History:  Diagnosis Date  . Allergy   . Anemia   . Dysmenorrhea   . Overweight   . Seborrhea     PAST SURGICAL HISTORY: Past Surgical History:  Procedure Laterality Date  . CESAREAN SECTION N/A 08/29/2015   Procedure: CESAREAN SECTION;  Surgeon: Vena AustriaAndreas Staebler, MD;  Location: [facility] ORS;  Service: Obstetrics;  Laterality: N/A;  . WISDOM TOOTH EXTRACTION      FAMILY HISTORY: Family History  Problem Relation Age of Onset  . Heart failure Father 1635  . Breast cancer Paternal Grandmother   . Diabetes Paternal Grandmother   . Hypertension Paternal Grandmother   . Colon cancer Paternal Grandfather   . Hypertension Paternal Grandfather     ADVANCED DIRECTIVES (Y/N):  N  HEALTH MAINTENANCE: Social History  Substance Use Topics  . Smoking status: Never Smoker  . Smokeless tobacco: Never Used  . Alcohol use No     Colonoscopy:  PAP:  Bone density:  Lipid panel:  No Known Allergies  Current Outpatient Prescriptions  Medication Sig Dispense Refill  . ferrous sulfate 325 (65 FE) MG tablet Take 1 tablet (325 mg total) by mouth 2 (two) times daily with a meal. 60 tablet 3  . HYDROcodone-acetaminophen (NORCO) 5-325 MG tablet Take 1 tablet by mouth every 6 (six) hours as needed for severe pain. 6 tablet 0  . Prenatal Multivit-Min-Fe-FA (PRENATAL VITAMINS PO) Take 1 tablet by mouth every morning.      No current facility-administered medications for this visit.     OBJECTIVE: There were no vitals filed for this visit.   There is no  height or weight on file to calculate BMI.    ECOG FS:{CHL ONC [phone]}  General: Well-developed, well-nourished, no acute distress. Eyes: Pink conjunctiva, anicteric sclera. HEENT: Normocephalic, moist mucous membranes, clear oropharnyx. Lungs: Clear to auscultation bilaterally. Heart: Regular rate and rhythm. No rubs, murmurs, or gallops. Abdomen: Soft, nontender, nondistended. No organomegaly noted, normoactive bowel sounds. Musculoskeletal: No edema, cyanosis, or clubbing. Neuro: Alert, answering all questions appropriately. Cranial nerves grossly intact. Skin: No rashes or petechiae noted. Psych: Normal affect. Lymphatics: No cervical, calvicular, axillary or inguinal LAD.   LAB RESULTS:  Lab Results  Component Value Date   NA 136 11/19/2016   K 3.4 (L) 11/19/2016   CL 105 11/19/2016   CO2 23 11/19/2016   GLUCOSE 81 11/19/2016   BUN 9 11/19/2016   CREATININE <0.30 (L) 11/19/2016   CALCIUM 8.7 (L) 11/19/2016   PROT 7.2 11/19/2016   ALBUMIN 3.2 (L) 11/19/2016   AST 21 11/19/2016   ALT 13 (L) 11/19/2016   ALKPHOS 100 11/19/2016   BILITOT 0.7 11/19/2016   GFRNONAA NOT CALCULATED 11/19/2016   GFRAA NOT CALCULATED 11/19/2016    Lab Results  Component Value Date   WBC 8.6 11/19/2016   NEUTROABS 7.2 (H) 11/19/2016   HGB 8.3 (L) 11/19/2016   HCT 27.1 (L) 11/19/2016   MCV 62.7 (L) 11/19/2016   PLT 270 11/19/2016     STUDIES: [place] Abdomen Limited  Result Date: 11/19/2016 CLINICAL DATA:  Abdominal  pain of the right upper quadrant. EXAM: ULTRASOUND ABDOMEN LIMITED RIGHT UPPER QUADRANT COMPARISON:  None. FINDINGS: Gallbladder: No gallstones or wall thickening visualized. No sonographic Murphy sign noted by sonographer. Common bile duct: Diameter: 3.1 mm Liver: No focal lesion identified. Within normal limits in parenchymal echogenicity. Portal vein is patent on color Doppler imaging with normal direction of blood flow towards the liver. IMPRESSION: Normal right upper  quadrant ultrasound. Electronically Signed   By: Deatra Robinson M.D.   On: 11/19/2016 05:56    ASSESSMENT: Anemia affecting pregnancy in the third trimester.  PLAN:    1. Anemia affecting pregnancy in the third trimester:  Patient expressed understanding and was in agreement with this plan. She also understands that She can call clinic at any time with any questions, concerns, or complaints.   Cancer Staging No matching staging information was found for the patient.  Jeralyn Ruths, MD   11/22/2016 1:29 PM

## 2016-11-22 NOTE — Telephone Encounter (Signed)
Thank you :)

## 2016-11-26 ENCOUNTER — Inpatient Hospital Stay: Payer: Medicaid Other | Attending: Oncology | Admitting: Oncology

## 2016-11-26 DIAGNOSIS — D509 Iron deficiency anemia, unspecified: Secondary | ICD-10-CM | POA: Insufficient documentation

## 2016-11-26 DIAGNOSIS — D649 Anemia, unspecified: Secondary | ICD-10-CM | POA: Insufficient documentation

## 2016-11-26 DIAGNOSIS — Z331 Pregnant state, incidental: Secondary | ICD-10-CM | POA: Insufficient documentation

## 2016-11-26 DIAGNOSIS — Z800 Family history of malignant neoplasm of digestive organs: Secondary | ICD-10-CM | POA: Insufficient documentation

## 2016-11-26 DIAGNOSIS — Z79899 Other long term (current) drug therapy: Secondary | ICD-10-CM | POA: Insufficient documentation

## 2016-11-26 DIAGNOSIS — N920 Excessive and frequent menstruation with regular cycle: Secondary | ICD-10-CM | POA: Insufficient documentation

## 2016-11-26 DIAGNOSIS — E669 Obesity, unspecified: Secondary | ICD-10-CM | POA: Insufficient documentation

## 2016-11-26 DIAGNOSIS — Z803 Family history of malignant neoplasm of breast: Secondary | ICD-10-CM | POA: Insufficient documentation

## 2016-11-29 ENCOUNTER — Encounter: Payer: Medicaid Other | Admitting: Certified Nurse Midwife

## 2016-12-02 ENCOUNTER — Ambulatory Visit (INDEPENDENT_AMBULATORY_CARE_PROVIDER_SITE_OTHER): Payer: Medicaid Other | Admitting: Obstetrics & Gynecology

## 2016-12-02 ENCOUNTER — Telehealth: Payer: Self-pay | Admitting: Obstetrics and Gynecology

## 2016-12-02 VITALS — BP 110/60 | Wt 186.0 lb

## 2016-12-02 DIAGNOSIS — N760 Acute vaginitis: Secondary | ICD-10-CM

## 2016-12-02 DIAGNOSIS — Z98891 History of uterine scar from previous surgery: Secondary | ICD-10-CM

## 2016-12-02 DIAGNOSIS — O99013 Anemia complicating pregnancy, third trimester: Secondary | ICD-10-CM

## 2016-12-02 DIAGNOSIS — O0930 Supervision of pregnancy with insufficient antenatal care, unspecified trimester: Secondary | ICD-10-CM

## 2016-12-02 DIAGNOSIS — O0993 Supervision of high risk pregnancy, unspecified, third trimester: Secondary | ICD-10-CM

## 2016-12-02 DIAGNOSIS — B9689 Other specified bacterial agents as the cause of diseases classified elsewhere: Secondary | ICD-10-CM | POA: Diagnosis not present

## 2016-12-02 DIAGNOSIS — Z3A32 32 weeks gestation of pregnancy: Secondary | ICD-10-CM

## 2016-12-02 MED ORDER — HYDROCODONE-ACETAMINOPHEN 5-325 MG PO TABS: 1.0000 | ORAL_TABLET | Freq: Four times a day (QID) | ORAL | 0 refills | Status: DC | PRN

## 2016-12-02 MED ORDER — ESOMEPRAZOLE MAGNESIUM 20 MG PO CPDR: 20.0000 mg | DELAYED_RELEASE_CAPSULE | Freq: Every day | ORAL | 11 refills | Status: DC

## 2016-12-02 MED ORDER — METRONIDAZOLE 500 MG PO TABS: 500.0000 mg | ORAL_TABLET | Freq: Two times a day (BID) | ORAL | 0 refills | Status: AC

## 2016-12-02 NOTE — Progress Notes (Signed)
PNV, FMC, PTL precautions, Bleeding precautions. Prior CS for abruption, consider TOLAC vs repeat CS.    Desires to see if goes into labor, otherwise CS after EDC. WET PREP:   positive clue cells Findings are consistent with bacterial vaginosis. Tx- Flagyl Annamarie MajorPaul Elwin Tsou, MD, Merlinda FrederickFACOG [facility], [facility] 12/02/2016  11:40 AM

## 2016-12-02 NOTE — Telephone Encounter (Signed)
Patient is [facility] Dr[facility] Jean RosenthalJackson, Pre-admit Testing on 01/27/17, and OR on 01/28/17.

## 2016-12-02 NOTE — Telephone Encounter (Signed)
-----   Message from Nadara Mustardobert P Harris, MD sent at 12/02/2016 11:50 AM EDT ----- Regarding: surg Surgery Booking Request Patient Full Name:   MRN: [id number]  DOB: 02/14/1996  Surgeon: Edison PaceS JACKSON, MD  Requested Surgery Date and Time: Jan 28 2017 Primary Diagnosis AND Code: Repeat CS at term Secondary Diagnosis and Code:  Surgical Procedure: Cesarean Section L&D Notification: Yes Admission Status: surgery admit Length of Surgery: 1 Special Case Needs: no H&P: yes (date) Phone Interview???: no Interpreter: Language:  Medical Clearance: no Special Scheduling Instructions: no

## 2016-12-02 NOTE — Patient Instructions (Addendum)
Flagyl antibiotic for 1 week   Third Trimester of Pregnancy The third trimester is from week 28 through week 40 (months 7 through 9). The third trimester is a time when the unborn baby (fetus) is growing rapidly. At the end of the ninth month, the fetus is about 20 inches in length and weighs 6-10 pounds. Body changes during your third trimester Your body will continue to go through many changes during pregnancy. The changes vary from woman to woman. During the third trimester:  Your weight will continue to increase. You can expect to gain 25-35 pounds (11-16 kg) by the end of the pregnancy.  You may begin to get stretch marks on your hips, abdomen, and breasts.  You may urinate more often because the fetus is moving lower into your pelvis and pressing on your bladder.  You may develop or continue to have heartburn. This is caused by increased hormones that slow down muscles in the digestive tract.  You may develop or continue to have constipation because increased hormones slow digestion and cause the muscles that push waste through your intestines to relax.  You may develop hemorrhoids. These are swollen veins (varicose veins) in the rectum that can itch or be painful.  You may develop swollen, bulging veins (varicose veins) in your legs.  You may have increased body aches in the pelvis, back, or thighs. This is due to weight gain and increased hormones that are relaxing your joints.  You may have changes in your hair. These can include thickening of your hair, rapid growth, and changes in texture. Some women also have hair loss during or after pregnancy, or hair that feels dry or thin. Your hair will most likely return to normal after your baby is born.  Your breasts will continue to grow and they will continue to become tender. A yellow fluid (colostrum) may leak from your breasts. This is the first milk you are producing for your baby.  Your belly button may stick out.  You may  notice more swelling in your hands, face, or ankles.  You may have increased tingling or numbness in your hands, arms, and legs. The skin on your belly may also feel numb.  You may feel short of breath because of your expanding uterus.  You may have more problems sleeping. This can be caused by the size of your belly, increased need to urinate, and an increase in your body's metabolism.  You may notice the fetus "dropping," or moving lower in your abdomen (lightening).  You may have increased vaginal discharge.  You may notice your joints feel loose and you may have pain around your pelvic bone.  What to expect at prenatal visits You will have prenatal exams every 2 weeks until week 36. Then you will have weekly prenatal exams. During a routine prenatal visit:  You will be weighed to make sure you and the baby are growing normally.  Your blood pressure will be taken.  Your abdomen will be measured to track your baby's growth.  The fetal heartbeat will be listened to.  Any test results from the previous visit will be discussed.  You may have a cervical check near your due date to see if your cervix has softened or thinned (effaced).  You will be tested for Group B streptococcus. This happens between 35 and 37 weeks.  Your health care provider may ask you:  What your birth plan is.  How you are feeling.  If you are feeling the  baby move.  If you have had any abnormal symptoms, such as leaking fluid, bleeding, severe headaches, or abdominal cramping.  If you are using any tobacco products, including cigarettes, chewing tobacco, and electronic cigarettes.  If you have any questions.  Other tests or screenings that may be performed during your third trimester include:  Blood tests that check for low iron levels (anemia).  Fetal testing to check the health, activity level, and growth of the fetus. Testing is done if you have certain medical conditions or if there are  problems during the pregnancy.  Nonstress test (NST). This test checks the health of your baby to make sure there are no signs of problems, such as the baby not getting enough oxygen. During this test, a belt is placed around your belly. The baby is made to move, and its heart rate is monitored during movement.  What is false labor? False labor is a condition in which you feel small, irregular tightenings of the muscles in the womb (contractions) that usually go away with rest, changing position, or drinking water. These are called Braxton Hicks contractions. Contractions may last for hours, days, or even weeks before true labor sets in. If contractions come at regular intervals, become more frequent, increase in intensity, or become painful, you should see your health care provider. What are the signs of labor?  Abdominal cramps.  Regular contractions that start at 10 minutes apart and become stronger and more frequent with time.  Contractions that start on the top of the uterus and spread down to the lower abdomen and back.  Increased pelvic pressure and dull back pain.  A watery or bloody mucus discharge that comes from the vagina.  Leaking of amniotic fluid. This is also known as your "water breaking." It could be a slow trickle or a gush. Let your health care provider know if it has a color or strange odor. If you have any of these signs, call your health care provider right away, even if it is before your due date. Follow these instructions at home: Medicines  Follow your health care provider's instructions regarding medicine use. Specific medicines may be either safe or unsafe to take during pregnancy.  Take a prenatal vitamin that contains at least 600 micrograms (mcg) of folic acid.  If you develop constipation, try taking a stool softener if your health care provider approves. Eating and drinking  Eat a balanced diet that includes fresh fruits and vegetables, whole grains,  good sources of protein such as meat, eggs, or tofu, and low-fat dairy. Your health care provider will help you determine the amount of weight gain that is right for you.  Avoid raw meat and uncooked cheese. These carry germs that can cause birth defects in the baby.  If you have low calcium intake from food, talk to your health care provider about whether you should take a daily calcium supplement.  Eat four or five small meals rather than three large meals a day.  Limit foods that are high in fat and processed sugars, such as fried and sweet foods.  To prevent constipation: ? Drink enough fluid to keep your urine clear or pale yellow. ? Eat foods that are high in fiber, such as fresh fruits and vegetables, whole grains, and beans. Activity  Exercise only as directed by your health care provider. Most women can continue their usual exercise routine during pregnancy. Try to exercise for 30 minutes at least 5 days a week. Stop exercising if  you experience uterine contractions.  Avoid heavy lifting.  Do not exercise in extreme heat or humidity, or at high altitudes.  Wear low-heel, comfortable shoes.  Practice good posture.  You may continue to have sex unless your health care provider tells you otherwise. Relieving pain and discomfort  Take frequent breaks and rest with your legs elevated if you have leg cramps or low back pain.  Take warm sitz baths to soothe any pain or discomfort caused by hemorrhoids. Use hemorrhoid cream if your health care provider approves.  Wear a good support bra to prevent discomfort from breast tenderness.  If you develop varicose veins: ? Wear support pantyhose or compression stockings as told by your healthcare provider. ? Elevate your feet for 15 minutes, 3-4 times a day. Prenatal care  Write down your questions. Take them to your prenatal visits.  Keep all your prenatal visits as told by your health care provider. This is  important. Safety  Wear your seat belt at all times when driving.  Make a list of emergency phone numbers, including numbers for family, friends, the hospital, and police and fire departments. General instructions  Avoid cat litter boxes and soil used by cats. These carry germs that can cause birth defects in the baby. If you have a cat, ask someone to clean the litter box for you.  Do not travel far distances unless it is absolutely necessary and only with the approval of your health care provider.  Do not use hot tubs, steam rooms, or saunas.  Do not drink alcohol.  Do not use any products that contain nicotine or tobacco, such as cigarettes and e-cigarettes. If you need help quitting, ask your health care provider.  Do not use any medicinal herbs or unprescribed drugs. These chemicals affect the formation and growth of the baby.  Do not douche or use tampons or scented sanitary pads.  Do not cross your legs for long periods of time.  To prepare for the arrival of your baby: ? Take prenatal classes to understand, practice, and ask questions about labor and delivery. ? Make a trial run to the hospital. ? Visit the hospital and tour the maternity area. ? Arrange for maternity or paternity leave through employers. ? Arrange for family and friends to take care of pets while you are in the hospital. ? Purchase a rear-facing car seat and make sure you know how to install it in your car. ? Pack your hospital bag. ? Prepare the baby's nursery. Make sure to remove all pillows and stuffed animals from the baby's crib to prevent suffocation.  Visit your dentist if you have not gone during your pregnancy. Use a soft toothbrush to brush your teeth and be gentle when you floss. Contact a health care provider if:  You are unsure if you are in labor or if your water has broken.  You become dizzy.  You have mild pelvic cramps, pelvic pressure, or nagging pain in your abdominal area.  You  have lower back pain.  You have persistent nausea, vomiting, or diarrhea.  You have an unusual or bad smelling vaginal discharge.  You have pain when you urinate. Get help right away if:  Your water breaks before 37 weeks.  You have regular contractions less than 5 minutes apart before 37 weeks.  You have a fever.  You are leaking fluid from your vagina.  You have spotting or bleeding from your vagina.  You have severe abdominal pain or cramping.  You  have rapid weight loss or weight gain.  You have shortness of breath with chest pain.  You notice sudden or extreme swelling of your face, hands, ankles, feet, or legs.  Your baby makes fewer than 10 movements in 2 hours.  You have severe headaches that do not go away when you take medicine.  You have vision changes. Summary  The third trimester is from week 28 through week 40, months 7 through 9. The third trimester is a time when the unborn baby (fetus) is growing rapidly.  During the third trimester, your discomfort may increase as you and your baby continue to gain weight. You may have abdominal, leg, and back pain, sleeping problems, and an increased need to urinate.  During the third trimester your breasts will keep growing and they will continue to become tender. A yellow fluid (colostrum) may leak from your breasts. This is the first milk you are producing for your baby.  False labor is a condition in which you feel small, irregular tightenings of the muscles in the womb (contractions) that eventually go away. These are called Braxton Hicks contractions. Contractions may last for hours, days, or even weeks before true labor sets in.  Signs of labor can include: abdominal cramps; regular contractions that start at 10 minutes apart and become stronger and more frequent with time; watery or bloody mucus discharge that comes from the vagina; increased pelvic pressure and dull back pain; and leaking of amniotic fluid. This  information is not intended to replace advice given to you by your health care provider. Make sure you discuss any questions you have with your health care provider. Document Released: 03/05/2001 Document Revised: 08/17/2015 Document Reviewed: 05/12/2012 Elsevier Interactive Patient Education  2017 [employer].

## 2016-12-07 NOTE — Progress Notes (Signed)
[facility]  [phone] [phone]  ID: MERCADIES CO OB: 03-21-1996  MR#: [id number]  CSN#:[id number]  Patient Care Team: Patient, No Pcp Per as PCP - General (General Practice)  CHIEF COMPLAINT: Anemia affecting pregnancy in the third trimester.  INTERVAL HISTORY: Patient is a 21 year old female in her third semester of pregnancy who was found to have a declining hemoglobin on routine blood work. She currently feels well and is asymptomatic. She has no neurologic complaints. She denies any recent fevers or illnesses. She has a good appetite and is gaining weight appropriately. She has no chest pain or shortness of breath. She denies any nausea, vomiting, constipation, or diarrhea. She has no urinary complaints. Patient feels at her baseline and offers no specific complaints today.  REVIEW OF SYSTEMS:   Review of Systems  Constitutional: Negative.  Negative for fever, malaise/fatigue and weight loss.  Respiratory: Negative.  Negative for cough, hemoptysis and shortness of breath.   Cardiovascular: Negative.  Negative for chest pain and leg swelling.  Gastrointestinal: Negative.  Negative for abdominal pain, blood in stool and melena.  Genitourinary: Negative.  Negative for hematuria.  Musculoskeletal: Negative.   Skin: Negative.  Negative for rash.  Neurological: Negative.  Negative for weakness.  Psychiatric/Behavioral: Negative.  The patient is not nervous/anxious.     As per HPI. Otherwise, a complete review of systems is negative.  PAST MEDICAL HISTORY: Past Medical History:  Diagnosis Date  . Allergy   . Anemia   . Dysmenorrhea   . Overweight   . Seborrhea     PAST SURGICAL HISTORY: Past Surgical History:  Procedure Laterality Date  . CESAREAN SECTION N/A 08/29/2015   Procedure: CESAREAN SECTION;  Surgeon: Vena Austria, MD;  Location: [facility] ORS;  Service: Obstetrics;  Laterality: N/A;  . WISDOM TOOTH EXTRACTION      FAMILY  HISTORY: Family History  Problem Relation Age of Onset  . Heart failure Father 45  . Breast cancer Paternal Grandmother   . Diabetes Paternal Grandmother   . Hypertension Paternal Grandmother   . Colon cancer Paternal Grandfather   . Hypertension Paternal Grandfather     ADVANCED DIRECTIVES (Y/N):  N  HEALTH MAINTENANCE: Social History  Substance Use Topics  . Smoking status: Never Smoker  . Smokeless tobacco: Never Used  . Alcohol use No     Colonoscopy:  PAP:  Bone density:  Lipid panel:  No Known Allergies  Current Outpatient Prescriptions  Medication Sig Dispense Refill  . ferrous sulfate 325 (65 FE) MG tablet Take 1 tablet (325 mg total) by mouth 2 (two) times daily with a meal. 60 tablet 3  . Prenatal Multivit-Min-Fe-FA (PRENATAL VITAMINS PO) Take 1 tablet by mouth every morning.     [name] HYDROcodone-acetaminophen (NORCO) 5-325 MG tablet Take 1 tablet by mouth every 6 (six) hours as needed for severe pain. (Patient not taking: Reported on 12/09/2016) 6 tablet 0  . metroNIDAZOLE (FLAGYL) 500 MG tablet Take 1 tablet (500 mg total) by mouth 2 (two) times daily. (Patient not taking: Reported on 12/09/2016) 14 tablet 0   No current facility-administered medications for this visit.     OBJECTIVE: Vitals:   12/09/16 1037  BP: 115/77  Pulse: 96  Resp: 18  Temp: (!) 97.1 F (36.2 C)     Body mass index is 30.47 kg/m.    ECOG FS:0 - Asymptomatic  General: Well-developed, well-nourished, no acute distress. Eyes: Pink conjunctiva, anicteric sclera. HEENT: Normocephalic, moist mucous membranes, clear oropharnyx.  Lungs: Clear to auscultation bilaterally. Heart: Regular rate and rhythm. No rubs, murmurs, or gallops. Abdomen: Appears appropriate for gestational age. Musculoskeletal: No edema, cyanosis, or clubbing. Neuro: Alert, answering all questions appropriately. Cranial nerves grossly intact. Skin: No rashes or petechiae noted. Psych: Normal affect. Lymphatics: No  cervical, calvicular, axillary or inguinal LAD.   LAB RESULTS:  Lab Results  Component Value Date   NA 136 11/19/2016   K 3.4 (L) 11/19/2016   CL 105 11/19/2016   CO2 23 11/19/2016   GLUCOSE 81 11/19/2016   BUN 9 11/19/2016   CREATININE <0.30 (L) 11/19/2016   CALCIUM 8.7 (L) 11/19/2016   PROT 7.2 11/19/2016   ALBUMIN 3.2 (L) 11/19/2016   AST 21 11/19/2016   ALT 13 (L) 11/19/2016   ALKPHOS 100 11/19/2016   BILITOT 0.7 11/19/2016   GFRNONAA NOT CALCULATED 11/19/2016   GFRAA NOT CALCULATED 11/19/2016    Lab Results  Component Value Date   WBC 8.0 12/09/2016   NEUTROABS 7.2 (H) 11/19/2016   HGB 7.7 (L) 12/09/2016   HCT 24.7 (L) 12/09/2016   MCV 61.7 (L) 12/09/2016   PLT 274 12/09/2016     STUDIES: [place] Abdomen Limited  Result Date: 11/19/2016 CLINICAL DATA:  Abdominal pain of the right upper quadrant. EXAM: ULTRASOUND ABDOMEN LIMITED RIGHT UPPER QUADRANT COMPARISON:  None. FINDINGS: Gallbladder: No gallstones or wall thickening visualized. No sonographic Murphy sign noted by sonographer. Common bile duct: Diameter: 3.1 mm Liver: No focal lesion identified. Within normal limits in parenchymal echogenicity. Portal vein is patent on color Doppler imaging with normal direction of blood flow towards the liver. IMPRESSION: Normal right upper quadrant ultrasound. Electronically Signed   By: Deatra Robinson M.D.   On: 11/19/2016 05:56    ASSESSMENT: Anemia affecting pregnancy in the third trimester.  PLAN:    1. Anemia affecting pregnancy in the third trimester: Patient's hemoglobin has significantly declined and is 7.7 today. Her iron stores are pending, but she has a decreased MCV of 61.7. The remainder of her laboratory work from today is also pending at time of dictation. Patient will return to clinic later this week and again next week to receive 510 mg IV Feraheme. She will then return to clinic the last week of October just prior to her due date for repeat laboratory work,  further evaluation, an additional IV iron if necessary. 2. Pregnancy: Patient reports she is a 35 weeks although does not have a scheduled C-section until January 28, 2017. Continue monitoring per OB/GYN. Follow-up as above.  Approximate 45 minutes was spent in discussion of which greater than 50% was consultation.  Patient expressed understanding and was in agreement with this plan. She also understands that She can call clinic at any time with any questions, concerns, or complaints.    Jeralyn Ruths, MD   12/09/2016 12:29 PM

## 2016-12-09 ENCOUNTER — Inpatient Hospital Stay: Payer: Medicaid Other

## 2016-12-09 ENCOUNTER — Encounter: Payer: Self-pay | Admitting: Oncology

## 2016-12-09 ENCOUNTER — Encounter: Payer: Medicaid Other | Admitting: Obstetrics and Gynecology

## 2016-12-09 ENCOUNTER — Inpatient Hospital Stay (HOSPITAL_BASED_OUTPATIENT_CLINIC_OR_DEPARTMENT_OTHER): Payer: Medicaid Other | Admitting: Oncology

## 2016-12-09 VITALS — BP 115/77 | HR 96 | Temp 97.1°F | Resp 18 | Wt 188.8 lb

## 2016-12-09 DIAGNOSIS — D509 Iron deficiency anemia, unspecified: Secondary | ICD-10-CM | POA: Diagnosis not present

## 2016-12-09 DIAGNOSIS — N920 Excessive and frequent menstruation with regular cycle: Secondary | ICD-10-CM

## 2016-12-09 DIAGNOSIS — D649 Anemia, unspecified: Secondary | ICD-10-CM | POA: Diagnosis not present

## 2016-12-09 DIAGNOSIS — E669 Obesity, unspecified: Secondary | ICD-10-CM | POA: Diagnosis not present

## 2016-12-09 DIAGNOSIS — O99013 Anemia complicating pregnancy, third trimester: Secondary | ICD-10-CM

## 2016-12-09 DIAGNOSIS — Z800 Family history of malignant neoplasm of digestive organs: Secondary | ICD-10-CM

## 2016-12-09 DIAGNOSIS — Z79899 Other long term (current) drug therapy: Secondary | ICD-10-CM | POA: Diagnosis not present

## 2016-12-09 DIAGNOSIS — Z331 Pregnant state, incidental: Secondary | ICD-10-CM | POA: Diagnosis not present

## 2016-12-09 DIAGNOSIS — Z803 Family history of malignant neoplasm of breast: Secondary | ICD-10-CM

## 2016-12-09 LAB — DAT, POLYSPECIFIC AHG (ARMC ONLY): Polyspecific AHG test: NEGATIVE

## 2016-12-09 LAB — IRON AND TIBC
Iron: 14 ug/dL — ABNORMAL LOW (ref 28–170)
Saturation Ratios: 3 % — ABNORMAL LOW (ref 10.4–31.8)
TIBC: 527 ug/dL — ABNORMAL HIGH (ref 250–450)
UIBC: 513 ug/dL

## 2016-12-09 LAB — CBC
HCT: 24.7 % — ABNORMAL LOW (ref 35.0–47.0)
Hemoglobin: 7.7 g/dL — ABNORMAL LOW (ref 12.0–16.0)
MCH: 19.1 pg — ABNORMAL LOW (ref 26.0–34.0)
MCHC: 31 g/dL — ABNORMAL LOW (ref 32.0–36.0)
MCV: 61.7 fL — ABNORMAL LOW (ref 80.0–100.0)
Platelets: 274 10*3/uL (ref 150–440)
RBC: 4.01 MIL/uL (ref 3.80–5.20)
RDW: 20.6 % — ABNORMAL HIGH (ref 11.5–14.5)
WBC: 8 10*3/uL (ref 3.6–11.0)

## 2016-12-09 LAB — LACTATE DEHYDROGENASE: LDH: 104 U/L (ref 98–192)

## 2016-12-09 LAB — FERRITIN: Ferritin: 4 ng/mL — ABNORMAL LOW (ref 11–307)

## 2016-12-09 LAB — FOLATE: Folate: 11.8 ng/mL (ref >5.9)

## 2016-12-10 ENCOUNTER — Ambulatory Visit (INDEPENDENT_AMBULATORY_CARE_PROVIDER_SITE_OTHER): Payer: Medicaid Other | Admitting: Obstetrics and Gynecology

## 2016-12-10 VITALS — BP 112/74 | Wt 188.0 lb

## 2016-12-10 DIAGNOSIS — N760 Acute vaginitis: Secondary | ICD-10-CM | POA: Diagnosis not present

## 2016-12-10 DIAGNOSIS — O99013 Anemia complicating pregnancy, third trimester: Secondary | ICD-10-CM

## 2016-12-10 DIAGNOSIS — Z3A33 33 weeks gestation of pregnancy: Secondary | ICD-10-CM

## 2016-12-10 DIAGNOSIS — Z98891 History of uterine scar from previous surgery: Secondary | ICD-10-CM

## 2016-12-10 DIAGNOSIS — B3731 Acute candidiasis of vulva and vagina: Secondary | ICD-10-CM

## 2016-12-10 DIAGNOSIS — O0993 Supervision of high risk pregnancy, unspecified, third trimester: Secondary | ICD-10-CM

## 2016-12-10 DIAGNOSIS — B373 Candidiasis of vulva and vagina: Secondary | ICD-10-CM | POA: Diagnosis not present

## 2016-12-10 LAB — HAPTOGLOBIN: Haptoglobin: 165 mg/dL (ref 34–200)

## 2016-12-10 LAB — POCT WET PREP WITH KOH
Clue Cells Wet Prep HPF POC: NEGATIVE
KOH Prep POC: POSITIVE — AB
PH, VAGINAL: 4.5
Trichomonas, UA: NEGATIVE

## 2016-12-10 LAB — VITAMIN B12: Vitamin B-12: 359 pg/mL (ref 180–914)

## 2016-12-10 MED ORDER — CLOTRIMAZOLE 1 % VA CREA: 1.0000 | TOPICAL_CREAM | Freq: Every day | VAGINAL | 0 refills | Status: AC

## 2016-12-10 NOTE — Progress Notes (Signed)
  Routine Prenatal Care Visit  Subjective  Hailey Martin is a 21 y.o. G3P1011 at [redacted]w[redacted]d being seen today for ongoing prenatal care.  She is currently monitored for the following issues for this high-risk pregnancy and has Dysmenorrhea; History of cervicitis; Allergic rhinitis, seasonal; Seborrhea capitis; Iron deficiency anemia; Headache; Marijuana use; Previous cesarean section; Late prenatal care; Supervision of high-risk pregnancy; Anemia affecting pregnancy in third trimester; Abdominal pain during pregnancy, third trimester; and BV (bacterial vaginosis) on her problem list.  ----------------------------------------------------------------------------------- Patient reports: continued itching at introitus since starting treatment last week.  Denies fevers and chills. States she took the entire course of Flagyl prescribed by Dr Tiburcio Pea. She has had mild improvement in symptoms, but not completely. Discharge is thick and white.     Contractions: Not present. Vag. Bleeding: None.  Movement: Present. Denies leaking of fluid.  ----------------------------------------------------------------------------------- The following portions of the patient's history were reviewed and updated as appropriate: allergies, current medications, past family history, past medical history, past social history, past surgical history and problem list. Problem list updated.  Objective  Blood pressure 112/74, weight 188 lb (85.3 kg), last menstrual period 04/20/2016, unknown if currently breastfeeding. Pregravid weight 155 lb (70.3 kg) Total Weight Gain 33 lb (15 kg) Urinalysis: Urine Protein: Negative Urine Glucose: Negative  Fetal Status: Fetal Heart Rate (bpm): 145   Movement: Present     General:  Alert, oriented and cooperative. Patient is in no acute distress.  Skin: Skin is warm and dry. No rash noted.   Cardiovascular: Normal heart rate noted  Respiratory: Normal respiratory effort, no problems with respiration  noted  Abdomen: Soft, gravid, appropriate for gestational age. Pain/Pressure: Absent     Pelvic:  NEFG, normally rugatted vaginal epithelium, cervix without lesion and appears closed.        Extremities: Normal range of motion.  Edema: None  Mental Status: Normal mood and affect. Normal behavior. Normal judgment and thought content.   Female chaperone present for pelvic exam:  Wet Prep PH: 4.5 Clue Cells: Negative Fungal elements: Positive Trichomonas: Negative   Assessment   21 y.o. G3P1011 at [redacted]w[redacted]d by  01/25/2017, by Last Menstrual Period presenting for work-in prenatal visit, acute vaginitis, diagnosed with candida vulvovaginitis  Plan   Candida vulvovaginitis  Preterm labor symptoms and general obstetric precautions including but not limited to vaginal bleeding, contractions, leaking of fluid and fetal movement were reviewed in detail with the patient. Please refer to After Visit Summary for other counseling recommendations.   Return in about 1 week (around 12/17/2016) for Routine Prenatal Appointment.  Thomasene Mohair, MD  12/10/2016 1:40 PM

## 2016-12-11 ENCOUNTER — Inpatient Hospital Stay: Payer: Medicaid Other

## 2016-12-11 VITALS — BP 112/73 | HR 102 | Resp 20

## 2016-12-11 DIAGNOSIS — D509 Iron deficiency anemia, unspecified: Secondary | ICD-10-CM | POA: Diagnosis not present

## 2016-12-11 DIAGNOSIS — O99013 Anemia complicating pregnancy, third trimester: Secondary | ICD-10-CM

## 2016-12-11 MED ORDER — SODIUM CHLORIDE 0.9 % IV SOLN
510.0000 mg | Freq: Once | INTRAVENOUS | Status: AC
  Administered 2016-12-11: 510 mg via INTRAVENOUS
  Filled 2016-12-11: qty 17

## 2016-12-11 MED ORDER — SODIUM CHLORIDE 0.9 % IV SOLN
Freq: Once | INTRAVENOUS | Status: AC
  Administered 2016-12-11: 14:00:00 via INTRAVENOUS
  Filled 2016-12-11: qty 1000

## 2016-12-12 LAB — CHLAMYDIA/GONOCOCCUS/TRICHOMONAS, NAA
Chlamydia by NAA: NEGATIVE
Gonococcus by NAA: NEGATIVE
Trich vag by NAA: NEGATIVE

## 2016-12-16 ENCOUNTER — Ambulatory Visit (INDEPENDENT_AMBULATORY_CARE_PROVIDER_SITE_OTHER): Payer: Medicaid Other | Admitting: Obstetrics & Gynecology

## 2016-12-16 VITALS — BP 120/80 | Wt 192.0 lb

## 2016-12-16 DIAGNOSIS — Z3A34 34 weeks gestation of pregnancy: Secondary | ICD-10-CM

## 2016-12-16 DIAGNOSIS — Z98891 History of uterine scar from previous surgery: Secondary | ICD-10-CM

## 2016-12-16 DIAGNOSIS — O0993 Supervision of high risk pregnancy, unspecified, third trimester: Secondary | ICD-10-CM

## 2016-12-16 NOTE — Progress Notes (Signed)
PNV, FMC, PTL precautions, Plans TOLAC (counseled pros and cons) CS scheduled in case no labor or favorable cervix after due date (nov 6)

## 2016-12-16 NOTE — Patient Instructions (Signed)
Third Trimester of Pregnancy The third trimester is from week 28 through week 40 (months 7 through 9). The third trimester is a time when the unborn baby (fetus) is growing rapidly. At the end of the ninth month, the fetus is about 20 inches in length and weighs 6-10 pounds. Body changes during your third trimester Your body will continue to go through many changes during pregnancy. The changes vary from woman to woman. During the third trimester:  Your weight will continue to increase. You can expect to gain 25-35 pounds (11-16 kg) by the end of the pregnancy.  You may begin to get stretch marks on your hips, abdomen, and breasts.  You may urinate more often because the fetus is moving lower into your pelvis and pressing on your bladder.  You may develop or continue to have heartburn. This is caused by increased hormones that slow down muscles in the digestive tract.  You may develop or continue to have constipation because increased hormones slow digestion and cause the muscles that push waste through your intestines to relax.  You may develop hemorrhoids. These are swollen veins (varicose veins) in the rectum that can itch or be painful.  You may develop swollen, bulging veins (varicose veins) in your legs.  You may have increased body aches in the pelvis, back, or thighs. This is due to weight gain and increased hormones that are relaxing your joints.  You may have changes in your hair. These can include thickening of your hair, rapid growth, and changes in texture. Some women also have hair loss during or after pregnancy, or hair that feels dry or thin. Your hair will most likely return to normal after your baby is born.  Your breasts will continue to grow and they will continue to become tender. A yellow fluid (colostrum) may leak from your breasts. This is the first milk you are producing for your baby.  Your belly button may stick out.  You may notice more swelling in your hands,  face, or ankles.  You may have increased tingling or numbness in your hands, arms, and legs. The skin on your belly may also feel numb.  You may feel short of breath because of your expanding uterus.  You may have more problems sleeping. This can be caused by the size of your belly, increased need to urinate, and an increase in your body's metabolism.  You may notice the fetus "dropping," or moving lower in your abdomen (lightening).  You may have increased vaginal discharge.  You may notice your joints feel loose and you may have pain around your pelvic bone.  What to expect at prenatal visits You will have prenatal exams every 2 weeks until week 36. Then you will have weekly prenatal exams. During a routine prenatal visit:  You will be weighed to make sure you and the baby are growing normally.  Your blood pressure will be taken.  Your abdomen will be measured to track your baby's growth.  The fetal heartbeat will be listened to.  Any test results from the previous visit will be discussed.  You may have a cervical check near your due date to see if your cervix has softened or thinned (effaced).  You will be tested for Group B streptococcus. This happens between 35 and 37 weeks.  Your health care provider may ask you:  What your birth plan is.  How you are feeling.  If you are feeling the baby move.  If you have had   any abnormal symptoms, such as leaking fluid, bleeding, severe headaches, or abdominal cramping.  If you are using any tobacco products, including cigarettes, chewing tobacco, and electronic cigarettes.  If you have any questions.  Other tests or screenings that may be performed during your third trimester include:  Blood tests that check for low iron levels (anemia).  Fetal testing to check the health, activity level, and growth of the fetus. Testing is done if you have certain medical conditions or if there are problems during the  pregnancy.  Nonstress test (NST). This test checks the health of your baby to make sure there are no signs of problems, such as the baby not getting enough oxygen. During this test, a belt is placed around your belly. The baby is made to move, and its heart rate is monitored during movement.  What is false labor? False labor is a condition in which you feel small, irregular tightenings of the muscles in the womb (contractions) that usually go away with rest, changing position, or drinking water. These are called Braxton Hicks contractions. Contractions may last for hours, days, or even weeks before true labor sets in. If contractions come at regular intervals, become more frequent, increase in intensity, or become painful, you should see your health care provider. What are the signs of labor?  Abdominal cramps.  Regular contractions that start at 10 minutes apart and become stronger and more frequent with time.  Contractions that start on the top of the uterus and spread down to the lower abdomen and back.  Increased pelvic pressure and dull back pain.  A watery or bloody mucus discharge that comes from the vagina.  Leaking of amniotic fluid. This is also known as your "water breaking." It could be a slow trickle or a gush. Let your health care provider know if it has a color or strange odor. If you have any of these signs, call your health care provider right away, even if it is before your due date. Follow these instructions at home: Medicines  Follow your health care provider's instructions regarding medicine use. Specific medicines may be either safe or unsafe to take during pregnancy.  Take a prenatal vitamin that contains at least 600 micrograms (mcg) of folic acid.  If you develop constipation, try taking a stool softener if your health care provider approves. Eating and drinking  Eat a balanced diet that includes fresh fruits and vegetables, whole grains, good sources of protein  such as meat, eggs, or tofu, and low-fat dairy. Your health care provider will help you determine the amount of weight gain that is right for you.  Avoid raw meat and uncooked cheese. These carry germs that can cause birth defects in the baby.  If you have low calcium intake from food, talk to your health care provider about whether you should take a daily calcium supplement.  Eat four or five small meals rather than three large meals a day.  Limit foods that are high in fat and processed sugars, such as fried and sweet foods.  To prevent constipation: ? Drink enough fluid to keep your urine clear or pale yellow. ? Eat foods that are high in fiber, such as fresh fruits and vegetables, whole grains, and beans. Activity  Exercise only as directed by your health care provider. Most women can continue their usual exercise routine during pregnancy. Try to exercise for 30 minutes at least 5 days a week. Stop exercising if you experience uterine contractions.  Avoid heavy   lifting.  Do not exercise in extreme heat or humidity, or at high altitudes.  Wear low-heel, comfortable shoes.  Practice good posture.  You may continue to have sex unless your health care provider tells you otherwise. Relieving pain and discomfort  Take frequent breaks and rest with your legs elevated if you have leg cramps or low back pain.  Take warm sitz baths to soothe any pain or discomfort caused by hemorrhoids. Use hemorrhoid cream if your health care provider approves.  Wear a good support bra to prevent discomfort from breast tenderness.  If you develop varicose veins: ? Wear support pantyhose or compression stockings as told by your healthcare provider. ? Elevate your feet for 15 minutes, 3-4 times a day. Prenatal care  Write down your questions. Take them to your prenatal visits.  Keep all your prenatal visits as told by your health care provider. This is important. Safety  Wear your seat belt at  all times when driving.  Make a list of emergency phone numbers, including numbers for family, friends, the hospital, and police and fire departments. General instructions  Avoid cat litter boxes and soil used by cats. These carry germs that can cause birth defects in the baby. If you have a cat, ask someone to clean the litter box for you.  Do not travel far distances unless it is absolutely necessary and only with the approval of your health care provider.  Do not use hot tubs, steam rooms, or saunas.  Do not drink alcohol.  Do not use any products that contain nicotine or tobacco, such as cigarettes and e-cigarettes. If you need help quitting, ask your health care provider.  Do not use any medicinal herbs or unprescribed drugs. These chemicals affect the formation and growth of the baby.  Do not douche or use tampons or scented sanitary pads.  Do not cross your legs for long periods of time.  To prepare for the arrival of your baby: ? Take prenatal classes to understand, practice, and ask questions about labor and delivery. ? Make a trial run to the hospital. ? Visit the hospital and tour the maternity area. ? Arrange for maternity or paternity leave through employers. ? Arrange for family and friends to take care of pets while you are in the hospital. ? Purchase a rear-facing car seat and make sure you know how to install it in your car. ? Pack your hospital bag. ? Prepare the baby's nursery. Make sure to remove all pillows and stuffed animals from the baby's crib to prevent suffocation.  Visit your dentist if you have not gone during your pregnancy. Use a soft toothbrush to brush your teeth and be gentle when you floss. Contact a health care provider if:  You are unsure if you are in labor or if your water has broken.  You become dizzy.  You have mild pelvic cramps, pelvic pressure, or nagging pain in your abdominal area.  You have lower back pain.  You have persistent  nausea, vomiting, or diarrhea.  You have an unusual or bad smelling vaginal discharge.  You have pain when you urinate. Get help right away if:  Your water breaks before 37 weeks.  You have regular contractions less than 5 minutes apart before 37 weeks.  You have a fever.  You are leaking fluid from your vagina.  You have spotting or bleeding from your vagina.  You have severe abdominal pain or cramping.  You have rapid weight loss or weight gain.    You have shortness of breath with chest pain.  You notice sudden or extreme swelling of your face, hands, ankles, feet, or legs.  Your baby makes fewer than 10 movements in 2 hours.  You have severe headaches that do not go away when you take medicine.  You have vision changes. Summary  The third trimester is from week 28 through week 40, months 7 through 9. The third trimester is a time when the unborn baby (fetus) is growing rapidly.  During the third trimester, your discomfort may increase as you and your baby continue to gain weight. You may have abdominal, leg, and back pain, sleeping problems, and an increased need to urinate.  During the third trimester your breasts will keep growing and they will continue to become tender. A yellow fluid (colostrum) may leak from your breasts. This is the first milk you are producing for your baby.  False labor is a condition in which you feel small, irregular tightenings of the muscles in the womb (contractions) that eventually go away. These are called Braxton Hicks contractions. Contractions may last for hours, days, or even weeks before true labor sets in.  Signs of labor can include: abdominal cramps; regular contractions that start at 10 minutes apart and become stronger and more frequent with time; watery or bloody mucus discharge that comes from the vagina; increased pelvic pressure and dull back pain; and leaking of amniotic fluid. This information is not intended to replace advice  given to you by your health care provider. Make sure you discuss any questions you have with your health care provider. Document Released: 03/05/2001 Document Revised: 08/17/2015 Document Reviewed: 05/12/2012 Elsevier Interactive Patient Education  2017 [employer]  

## 2016-12-18 ENCOUNTER — Inpatient Hospital Stay: Payer: Medicaid Other

## 2016-12-18 DIAGNOSIS — D509 Iron deficiency anemia, unspecified: Secondary | ICD-10-CM | POA: Diagnosis not present

## 2016-12-18 DIAGNOSIS — O99013 Anemia complicating pregnancy, third trimester: Secondary | ICD-10-CM

## 2016-12-18 MED ORDER — SODIUM CHLORIDE 0.9 % IV SOLN
Freq: Once | INTRAVENOUS | Status: AC
  Administered 2016-12-18: 14:00:00 via INTRAVENOUS
  Filled 2016-12-18: qty 1000

## 2016-12-18 MED ORDER — SODIUM CHLORIDE 0.9 % IV SOLN
510.0000 mg | Freq: Once | INTRAVENOUS | Status: AC
  Administered 2016-12-18: 510 mg via INTRAVENOUS
  Filled 2016-12-18: qty 17

## 2016-12-30 ENCOUNTER — Encounter: Payer: Medicaid Other | Admitting: Certified Nurse Midwife

## 2016-12-30 ENCOUNTER — Encounter: Payer: Medicaid Other | Admitting: Obstetrics & Gynecology

## 2016-12-30 ENCOUNTER — Ambulatory Visit (INDEPENDENT_AMBULATORY_CARE_PROVIDER_SITE_OTHER): Payer: Medicaid Other | Admitting: Certified Nurse Midwife

## 2016-12-30 VITALS — BP 114/60 | Wt 190.0 lb

## 2016-12-30 DIAGNOSIS — Z3685 Encounter for antenatal screening for Streptococcus B: Secondary | ICD-10-CM

## 2016-12-30 DIAGNOSIS — Z3A36 36 weeks gestation of pregnancy: Secondary | ICD-10-CM

## 2016-12-30 DIAGNOSIS — Z113 Encounter for screening for infections with a predominantly sexual mode of transmission: Secondary | ICD-10-CM

## 2016-12-30 DIAGNOSIS — O0993 Supervision of high risk pregnancy, unspecified, third trimester: Secondary | ICD-10-CM

## 2016-12-30 NOTE — Progress Notes (Signed)
Pt reports no problems. GBS/aptima today.  

## 2016-12-31 NOTE — Progress Notes (Signed)
ROB: G3 P1011 with EDC=01/25/2017 now 36week2days.  Feeling good FM. Denies regular contractions, LOF or bleeding. Is feeling more pressure and discomfort Pregnancy complicated by anemia. She has already received 2 IV iron infusions. Has not had a CBC since those infusions. Has another appointment with hematology soon Prior CS: considering TOLAC, but has repeat CS scheduled 11/6 in case she does not go into labor or has unripe cervix. GBS and Aptima today Bottle/ IUD? Labor precautions ROB in 1 week Farrel Conners, CNM

## 2017-01-01 LAB — STREP GP B NAA: Strep Gp B NAA: POSITIVE — AB

## 2017-01-02 LAB — CHLAMYDIA/GONOCOCCUS/TRICHOMONAS, NAA
Chlamydia by NAA: NEGATIVE
Gonococcus by NAA: NEGATIVE
Trich vag by NAA: NEGATIVE

## 2017-01-06 ENCOUNTER — Ambulatory Visit (INDEPENDENT_AMBULATORY_CARE_PROVIDER_SITE_OTHER): Payer: Medicaid Other | Admitting: Maternal Newborn

## 2017-01-06 VITALS — BP 124/64 | Wt 191.0 lb

## 2017-01-06 DIAGNOSIS — Z3A37 37 weeks gestation of pregnancy: Secondary | ICD-10-CM

## 2017-01-06 DIAGNOSIS — O0993 Supervision of high risk pregnancy, unspecified, third trimester: Secondary | ICD-10-CM

## 2017-01-06 NOTE — Progress Notes (Signed)
Routine Prenatal Care Visit  Subjective  Hailey Martin is a 21 y.o. G3P1011 at [redacted]w[redacted]d being seen today for ongoing prenatal care.  She is currently monitored for the following issues for this high-risk pregnancy and has Dysmenorrhea; History of cervicitis; Allergic rhinitis, seasonal; Seborrhea capitis; Iron deficiency anemia; Headache; Marijuana use; Previous cesarean section; Late prenatal care; Supervision of high-risk pregnancy; Anemia affecting pregnancy in third trimester; Abdominal pain during pregnancy, third trimester; and BV (bacterial vaginosis) on her problem list.  ----------------------------------------------------------------------------------- Patient reports pelvic pressure.   Contractions: Not present. Vag. Bleeding: None.  Movement: Present. Denies leaking of fluid.  ----------------------------------------------------------------------------------- The following portions of the patient's history were reviewed and updated as appropriate: allergies, current medications, past family history, past medical history, past social history, past surgical history and problem list. Problem list updated.   Objective  Blood pressure 124/64, weight 191 lb (86.6 kg), last menstrual period 04/20/2016, unknown if currently breastfeeding. Pregravid weight 155 lb (70.3 kg) Total Weight Gain 36 lb (16.3 kg) Urinalysis: Urine Protein: Trace Urine Glucose: Negative  Fetal Status: Fetal Heart Rate (bpm): 148 Fundal Height: 36 cm Movement: Present  Presentation: Vertex  General:  Alert, oriented and cooperative. Patient is in no acute distress.  Skin: Skin is warm and dry. No rash noted.   Cardiovascular: Normal heart rate noted  Respiratory: Normal respiratory effort, no problems with respiration noted  Abdomen: Soft, gravid, appropriate for gestational age. Pain/Pressure: Present     Pelvic:  Cervical exam performed Dilation: Closed Effacement (%): 30 Station: -2  Extremities: Normal range  of motion.     Mental Status: Normal mood and affect. Normal behavior. Normal judgment and thought content.     Assessment   21 y.o. G3P1011 at [redacted]w[redacted]d by  01/25/2017, by Last Menstrual Period presenting for routine prenatal visit.  Plan   lmp  Problems (from 08/16/16 to present)    Problem Noted Resolved   Supervision of high-risk pregnancy 11/15/2016 by Farrel Conners, CNM No   Overview Addendum 11/17/2016  3:46 PM by Farrel Conners, CNM     [facility] Prenatal Labs  Dating LMP=17 week ultrasound Blood type: A/Positive/-- (05/25 1453)   Genetic Screen 1 Screen:    AFP:     Quad:     NIPS: Antibody:Negative (05/25 1453)  Anatomic [place]  Rubella: 6.39 (05/25 1453) VI  GTT Early:               Third trimester: 76 RPR: Non Reactive (05/25 1453)   Flu vaccine  HBsAg: Negative (05/25 1453)   TDaP vaccine          11/14/2016                                     Rhogam: HIV:   negative  Baby Food       Bottle                                        GBS: (For PCN allergy, check sensitivities)  Contraception ?IUD Pap:  Circumcision  Anemia with H&H 7.8/26.7% at 42 weeks-hematology referral  Pediatrician    Support Person               Per hematology, patient will have lab draw and additional IV iron, if needed,  in last week of October.  Feeling lots of pressure, but no contractions.  Term labor symptoms and general obstetric precautions including but not limited to vaginal bleeding, contractions, leaking of fluid and fetal movement were reviewed in detail with the patient.  Return in about 1 week (around 01/13/2017) for ROB.  Marcelyn Bruins, CNM 01/06/2017  12:04 PM

## 2017-01-13 ENCOUNTER — Ambulatory Visit (INDEPENDENT_AMBULATORY_CARE_PROVIDER_SITE_OTHER): Payer: Medicaid Other | Admitting: Certified Nurse Midwife

## 2017-01-13 VITALS — BP 112/62 | Wt 191.0 lb

## 2017-01-13 DIAGNOSIS — O0993 Supervision of high risk pregnancy, unspecified, third trimester: Secondary | ICD-10-CM

## 2017-01-13 DIAGNOSIS — Z3A38 38 weeks gestation of pregnancy: Secondary | ICD-10-CM

## 2017-01-13 NOTE — Progress Notes (Signed)
Pt reports intermittent ctx. Desires cervical check.

## 2017-01-17 ENCOUNTER — Other Ambulatory Visit: Payer: Self-pay | Admitting: *Deleted

## 2017-01-17 DIAGNOSIS — D649 Anemia, unspecified: Secondary | ICD-10-CM

## 2017-01-17 NOTE — Progress Notes (Deleted)
[facility]  [phone] [phone]) [phone]  ID: Hailey KillianJayla R Schwartzman OB: 10/03/1995  MR#: [id number]  JSE#:[id number]#:[id number]  Patient Care Team: Patient, No Pcp Per as PCP - General (General Practice)  CHIEF COMPLAINT: Anemia affecting pregnancy in the third trimester.  INTERVAL HISTORY: Patient is a 21 year old female in her third semester of pregnancy who was found to have a declining hemoglobin on routine blood work. She currently feels well and is asymptomatic. She has no neurologic complaints. She denies any recent fevers or illnesses. She has a good appetite and is gaining weight appropriately. She has no chest pain or shortness of breath. She denies any nausea, vomiting, constipation, or diarrhea. She has no urinary complaints. Patient feels at her baseline and offers no specific complaints today.  REVIEW OF SYSTEMS:   Review of Systems  Constitutional: Negative.  Negative for fever, malaise/fatigue and weight loss.  Respiratory: Negative.  Negative for cough, hemoptysis and shortness of breath.   Cardiovascular: Negative.  Negative for chest pain and leg swelling.  Gastrointestinal: Negative.  Negative for abdominal pain, blood in stool and melena.  Genitourinary: Negative.  Negative for hematuria.  Musculoskeletal: Negative.   Skin: Negative.  Negative for rash.  Neurological: Negative.  Negative for weakness.  Psychiatric/Behavioral: Negative.  The patient is not nervous/anxious.     As per HPI. Otherwise, a complete review of systems is negative.  PAST MEDICAL HISTORY: Past Medical History:  Diagnosis Date  . Allergy   . Anemia   . Dysmenorrhea   . Overweight   . Seborrhea     PAST SURGICAL HISTORY: Past Surgical History:  Procedure Laterality Date  . CESAREAN SECTION N/A 08/29/2015   Procedure: CESAREAN SECTION;  Surgeon: Vena AustriaAndreas Staebler, MD;  Location: [facility] ORS;  Service: Obstetrics;  Laterality: N/A;  . WISDOM TOOTH EXTRACTION      FAMILY  HISTORY: Family History  Problem Relation Age of Onset  . Heart failure Father 535  . Breast cancer Paternal Grandmother   . Diabetes Paternal Grandmother   . Hypertension Paternal Grandmother   . Colon cancer Paternal Grandfather   . Hypertension Paternal Grandfather     ADVANCED DIRECTIVES (Y/N):  N  HEALTH MAINTENANCE: Social History  Substance Use Topics  . Smoking status: Never Smoker  . Smokeless tobacco: Never Used  . Alcohol use No     Colonoscopy:  PAP:  Bone density:  Lipid panel:  No Known Allergies  Current Outpatient Prescriptions  Medication Sig Dispense Refill  . HYDROcodone-acetaminophen (NORCO/VICODIN) 5-325 MG tablet TK 1 T PO Q 6 H PRF SEVERE PAIN  0  . Prenatal Multivit-Min-Fe-FA (PRENATAL VITAMINS PO) Take 1 tablet by mouth every morning.      No current facility-administered medications for this visit.     OBJECTIVE: There were no vitals filed for this visit.   There is no height or weight on file to calculate BMI.    ECOG FS:0 - Asymptomatic  General: Well-developed, well-nourished, no acute distress. Eyes: Pink conjunctiva, anicteric sclera. HEENT: Normocephalic, moist mucous membranes, clear oropharnyx. Lungs: Clear to auscultation bilaterally. Heart: Regular rate and rhythm. No rubs, murmurs, or gallops. Abdomen: Appears appropriate for gestational age. Musculoskeletal: No edema, cyanosis, or clubbing. Neuro: Alert, answering all questions appropriately. Cranial nerves grossly intact. Skin: No rashes or petechiae noted. Psych: Normal affect. Lymphatics: No cervical, calvicular, axillary or inguinal LAD.   LAB RESULTS:  Lab Results  Component Value Date   NA 136 11/19/2016   K 3.4 (L) 11/19/2016  CL 105 11/19/2016   CO2 23 11/19/2016   GLUCOSE 81 11/19/2016   BUN 9 11/19/2016   CREATININE <0.30 (L) 11/19/2016   CALCIUM 8.7 (L) 11/19/2016   PROT 7.2 11/19/2016   ALBUMIN 3.2 (L) 11/19/2016   AST 21 11/19/2016   ALT 13 (L)  11/19/2016   ALKPHOS 100 11/19/2016   BILITOT 0.7 11/19/2016   GFRNONAA NOT CALCULATED 11/19/2016   GFRAA NOT CALCULATED 11/19/2016    Lab Results  Component Value Date   WBC 8.0 12/09/2016   NEUTROABS 7.2 (H) 11/19/2016   HGB 7.7 (L) 12/09/2016   HCT 24.7 (L) 12/09/2016   MCV 61.7 (L) 12/09/2016   PLT 274 12/09/2016     STUDIES: No results found.  ASSESSMENT: Anemia affecting pregnancy in the third trimester.  PLAN:    1. Anemia affecting pregnancy in the third trimester: Patient's hemoglobin has significantly declined and is 7.7 today. Her iron stores are pending, but she has a decreased MCV of 61.7. The remainder of her laboratory work from today is also pending at time of dictation. Patient will return to clinic later this week and again next week to receive 510 mg IV Feraheme. She will then return to clinic the last week of October just prior to her due date for repeat laboratory work, further evaluation, an additional IV iron if necessary. 2. Pregnancy: Patient reports she is a 35 weeks although does not have a scheduled C-section until January 28, 2017. Continue monitoring per OB/GYN. Follow-up as above.  Approximate 45 minutes was spent in discussion of which greater than 50% was consultation.  Patient expressed understanding and was in agreement with this plan. She also understands that She can call clinic at any time with any questions, concerns, or complaints.    Jeralyn Ruths, MD   01/17/2017 4:33 PM

## 2017-01-19 NOTE — Progress Notes (Signed)
ROB at 38wk2d. Some irregular contractions and more pelvic pressure Not much change in cervix since last week CS scheduled for 6 November. Desires TOLAC Labor precautions Discussed treatment for +GBS in labor ROB 1 week.

## 2017-01-20 ENCOUNTER — Inpatient Hospital Stay: Payer: Medicaid Other | Admitting: Oncology

## 2017-01-20 ENCOUNTER — Ambulatory Visit (INDEPENDENT_AMBULATORY_CARE_PROVIDER_SITE_OTHER): Payer: Medicaid Other | Admitting: Advanced Practice Midwife

## 2017-01-20 ENCOUNTER — Inpatient Hospital Stay: Payer: Medicaid Other

## 2017-01-20 VITALS — BP 114/66 | Wt 198.0 lb

## 2017-01-20 DIAGNOSIS — F1290 Cannabis use, unspecified, uncomplicated: Secondary | ICD-10-CM

## 2017-01-20 DIAGNOSIS — O0993 Supervision of high risk pregnancy, unspecified, third trimester: Secondary | ICD-10-CM

## 2017-01-20 NOTE — Progress Notes (Signed)
Routine Prenatal Care Visit  Subjective  Hailey Martin is a 21 y.o. G3P1011 at [redacted]w[redacted]d being seen today for ongoing prenatal care.  She is currently monitored for the following issues for this high-risk pregnancy and has Dysmenorrhea; History of cervicitis; Allergic rhinitis, seasonal; Seborrhea capitis; Iron deficiency anemia; Headache; Marijuana use; Previous cesarean section; Late prenatal care; Supervision of high-risk pregnancy; Anemia affecting pregnancy in third trimester; Abdominal pain during pregnancy, third trimester; and BV (bacterial vaginosis) on her problem list.  ----------------------------------------------------------------------------------- Patient reports no complaints.   Denies leaking of fluid. Denies vaginal bleeding. Occasional cramping. ----------------------------------------------------------------------------------- The following portions of the patient's history were reviewed and updated as appropriate: allergies, current medications, past family history, past medical history, past social history, past surgical history and problem list. Problem list updated.   Objective  Blood pressure 114/66, weight 198 lb (89.8 kg), last menstrual period 04/20/2016 Pregravid weight 155 lb (70.3 kg) Total Weight Gain 43 lb (19.5 kg) Urinalysis:      Fetal Status: positive fetal movement  General:  Alert, oriented and cooperative. Patient is in no acute distress.  Skin: Skin is warm and dry. No rash noted.   Cardiovascular: Normal heart rate noted  Respiratory: Normal respiratory effort, no problems with respiration noted  Abdomen: Soft, gravid, appropriate for gestational age.       Pelvic:  Cervical exam performed cervix is posterior, no sweep done.  Extremities: Normal range of motion.     Mental Status: Normal mood and affect. Normal behavior. Normal judgment and thought content.   Assessment   21 y.o. G3P1011 at [redacted]w[redacted]d by  01/25/2017, by Last Menstrual Period presenting  for routine prenatal visit  Plan   lmp  Problems (from 08/16/16 to present)    Problem Noted Resolved   Supervision of high-risk pregnancy 11/15/2016 by Farrel Conners, CNM No   Overview Addendum 11/17/2016  3:46 PM by Farrel Conners, CNM     Clinic [facility] then [facility] Prenatal Labs  Dating LMP=17 week ultrasound Blood type: A/Positive/-- (05/25 1453)   Genetic Screen 1 Screen:    AFP:     Quad:     NIPS: Antibody:Negative (05/25 1453)  Anatomic [place]  Rubella: 6.39 (05/25 1453) VI  GTT Early:               Third trimester: 76 RPR: Non Reactive (05/25 1453)   Flu vaccine  HBsAg: Negative (05/25 1453)   TDaP vaccine          11/14/2016                                     Rhogam: HIV:   negative  Baby Food       Bottle                                        GBS: (For PCN allergy, check sensitivities)  Contraception ?IUD Pap:  Circumcision  Anemia with H&H 7.8/26.7% at 40 weeks-hematology referral  Pediatrician    Support Person                  Term labor symptoms and general obstetric precautions including but not limited to vaginal bleeding, contractions, leaking of fluid and fetal movement were reviewed in detail with the patient.   No Follow-up on file.  Tresea Mall,  CNM  01/20/2017 10:41 AM

## 2017-01-20 NOTE — Progress Notes (Signed)
ROB C/S scheduled for 11/6

## 2017-01-22 LAB — URINE DRUG PANEL 7
Amphetamines, Urine: NEGATIVE ng/mL
Barbiturate Quant, Ur: NEGATIVE ng/mL
Benzodiazepine Quant, Ur: NEGATIVE ng/mL
Cannabinoid Quant, Ur: NEGATIVE ng/mL
Cocaine (Metab.): NEGATIVE ng/mL
Opiate Quant, Ur: NEGATIVE ng/mL
PCP Quant, Ur: NEGATIVE ng/mL

## 2017-01-24 ENCOUNTER — Ambulatory Visit (INDEPENDENT_AMBULATORY_CARE_PROVIDER_SITE_OTHER): Payer: Medicaid Other | Admitting: Obstetrics and Gynecology

## 2017-01-24 VITALS — BP 124/78 | Ht 66.0 in | Wt 196.0 lb

## 2017-01-24 DIAGNOSIS — O0930 Supervision of pregnancy with insufficient antenatal care, unspecified trimester: Secondary | ICD-10-CM

## 2017-01-24 DIAGNOSIS — F1290 Cannabis use, unspecified, uncomplicated: Secondary | ICD-10-CM

## 2017-01-24 DIAGNOSIS — Z3A39 39 weeks gestation of pregnancy: Secondary | ICD-10-CM

## 2017-01-24 DIAGNOSIS — O99013 Anemia complicating pregnancy, third trimester: Secondary | ICD-10-CM

## 2017-01-24 DIAGNOSIS — O0993 Supervision of high risk pregnancy, unspecified, third trimester: Secondary | ICD-10-CM

## 2017-01-24 DIAGNOSIS — Z98891 History of uterine scar from previous surgery: Secondary | ICD-10-CM

## 2017-01-24 NOTE — Progress Notes (Signed)
OB History & Physical   History of Present Illness:  Chief Complaint: Pre-op for Cesarean Section  HPI:  Hailey Martin is a 21 y.o. 583P1011 female at 7846w6d dated by LMP consistent with a 17 week ultrasound.  Her pregnancy has been complicated by history of cesarean delivery, late entry into care, severe anemia (hemoglobin 7.8 at NOB, s/p iron infusion x 2).  She denies contractions.   She denies leakage of fluid.   She denies vaginal bleeding.   She reports fetal movement.   S/P TDaP on 11/14/16.   Maternal Medical History:   Past Medical History:  Diagnosis Date  . Allergy   . Anemia   . Dysmenorrhea   . Overweight   . Seborrhea     Past Surgical History:  Procedure Laterality Date  . CESAREAN SECTION N/A 08/29/2015   Procedure: CESAREAN SECTION;  Surgeon: Vena AustriaAndreas Staebler, MD;  Location: [facility] ORS;  Service: Obstetrics;  Laterality: N/A;  . WISDOM TOOTH EXTRACTION      No Known Allergies  Prior to Admission medications   Medication Sig Start Date End Date Taking? Authorizing Provider  Prenatal Multivit-Min-Fe-FA (PRENATAL VITAMINS PO) Take 1 tablet by mouth every morning.     [provider]    OB History  Gravida Para Term Preterm AB Living  3 1 1  0 1 1  SAB TAB Ectopic Multiple Live Births  1 0 0 0 1    # Outcome Date GA Lbr Len/2nd Weight Sex Delivery Anes PTL Lv  3 Current           2 Term 08/29/15 8319w5d  6 lb 5.9 oz (2.89 kg) F  Gen  LIV     Complications: Other Excessive Bleeding,Abruptio Placenta  1 SAB             Obstetric Comments  LTCS for placental abruption    Prenatal care site: Begun at [facility], [facility]  Social History: She  reports that she has never smoked. She has never used smokeless tobacco. She reports that she does not drink alcohol or use drugs.  Family History: family history includes Breast cancer in her paternal grandmother; Colon cancer in her paternal grandfather; Diabetes in her  paternal grandmother; Heart failure (age of onset: 7735) in her father; Hypertension in her paternal grandfather and paternal grandmother.   Review of Systems: Negative x 10 systems reviewed except as noted in the HPI.    Physical Exam:  Vital Signs: BP 124/78   Ht 5\' 6"  (1.676 m)   Wt 196 lb (88.9 kg)   LMP 04/20/2016   BMI 31.64 kg/m  Constitutional: Well nourished, well developed female in no acute distress.  HEENT: normal Skin: Warm and dry.  Cardiovascular: Regular rate and rhythm.   Extremity: no edema  Respiratory: Clear to auscultation bilateral. Normal respiratory effort Abdomen: FHT present and gravid, NT Back: no CVAT Neuro: DTRs 2+, Cranial nerves grossly intact Psych: Alert and Oriented x3. No memory deficits. Normal mood and affect.  MS: normal gait, normal bilateral lower extremity ROM/strength/stability. FHR: 160 bpm  Pertinent Results:  Prenatal Labs: Blood type/Rh A positive  Antibody screen negative  Rubella Immune  Varicella Immune    RPR NR  HBsAg negative  HIV negative  GC negative  Chlamydia negative  Genetic screening Not done  1 hour GTT 76  3 hour GTT n/a  GBS positive on 12/30/16   Assessment:  Hailey Martin is  a 21 y.o. G27P1011 female at [redacted]w[redacted]d with pre-op visit for repeat cesarean section.   Plan:  Will admit for surgery on 01/28/17 Consents signed.   Thomasene Mohair, MD 01/24/2017 10:38 AM

## 2017-01-24 NOTE — H&P (View-Only) (Signed)
  OB History & Physical   History of Present Illness:  Chief Complaint: Pre-op for Cesarean Section  HPI:  Orrie R Puskarich is a 21 y.o. G3P1011 female at [redacted]w[redacted]d dated by LMP consistent with a 17 week ultrasound.  Her pregnancy has been complicated by history of cesarean delivery, late entry into care, severe anemia (hemoglobin 7.8 at NOB, s/p iron infusion x 2).  She denies contractions.   She denies leakage of fluid.   She denies vaginal bleeding.   She reports fetal movement.   S/P TDaP on 11/14/16.   Maternal Medical History:   Past Medical History:  Diagnosis Date  . Allergy   . Anemia   . Dysmenorrhea   . Overweight   . Seborrhea     Past Surgical History:  Procedure Laterality Date  . CESAREAN SECTION N/A 08/29/2015   Procedure: CESAREAN SECTION;  Surgeon: Andreas Staebler, MD;  Location: [facility] ORS;  Service: Obstetrics;  Laterality: N/A;  . WISDOM TOOTH EXTRACTION      No Known Allergies  Prior to Admission medications   Medication Sig Start Date End Date Taking? Authorizing Provider  Prenatal Multivit-Min-Fe-FA (PRENATAL VITAMINS PO) Take 1 tablet by mouth every morning.     [provider]    OB History  Gravida Para Term Preterm AB Living  3 1 1 0 1 1  SAB TAB Ectopic Multiple Live Births  1 0 0 0 1    # Outcome Date GA Lbr Len/2nd Weight Sex Delivery Anes PTL Lv  3 Current           2 Term 08/29/15 [redacted]w[redacted]d  6 lb 5.9 oz (2.89 kg) F  Gen  LIV     Complications: Other Excessive Bleeding,Abruptio Placenta  1 SAB             Obstetric Comments  LTCS for placental abruption    Prenatal care site: Begun at [facility], but transfer to [facility] at 29 weeks  Social History: She  reports that she has never smoked. She has never used smokeless tobacco. She reports that she does not drink alcohol or use drugs.  Family History: family history includes Breast cancer in her paternal grandmother; Colon cancer in her paternal grandfather; Diabetes in her  paternal grandmother; Heart failure (age of onset: 35) in her father; Hypertension in her paternal grandfather and paternal grandmother.   Review of Systems: Negative x 10 systems reviewed except as noted in the HPI.    Physical Exam:  Vital Signs: BP 124/78   Ht 5' 6" (1.676 m)   Wt 196 lb (88.9 kg)   LMP 04/20/2016   BMI 31.64 kg/m  Constitutional: Well nourished, well developed female in no acute distress.  HEENT: normal Skin: Warm and dry.  Cardiovascular: Regular rate and rhythm.   Extremity: no edema  Respiratory: Clear to auscultation bilateral. Normal respiratory effort Abdomen: FHT present and gravid, NT Back: no CVAT Neuro: DTRs 2+, Cranial nerves grossly intact Psych: Alert and Oriented x3. No memory deficits. Normal mood and affect.  MS: normal gait, normal bilateral lower extremity ROM/strength/stability. FHR: 160 bpm  Pertinent Results:  Prenatal Labs: Blood type/Rh A positive  Antibody screen negative  Rubella Immune  Varicella Immune    RPR NR  HBsAg negative  HIV negative  GC negative  Chlamydia negative  Genetic screening Not done  1 hour GTT 76  3 hour GTT n/a  GBS positive on 12/30/16   Assessment:  Tikita R Ates is   a 21 y.o. G3P1011 female at [redacted]w[redacted]d with pre-op visit for repeat cesarean section.   Plan:  Will admit for surgery on 01/28/17 Consents signed.   Jakobe Blau, MD 01/24/2017 10:38 AM    

## 2017-01-27 ENCOUNTER — Other Ambulatory Visit: Payer: Self-pay

## 2017-01-27 ENCOUNTER — Encounter
Admission: RE | Admit: 2017-01-27 | Discharge: 2017-01-27 | Disposition: A | Discharge status: Home / Self Care | Payer: Medicaid Other | Source: Ambulatory Visit | Attending: Obstetrics and Gynecology | Admitting: Obstetrics and Gynecology

## 2017-01-27 LAB — TYPE AND SCREEN
ABO/RH(D): A POS
Antibody Screen: NEGATIVE
Extend sample reason: UNDETERMINED

## 2017-01-27 LAB — CBC
HCT: 33.8 % — ABNORMAL LOW (ref 35.0–47.0)
Hemoglobin: 11 g/dL — ABNORMAL LOW (ref 12.0–16.0)
MCH: 25.6 pg — ABNORMAL LOW (ref 26.0–34.0)
MCHC: 32.5 g/dL (ref 32.0–36.0)
MCV: 78.8 fL — ABNORMAL LOW (ref 80.0–100.0)
Platelets: 196 10*3/uL (ref 150–440)
RBC: 4.28 MIL/uL (ref 3.80–5.20)
RDW: 31 % — ABNORMAL HIGH (ref 11.5–14.5)
WBC: 8.5 10*3/uL (ref 3.6–11.0)

## 2017-01-27 LAB — RAPID HIV SCREEN (HIV 1/2 AB+AG)
HIV 1/2 Antibodies: NONREACTIVE
HIV-1 P24 Antigen - HIV24: NONREACTIVE

## 2017-01-27 MED ORDER — CEFAZOLIN SODIUM-DEXTROSE 2-4 GM/100ML-% IV SOLN
2.0000 g | INTRAVENOUS | Status: DC
  Filled 2017-01-27: qty 100

## 2017-01-27 NOTE — Patient Instructions (Signed)
Your procedure is scheduled on: 01/28/2017 Tuesday Report to Emergency Department To find out your arrival time please call [phone] between 1PM - 3PM on Be her at Emergency Dept  At 5:30 am.  Remember: Instructions that are not followed completely may result in serious medical risk, up to and including death, or upon the discretion of your surgeon and anesthesiologist your surgery may need to be rescheduled.     _X__ 1. Do not eat food after midnight the night before your procedure.                 No gum chewing or hard candies. You may drink clear liquids up to 2 hours                 before you are scheduled to arrive for your surgery- DO not drink clear                 liquids within 2 hours of the start of your surgery.                 Clear Liquids include:  water, apple juice without pulp, clear carbohydrate                 drink such as Clearfast of Gartorade, Black Coffee or Tea (Do not add                 anything to coffee or tea).     _X__ 2.  No Alcohol for 24 hours before or after surgery.   _X__ 3.  Do Not Smoke or use e-cigarettes For 24 Hours Prior to Your Surgery.                 Do not use any chewable tobacco products for at least 6 hours prior to                 surgery.  ____  4.  Bring all medications with you on the day of surgery if instructed.   ____  5.  Notify your doctor if there is any change in your medical condition      (cold, fever, infections).     Do not wear jewelry, make-up, hairpins, clips or nail polish. Do not wear lotions, powders, or perfumes. Do not shave 48 hours prior to surgery. Men may shave face and neck. Do not bring valuables to the hospital.    [facility] is not responsible for any belongings or valuables.  Contacts, dentures or bridgework may not be worn into surgery. Leave your suitcase in the car. After surgery it may be brought to your room. For patients admitted to the hospital, discharge time is  determined by your treatment team.   Patients discharged the day of surgery will not be allowed to drive home.   Please read over the following fact sheets that you were given:            ____ Take these medicines the morning of surgery with A SIP OF WATER:    1.  2.   3.   4.  5.  6.  ____ Fleet Enema (as directed)   __X__ Use CHG Soap as directed  WIPES given  ____ Use inhalers on the day of surgery  ____ Stop metformin 2 days prior to surgery    ____ Take 1/2 of usual insulin dose the night before surgery. No insulin the morning  of surgery.   ____ Stop Coumadin/Plavix/aspirin on  ( NO ASPIRIN OR ASPIRIN PRODUCTS)  ____ Stop Anti-inflammatories on ( ALEVE, GOODY POWDER, ASPIRIN )   ___x_ Stop supplements until after surgery.    ____ Bring C-Pap to the hospital.

## 2017-01-28 ENCOUNTER — Inpatient Hospital Stay: Payer: Medicaid Other | Admitting: Anesthesiology

## 2017-01-28 ENCOUNTER — Encounter
Admission: RE | Disposition: A | Discharge status: Home / Self Care | Payer: Self-pay | Source: Ambulatory Visit | Attending: Obstetrics and Gynecology

## 2017-01-28 ENCOUNTER — Inpatient Hospital Stay
Admission: RE | Admit: 2017-01-28 | Discharge: 2017-01-30 | DRG: 787 | Disposition: A | Discharge status: Home / Self Care | Payer: Medicaid Other | Source: Ambulatory Visit | Attending: Obstetrics and Gynecology | Admitting: Obstetrics and Gynecology

## 2017-01-28 DIAGNOSIS — O99013 Anemia complicating pregnancy, third trimester: Secondary | ICD-10-CM | POA: Diagnosis present

## 2017-01-28 DIAGNOSIS — O34211 Maternal care for low transverse scar from previous cesarean delivery: Principal | ICD-10-CM | POA: Diagnosis present

## 2017-01-28 DIAGNOSIS — Z3A39 39 weeks gestation of pregnancy: Secondary | ICD-10-CM

## 2017-01-28 DIAGNOSIS — O0993 Supervision of high risk pregnancy, unspecified, third trimester: Secondary | ICD-10-CM

## 2017-01-28 DIAGNOSIS — Z98891 History of uterine scar from previous surgery: Secondary | ICD-10-CM

## 2017-01-28 DIAGNOSIS — O9081 Anemia of the puerperium: Secondary | ICD-10-CM | POA: Diagnosis not present

## 2017-01-28 DIAGNOSIS — D62 Acute posthemorrhagic anemia: Secondary | ICD-10-CM | POA: Diagnosis not present

## 2017-01-28 DIAGNOSIS — F1290 Cannabis use, unspecified, uncomplicated: Secondary | ICD-10-CM | POA: Diagnosis present

## 2017-01-28 DIAGNOSIS — D509 Iron deficiency anemia, unspecified: Secondary | ICD-10-CM | POA: Diagnosis present

## 2017-01-28 DIAGNOSIS — O0990 Supervision of high risk pregnancy, unspecified, unspecified trimester: Secondary | ICD-10-CM

## 2017-01-28 DIAGNOSIS — O0930 Supervision of pregnancy with insufficient antenatal care, unspecified trimester: Secondary | ICD-10-CM

## 2017-01-28 LAB — RPR: RPR Ser Ql: NONREACTIVE

## 2017-01-28 SURGERY — Surgical Case
Anesthesia: Spinal | Site: Abdomen | Wound class: Clean Contaminated

## 2017-01-28 MED ORDER — BUPIVACAINE HCL (PF) 0.5 % IJ SOLN
5.0000 mL | Freq: Once | INTRAMUSCULAR | Status: DC
  Filled 2017-01-28: qty 30

## 2017-01-28 MED ORDER — WITCH HAZEL-GLYCERIN EX PADS: 1.0000 "application " | MEDICATED_PAD | CUTANEOUS | Status: DC | PRN

## 2017-01-28 MED ORDER — ACETAMINOPHEN 325 MG PO TABS
ORAL_TABLET | ORAL | Status: AC
  Filled 2017-01-28: qty 2

## 2017-01-28 MED ORDER — MENTHOL 3 MG MT LOZG
1.0000 | LOZENGE | OROMUCOSAL | Status: DC | PRN
  Filled 2017-01-28: qty 9

## 2017-01-28 MED ORDER — OXYTOCIN 40 UNITS IN LACTATED RINGERS INFUSION - SIMPLE MED
INTRAVENOUS | Status: DC | PRN
  Administered 2017-01-28: 400 mL via INTRAVENOUS

## 2017-01-28 MED ORDER — MORPHINE SULFATE (PF) 0.5 MG/ML IJ SOLN
INTRAMUSCULAR | Status: DC | PRN
  Administered 2017-01-28: .1 mg via INTRATHECAL

## 2017-01-28 MED ORDER — DEXTROSE 5 % IV SOLN: 2.0000 g | INTRAVENOUS | Status: DC

## 2017-01-28 MED ORDER — NALBUPHINE HCL 10 MG/ML IJ SOLN: 5.0000 mg | Freq: Once | INTRAMUSCULAR | Status: DC | PRN

## 2017-01-28 MED ORDER — NALBUPHINE HCL 10 MG/ML IJ SOLN: 5.0000 mg | INTRAMUSCULAR | Status: DC | PRN

## 2017-01-28 MED ORDER — BUPIVACAINE HCL (PF) 0.5 % IJ SOLN: 5.0000 mL | Freq: Once | INTRAMUSCULAR | Status: DC

## 2017-01-28 MED ORDER — NALOXONE HCL 0.4 MG/ML IJ SOLN: 0.4000 mg | INTRAMUSCULAR | Status: DC | PRN

## 2017-01-28 MED ORDER — OXYCODONE-ACETAMINOPHEN 5-325 MG PO TABS
2.0000 | ORAL_TABLET | ORAL | Status: DC | PRN
  Administered 2017-01-30 (×2): 2 via ORAL
  Filled 2017-01-28 (×2): qty 2

## 2017-01-28 MED ORDER — SOD CITRATE-CITRIC ACID 500-334 MG/5ML PO SOLN
30.0000 mL | ORAL | Status: AC
  Administered 2017-01-28: 30 mL via ORAL
  Filled 2017-01-28: qty 15

## 2017-01-28 MED ORDER — LACTATED RINGERS IV SOLN: Freq: Once | INTRAVENOUS | Status: DC

## 2017-01-28 MED ORDER — MEPERIDINE HCL 25 MG/ML IJ SOLN: 6.2500 mg | INTRAMUSCULAR | Status: DC | PRN

## 2017-01-28 MED ORDER — SODIUM CHLORIDE 0.9% FLUSH: 3.0000 mL | INTRAVENOUS | Status: DC | PRN

## 2017-01-28 MED ORDER — PHENYLEPHRINE HCL 10 MG/ML IJ SOLN
INTRAMUSCULAR | Status: DC | PRN
  Administered 2017-01-28: 100 ug via INTRAVENOUS

## 2017-01-28 MED ORDER — OXYTOCIN 10 UNIT/ML IJ SOLN
INTRAMUSCULAR | Status: AC
  Filled 2017-01-28: qty 8

## 2017-01-28 MED ORDER — COCONUT OIL OIL: 1.0000 "application " | TOPICAL_OIL | Status: DC | PRN

## 2017-01-28 MED ORDER — MORPHINE SULFATE (PF) 0.5 MG/ML IJ SOLN
INTRAMUSCULAR | Status: AC
  Filled 2017-01-28: qty 10

## 2017-01-28 MED ORDER — SENNOSIDES-DOCUSATE SODIUM 8.6-50 MG PO TABS
2.0000 | ORAL_TABLET | ORAL | Status: DC
  Administered 2017-01-28 – 2017-01-29 (×2): 2 via ORAL
  Filled 2017-01-28 (×2): qty 2

## 2017-01-28 MED ORDER — ONDANSETRON HCL 4 MG/2ML IJ SOLN: 4.0000 mg | Freq: Three times a day (TID) | INTRAMUSCULAR | Status: DC | PRN

## 2017-01-28 MED ORDER — LACTATED RINGERS IV SOLN
INTRAVENOUS | Status: DC
  Administered 2017-01-28: 06:00:00 via INTRAVENOUS

## 2017-01-28 MED ORDER — OXYCODONE HCL 5 MG PO TABS
5.0000 mg | ORAL_TABLET | ORAL | Status: DC | PRN
  Administered 2017-01-28: 5 mg via ORAL
  Filled 2017-01-28: qty 1

## 2017-01-28 MED ORDER — OXYTOCIN 40 UNITS IN LACTATED RINGERS INFUSION - SIMPLE MED
2.5000 [IU]/h | INTRAVENOUS | Status: DC
  Administered 2017-01-28: 2.5 [IU]/h via INTRAVENOUS
  Filled 2017-01-28 (×2): qty 1000

## 2017-01-28 MED ORDER — PRENATAL MULTIVITAMIN CH
1.0000 | ORAL_TABLET | ORAL | Status: DC
  Administered 2017-01-29 – 2017-01-30 (×2): 1 via ORAL
  Filled 2017-01-28 (×4): qty 1

## 2017-01-28 MED ORDER — IBUPROFEN 600 MG PO TABS
600.0000 mg | ORAL_TABLET | Freq: Four times a day (QID) | ORAL | Status: DC
  Administered 2017-01-29 – 2017-01-30 (×5): 600 mg via ORAL
  Filled 2017-01-28 (×5): qty 1

## 2017-01-28 MED ORDER — LACTATED RINGERS IV SOLN
INTRAVENOUS | Status: DC | PRN
  Administered 2017-01-28: 08:00:00 via INTRAVENOUS

## 2017-01-28 MED ORDER — DIPHENHYDRAMINE HCL 25 MG PO CAPS
25.0000 mg | ORAL_CAPSULE | Freq: Four times a day (QID) | ORAL | Status: DC | PRN
  Filled 2017-01-28: qty 1

## 2017-01-28 MED ORDER — BUPIVACAINE 0.25 % ON-Q PUMP DUAL CATH 400 ML
400.0000 mL | INJECTION | Status: DC
  Filled 2017-01-28: qty 400

## 2017-01-28 MED ORDER — ACETAMINOPHEN 325 MG PO TABS
650.0000 mg | ORAL_TABLET | Freq: Four times a day (QID) | ORAL | Status: AC
  Administered 2017-01-28 – 2017-01-29 (×4): 650 mg via ORAL
  Filled 2017-01-28 (×3): qty 2

## 2017-01-28 MED ORDER — OXYCODONE-ACETAMINOPHEN 5-325 MG PO TABS
1.0000 | ORAL_TABLET | ORAL | Status: DC | PRN
  Administered 2017-01-29: 1 via ORAL
  Filled 2017-01-28: qty 1

## 2017-01-28 MED ORDER — DIPHENHYDRAMINE HCL 25 MG PO CAPS
25.0000 mg | ORAL_CAPSULE | ORAL | Status: DC | PRN
  Administered 2017-01-28 (×2): 25 mg via ORAL
  Filled 2017-01-28 (×2): qty 1

## 2017-01-28 MED ORDER — SODIUM CHLORIDE 0.9 % IV SOLN
INTRAVENOUS | Status: DC | PRN
  Administered 2017-01-28: 50 ug/min via INTRAVENOUS

## 2017-01-28 MED ORDER — CEFAZOLIN SODIUM 10 G IJ SOLR
2.0000 g | INTRAMUSCULAR | Status: AC
Start: 2017-01-28 — End: 2017-01-28
  Administered 2017-01-28: 2 g via INTRAVENOUS
  Filled 2017-01-28: qty 2000

## 2017-01-28 MED ORDER — SIMETHICONE 80 MG PO CHEW
80.0000 mg | CHEWABLE_TABLET | Freq: Three times a day (TID) | ORAL | Status: DC
  Administered 2017-01-28 – 2017-01-30 (×5): 80 mg via ORAL
  Filled 2017-01-28 (×5): qty 1

## 2017-01-28 MED ORDER — FENTANYL CITRATE (PF) 100 MCG/2ML IJ SOLN
INTRAMUSCULAR | Status: AC
  Filled 2017-01-28: qty 2

## 2017-01-28 MED ORDER — OXYCODONE HCL 5 MG PO TABS: 10.0000 mg | ORAL_TABLET | ORAL | Status: DC | PRN

## 2017-01-28 MED ORDER — BUPIVACAINE IN DEXTROSE 0.75-8.25 % IT SOLN
INTRATHECAL | Status: DC | PRN
  Administered 2017-01-28: 1.8 mL via INTRATHECAL

## 2017-01-28 MED ORDER — KETOROLAC TROMETHAMINE 30 MG/ML IJ SOLN
INTRAMUSCULAR | Status: AC
  Administered 2017-01-28: 30 mg
  Filled 2017-01-28: qty 1

## 2017-01-28 MED ORDER — FENTANYL CITRATE (PF) 100 MCG/2ML IJ SOLN
INTRAMUSCULAR | Status: DC | PRN
  Administered 2017-01-28: 15 ug via INTRATHECAL

## 2017-01-28 MED ORDER — BUPIVACAINE HCL 0.5 % IJ SOLN
INTRAMUSCULAR | Status: DC | PRN
  Administered 2017-01-28: 10 mL

## 2017-01-28 MED ORDER — DIBUCAINE 1 % RE OINT: 1.0000 "application " | TOPICAL_OINTMENT | RECTAL | Status: DC | PRN

## 2017-01-28 MED ORDER — DIPHENHYDRAMINE HCL 50 MG/ML IJ SOLN
12.5000 mg | INTRAMUSCULAR | Status: DC | PRN
  Administered 2017-01-28: 12.5 mg via INTRAVENOUS
  Filled 2017-01-28: qty 1

## 2017-01-28 MED ORDER — KETOROLAC TROMETHAMINE 30 MG/ML IJ SOLN
30.0000 mg | Freq: Four times a day (QID) | INTRAMUSCULAR | Status: DC
  Administered 2017-01-28 – 2017-01-29 (×3): 30 mg via INTRAVENOUS
  Filled 2017-01-28 (×3): qty 1

## 2017-01-28 MED ORDER — LACTATED RINGERS IV SOLN: INTRAVENOUS | Status: DC

## 2017-01-28 MED ORDER — INFLUENZA VAC SPLIT QUAD 0.5 ML IM SUSY: 0.5000 mL | PREFILLED_SYRINGE | INTRAMUSCULAR | Status: DC

## 2017-01-28 MED ORDER — KETOROLAC TROMETHAMINE 30 MG/ML IJ SOLN: 30.0000 mg | Freq: Four times a day (QID) | INTRAMUSCULAR | Status: DC

## 2017-01-28 MED ORDER — FERROUS SULFATE 325 (65 FE) MG PO TABS
325.0000 mg | ORAL_TABLET | Freq: Two times a day (BID) | ORAL | Status: DC
  Administered 2017-01-29 – 2017-01-30 (×3): 325 mg via ORAL
  Filled 2017-01-28 (×3): qty 1

## 2017-01-28 SURGICAL SUPPLY — 33 items
BENZOIN TINCTURE PRP APPL 2/3 (GAUZE/BANDAGES/DRESSINGS) ×3 IMPLANT
CANISTER SUCT 3000ML PPV (MISCELLANEOUS) IMPLANT
CATH KIT ON-Q SILVERSOAK 5IN (CATHETERS) ×6 IMPLANT
CLOSURE WOUND 1/2 X4 (GAUZE/BANDAGES/DRESSINGS) ×1
DERMABOND ADVANCED (GAUZE/BANDAGES/DRESSINGS) ×2
DERMABOND ADVANCED .7 DNX12 (GAUZE/BANDAGES/DRESSINGS) ×1 IMPLANT
DRSG OPSITE POSTOP 4X10 (GAUZE/BANDAGES/DRESSINGS) IMPLANT
DRSG TELFA 3X8 NADH (GAUZE/BANDAGES/DRESSINGS) ×3 IMPLANT
ELECT CAUTERY BLADE 6.4 (BLADE) ×3 IMPLANT
ELECT REM PT RETURN 9FT ADLT (ELECTROSURGICAL) ×3
ELECTRODE REM PT RTRN 9FT ADLT (ELECTROSURGICAL) ×1 IMPLANT
GAUZE SPONGE 4X4 12PLY STRL (GAUZE/BANDAGES/DRESSINGS) ×3 IMPLANT
GLOVE BIO SURGEON STRL SZ7 (GLOVE) ×12 IMPLANT
GLOVE INDICATOR 7.5 STRL GRN (GLOVE) ×12 IMPLANT
GOWN STRL REUS W/ TWL LRG LVL3 (GOWN DISPOSABLE) ×4 IMPLANT
GOWN STRL REUS W/TWL LRG LVL3 (GOWN DISPOSABLE) ×8
NS IRRIG 1000ML POUR BTL (IV SOLUTION) ×3 IMPLANT
PACK C SECTION AR (MISCELLANEOUS) ×3 IMPLANT
PAD ABD DERMACEA PRESS 5X9 (GAUZE/BANDAGES/DRESSINGS) ×3 IMPLANT
PAD OB MATERNITY 4.3X12.25 (PERSONAL CARE ITEMS) ×6 IMPLANT
PAD PREP 24X41 OB/GYN DISP (PERSONAL CARE ITEMS) ×3 IMPLANT
SPONGE LAP 18X18 5 PK (GAUZE/BANDAGES/DRESSINGS) IMPLANT
STRIP CLOSURE SKIN 1/2X4 (GAUZE/BANDAGES/DRESSINGS) ×2 IMPLANT
SUT CHROMIC GUT BROWN 0 54 (SUTURE) IMPLANT
SUT CHROMIC GUT BROWN 0 54IN (SUTURE)
SUT MNCRL 4-0 (SUTURE) ×2
SUT MNCRL 4-0 27XMFL (SUTURE) ×1
SUT PDS AB 1 TP1 96 (SUTURE) ×3 IMPLANT
SUT PLAIN GUT 0 (SUTURE) IMPLANT
SUT VIC AB 0 CTX 36 (SUTURE) ×6
SUT VIC AB 0 CTX36XBRD ANBCTRL (SUTURE) ×3 IMPLANT
SUTURE MNCRL 4-0 27XMF (SUTURE) ×1 IMPLANT
SWABSTK COMLB BENZOIN TINCTURE (MISCELLANEOUS) ×3 IMPLANT

## 2017-01-28 NOTE — Anesthesia Procedure Notes (Signed)
Spinal  Patient location during procedure: OR Start time: 01/28/2017 8:14 AM End time: 01/28/2017 8:21 AM Staffing Anesthesiologist: Emmie Niemann, MD Resident/CRNA: Jonna Clark, CRNA Performed: resident/CRNA  Preanesthetic Checklist Completed: patient identified, site marked, surgical consent, pre-op evaluation, timeout performed, IV checked, risks and benefits discussed and monitors and equipment checked Spinal Block Patient position: sitting Prep: ChloraPrep Patient monitoring: heart rate, continuous pulse ox, blood pressure and cardiac monitor Approach: midline Location: L4-5 Injection technique: single-shot Needle Needle type: Introducer and Pencil-Tip  Needle gauge: 24 G Needle length: 9 cm Additional Notes Negative paresthesia. Negative blood return. Positive free-flowing CSF. Expiration date of kit checked and confirmed. Patient tolerated procedure well, without complications.

## 2017-01-28 NOTE — Transfer of Care (Signed)
Immediate Anesthesia Transfer of Care Note  Patient: Hailey Martin  Procedure(s) Performed: CESAREAN SECTION (N/A Abdomen)  Patient Location: PACU and Mother/Baby  Anesthesia Type:Spinal  Level of Consciousness: awake, alert  and oriented  Airway & Oxygen Therapy: Patient Spontanous Breathing  Post-op Assessment: Report given to RN and Post -op Vital signs reviewed and stable  Post vital signs: Reviewed and stable  Last Vitals:  Vitals:   01/28/17 0601 01/28/17 0942  BP: 113/71 112/76  Pulse: 84 76  Resp: 18 16  Temp: 36.8 C   SpO2:  100%    Last Pain:  Vitals:   01/28/17 0629  TempSrc:   PainSc: 0-No pain         Complications: No apparent anesthesia complications

## 2017-01-28 NOTE — Interval H&P Note (Signed)
History and Physical Interval Note:  01/28/2017 8:22 AM  Wing R Barrell  has presented today for surgery, with the diagnosis of REPEAT CESAREAN AT TERM  The various methods of treatment have been discussed with the patient and family. After consideration of risks, benefits and other options for treatment, the patient has consented to  Procedure(s): CESAREAN SECTION (N/A) as a surgical intervention .  The patient's history has been reviewed, patient examined, no change in status, stable for surgery.  I have reviewed the patient's chart and labs.  Questions were answered to the patient's satisfaction.    Thomasene MohairStephen Drenda Sobecki, MD 01/28/2017 8:23 AM

## 2017-01-28 NOTE — Anesthesia Post-op Follow-up Note (Signed)
Anesthesia QCDR form completed.        

## 2017-01-28 NOTE — Discharge Summary (Signed)
OB Discharge Summary     Patient Name: Hailey Martin DOB: 12/25/1995 MRN: [id number]  Date of admission: 01/28/2017 Delivering MD: Thomasene MohairStephen Jackson, MD  Date of Delivery: 01/28/2017  Date of discharge: 01/30/2017  Admitting diagnosis: REPEAT CESAREAN AT TERM Intrauterine pregnancy: 6456w3d     Secondary diagnosis: None     Discharge diagnosis: Term Pregnancy Delivered                                                                                                Post partum procedures: None  Augmentation: n/a  Complications: None  Hospital course:  Scheduled C/S   21 y.o. yo G3P1011 at 2156w3d was admitted to the hospital 01/28/2017 for scheduled cesarean section with the following indication: Elective Repeat. Patient delivered a Viable infant on 01/28/2017. Details of operation can be found in separate operative note.  Hailey Martin had an uncomplicated postpartum course.  She is ambulating, tolerating a regular diet, passing flatus, and urinating well. Patient is discharged home in stable condition on  01/30/17.         Physical exam  Vitals:   01/29/17 2300 01/30/17 0413 01/30/17 0419 01/30/17 0810  BP:   106/66 114/68  Pulse:   72 85  Resp:   20 18  Temp: 98.4 F (36.9 C) 98 F (36.7 C) 97.6 F (36.4 C) 97.7 F (36.5 C)  TempSrc: Oral Oral Oral Oral  SpO2:    99%  Weight:      Height:       General: cooperative and no distress Lochia: appropriate Uterine Fundus: firm, U/2, lochia rubra scant Incision: Dressing is clean, dry, and intact DVT Evaluation: No evidence of DVT seen on physical exam. No significant calf/ankle edema.  Labs: Lab Results  Component Value Date   WBC 11.0 01/29/2017   HGB 9.9 (L) 01/29/2017   HCT 31.0 (L) 01/29/2017   MCV 79.9 (L) 01/29/2017   PLT 172 01/29/2017    Discharge instruction: per After Visit Summary.  Medications:  Allergies as of 01/30/2017   No Known Allergies     Medication List    TAKE these medications   ferrous sulfate 325  (65 FE) MG tablet Take 1 tablet (325 mg total) 2 (two) times daily with a meal by mouth.   oxyCODONE-acetaminophen 5-325 MG tablet Commonly known as:  PERCOCET/ROXICET Take 1 tablet every 4 (four) hours as needed by mouth for moderate pain or severe pain.   PRENATAL VITAMINS PO Take 1 tablet by mouth every morning.            Discharge Care Instructions  (From admission, onward)        Start     Ordered   01/30/17 0000  Discharge wound care:    Comments:  You may apply a light dressing for minor discharge from the incision or to keep waistbands of clothing from rubbing.  You may also have been discharge with a clear dressing in which case this will be removed at your postoperative clinic visit.  You may shower, use soap on your incision.  Avoiding baths or soaking the incision  in the first 6 weeks following your surgery.[name]   01/30/17 0942      Diet: routine diet  Activity: Advance as tolerated. Pelvic rest for 6 weeks.   Outpatient follow up: Follow-up Information    Conard Novak, MD Follow up in 1 week(s).   Specialty:  Obstetrics and Gynecology Why:  post op incision check Contact information: [address] [place] [address] [phone]             Postpartum contraception: IUD Rhogam Given postpartum: no Rubella vaccine given postpartum: no Varicella vaccine given postpartum: no TDaP given antepartum or postpartum: AP, 11/14/16  Newborn Data: Live born female  Birth Weight: 7 lb 4.1 oz (3290 g) APGAR: 9, 9  Newborn Delivery   Birth date/time:  01/28/2017 08:41:00 Delivery type:  C-Section, Low Transverse      Baby Feeding: Bottle  Disposition: home with mother  SIGNED: Marcelyn Bruins, CNM 01/30/2017  10:48 AM

## 2017-01-28 NOTE — Op Note (Signed)
Cesarean Section Operative Note    Hailey Martin   01/28/2017   Pre-operative Diagnosis:  1) intrauterine pregnancy at 7534w3d  2) history of cesarean delivery, desires repeat  Post-operative Diagnosis:  1) intrauterine pregnancy at 4734w3d  2) history of cesarean delivery, desires repeat  Procedure: Repeat low transverse cesarean section via Pfannenstiel incision with double layer uterine closure  Surgeon: Surgeon(s) and Role:    * Conard NovakJackson, Jama Mcmiller D, MD - Primary    Vena Austria* Staebler, Andreas, MD - Assisting   Anesthesia: spinal   Findings:  1) normal appearing gravid uterus, fallopian tubes, and ovaries 2) viable female infant with weight of 3,290 grams and APGARs of 9 and 9   Estimated Blood Loss: 800 mL  Total IV Fluids: 600 ml   Urine Output: 200 mL  Specimens: none  Complications: no complications  Disposition: PACU - hemodynamically stable.   Maternal Condition: stable   Baby condition / location:  Couplet care / Skin to Skin  Procedure Details:  The patient was seen in the Holding Room. The risks, benefits, complications, treatment options, and expected outcomes were discussed with the patient. The patient concurred with the proposed plan, giving informed consent. identified as Hailey Martin and the procedure verified as C-Section Delivery. A Time Out was held and the above information confirmed.   After induction of anesthesia, the patient was prepped and draped in the usual sterile manner. A Pfannenstiel incision was made and carried down through the subcutaneous tissue to the fascia. Fascial incision was made and extended transversely. The fascia was separated from the underlying rectus tissue superiorly and inferiorly. The peritoneum was identified and entered. Peritoneal incision was extended longitudinally. The bladder flap was not freed from the lower uterine segment. A low transverse uterine incision was made and the hysterotomy was extended with cranial-caudal  tension. Delivered from cephalic presentation was a 3,290 gram Living newborn infant(s) or Female with Apgar scores of 9 at one minute and 9 at five minutes. Cord ph was not sent the umbilical cord was clamped and cut cord blood was not obtained for evaluation. The placenta was removed Intact and appeared normal. The uterine outline, tubes and ovaries appeared normal. The uterine incision was closed with running locked sutures of 0 Vicryl.  A second layer of the same suture was thrown in an imbricating fashion.  An additional figure-of-eight stitch was thrown with 0 Vicryl and hemostasis was assured.  The uterus was returned to the abdomen and the paracolic gutters were cleared of all clots and debris.  The rectus muscles were inspected and found to be hemostatic.  The On-Q catheter pumps were inserted in accordance with the manufacturer's recommendations.  The catheters were inserted approximately 4cm cephelad to the incision line, approximately 1cm apart, straddling the midline.  They were inserted to a depth of the 4th mark. They were positioned superficial to the rectus abdominus muscles and deep to the rectus fascia.    The fascia was then reapproximated with running sutures of 1-0 PDS, looped. The subcuticular closure was performed using 4-0 monocryl. The skin closure was reinforced using benzoin and 1/2" steri-strips.  The On-Q catheters were bolused with 5 mL of 0.5% marcaine plain for a total of 10 mL.  The catheters were affixed to the skin with surgical skin glue, steri-strips, and tegaderm.    Instrument, sponge, and needle counts were correct prior the abdominal closure and were correct at the conclusion of the case.  The patient received Ancef 2  gram IV prior to skin incision (within 30 minutes). For VTE prophylaxis she was wearing SCDs throughout the case.  Signed: Conard NovakStephen D. Judine Arciniega, MD 01/28/2017 9:30 AM

## 2017-01-28 NOTE — Anesthesia Preprocedure Evaluation (Signed)
Anesthesia Evaluation  Patient identified by MRN, date of birth, ID band Patient awake    Reviewed: Allergy & Precautions, NPO status , Patient's Chart, lab work & pertinent test results  History of Anesthesia Complications Negative for: history of anesthetic complications  Airway Mallampati: II  TM Distance: >3 FB Neck ROM: Full    Dental no notable dental hx.    Pulmonary neg pulmonary ROS, neg sleep apnea, neg COPD,    breath sounds clear to auscultation- rhonchi (-) wheezing      Cardiovascular Exercise Tolerance: Good (-) hypertension(-) CAD and (-) Past MI  Rhythm:Regular Rate:Normal - Systolic murmurs and - Diastolic murmurs    Neuro/Psych  Headaches, negative psych ROS   GI/Hepatic negative GI ROS, Neg liver ROS,   Endo/Other  negative endocrine ROSneg diabetes  Renal/GU negative Renal ROS     Musculoskeletal negative musculoskeletal ROS (+)   Abdominal (+) - obese, Gravid abdomen  Peds  Hematology  (+) anemia ,   Anesthesia Other Findings Hx of 1 prior csection done under epidural anesthesia   Reproductive/Obstetrics (+) Pregnancy                             Lab Results  Component Value Date   WBC 8.5 01/27/2017   HGB 11.0 (L) 01/27/2017   HCT 33.8 (L) 01/27/2017   MCV 78.8 (L) 01/27/2017   PLT 196 01/27/2017    Anesthesia Physical Anesthesia Plan  ASA: II  Anesthesia Plan: Spinal   Post-op Pain Management:    Induction:   PONV Risk Score and Plan: 2 and Ondansetron and Treatment may vary due to age or medical condition  Airway Management Planned: Natural Airway  Additional Equipment:   Intra-op Plan:   Post-operative Plan:   Informed Consent: I have reviewed the patients History and Physical, chart, labs and discussed the procedure including the risks, benefits and alternatives for the proposed anesthesia with the patient or authorized representative who  has indicated his/her understanding and acceptance.   Dental advisory given  Plan Discussed with: CRNA and Anesthesiologist  Anesthesia Plan Comments:         Anesthesia Quick Evaluation

## 2017-01-29 ENCOUNTER — Encounter: Payer: Self-pay | Admitting: Obstetrics and Gynecology

## 2017-01-29 LAB — CBC
HCT: 31 % — ABNORMAL LOW (ref 35.0–47.0)
Hemoglobin: 9.9 g/dL — ABNORMAL LOW (ref 12.0–16.0)
MCH: 25.6 pg — ABNORMAL LOW (ref 26.0–34.0)
MCHC: 32 g/dL (ref 32.0–36.0)
MCV: 79.9 fL — ABNORMAL LOW (ref 80.0–100.0)
Platelets: 172 10*3/uL (ref 150–440)
RBC: 3.89 MIL/uL (ref 3.80–5.20)
RDW: 30.8 % — ABNORMAL HIGH (ref 11.5–14.5)
WBC: 11 10*3/uL (ref 3.6–11.0)

## 2017-01-29 LAB — CHLAMYDIA/NGC RT PCR (ARMC ONLY)
Chlamydia Tr: NOT DETECTED
N gonorrhoeae: NOT DETECTED

## 2017-01-29 NOTE — Progress Notes (Signed)
Patient ID: Hailey Martin, female   DOB: 06/23/1995, 21 y.o.   MRN: [id number]  Admit Date: 01/28/2017 Today's Date: 01/29/2017  Subjective: Postpartum Day 1: Cesarean Delivery Patient reports tolerating PO, + flatus and no problems voiding.  Bottle feeding.  Objective: Vital signs in last 24 hours: Temp:  [98 F (36.7 C)-98.6 F (37 C)] 98.3 F (36.8 C) (11/07 0831) Pulse Rate:  [67-90] 74 (11/07 0831) Resp:  [14-19] 18 (11/07 0831) BP: (107-129)/(54-75) 116/72 (11/07 0831) SpO2:  [99 %-100 %] 100 % (11/07 0831)   Blood type: A+ Rubella Immune Varicella Immune  Physical Exam:  General: alert, cooperative, appears stated age and no distress Lochia: appropriate Uterine Fundus: firm Incision: healing well, no significant drainage, no dehiscence, no significant erythema, clean dry, intact. DVT Evaluation: No evidence of DVT seen on physical exam. No cords or calf tenderness. No significant calf/ankle edema.  Recent Labs    01/27/17 1150 01/29/17 0717  HGB 11.0* 9.9*  HCT 33.8* 31.0*    Assessment/Plan: 21yo [id number] status post Cesarean section. Doing well postoperatively.  Continue current care.  Hailey Martin R Meliyah Simon 01/29/2017, 10:54 AM

## 2017-01-29 NOTE — Anesthesia Post-op Follow-up Note (Signed)
  Anesthesia Pain Follow-up Note  Patient: Hailey Martin  Day #: 1  Date of Follow-up: 01/29/2017 Time: 7:28 AM  Last Vitals:  Vitals:   01/28/17 2330 01/29/17 0413  BP: (!) 107/54 (!) 113/57  Pulse: 85 79  Resp: 18 18  Temp: 36.9 C 36.7 C  SpO2: 100% 100%    Level of Consciousness: alert  Pain: mild   Side Effects:None  Catheter Site Exam:clean, dry     Plan: D/C from anesthesia care at surgeon's request  [name], Admir Candelas D

## 2017-01-29 NOTE — Anesthesia Postprocedure Evaluation (Signed)
Anesthesia Post Note  Patient: Hailey Martin  Procedure(s) Performed: CESAREAN SECTION (N/A Abdomen)  Patient location during evaluation: Mother Baby Anesthesia Type: Spinal Level of consciousness: awake and alert and oriented Pain management: pain level controlled Vital Signs Assessment: post-procedure vital signs reviewed and stable Respiratory status: respiratory function stable Cardiovascular status: stable Postop Assessment: no headache, no backache, spinal receding, patient able to bend at knees, no apparent nausea or vomiting and adequate PO intake Anesthetic complications: no     Last Vitals:  Vitals:   01/28/17 2330 01/29/17 0413  BP: (!) 107/54 (!) 113/57  Pulse: 85 79  Resp: 18 18  Temp: 36.9 C 36.7 C  SpO2: 100% 100%    Last Pain:  Vitals:   01/29/17 0558  TempSrc:   PainSc: 3                  [name], Shomari Matusik D

## 2017-01-30 MED ORDER — FERROUS SULFATE 325 (65 FE) MG PO TABS: 325.0000 mg | ORAL_TABLET | Freq: Two times a day (BID) | ORAL | 3 refills | Status: DC

## 2017-01-30 MED ORDER — OXYCODONE-ACETAMINOPHEN 5-325 MG PO TABS: 1.0000 | ORAL_TABLET | ORAL | 0 refills | Status: DC | PRN

## 2017-01-30 NOTE — Progress Notes (Addendum)
POD#2 LTCS Subjective:  Patient appears fatigued, no acute distress. Expresses desire for discharge today. Ambulating well. Voiding without difficulty. Tolerating a regular diet and passing flatus. Pain control is adequate with PRN medication.  Objective:  Blood pressure 114/68, pulse 85, temperature 97.7 F (36.5 C), temperature source Oral, resp. rate 18, height 5\' 6"  (1.676 m), weight 196 lb (88.9 kg), last menstrual period 04/20/2016, SpO2 99 %, not currently breastfeeding.  General: NAD Pulmonary: no increased work of breathing Abdomen: non-distended, non-tender, fundus firm at U/2, lochia rubra scant Incision: dressing C/D/I and On-Q pump in place  Extremities: no edema, no erythema, no tenderness  Results for orders placed or performed during the hospital encounter of 01/28/17 (from the past 72 hour(s))  CBC     Status: Abnormal   Collection Time: 01/29/17  7:17 AM  Result Value Ref Range   WBC 11.0 3.6 - 11.0 K/uL   RBC 3.89 3.80 - 5.20 MIL/uL   Hemoglobin 9.9 (L) 12.0 - 16.0 g/dL   HCT 57.831.0 (L) 46.935.0 - 62.947.0 %   MCV 79.9 (L) 80.0 - 100.0 fL   MCH 25.6 (L) 26.0 - 34.0 pg   MCHC 32.0 32.0 - 36.0 g/dL   RDW 52.830.8 (H) 41.311.5 - 24.414.5 %   Platelets 172 150 - 440 K/uL  Chlamydia/NGC rt PCR ([facility] only)     Status: None   Collection Time: 01/29/17  8:09 AM  Result Value Ref Range   Specimen source GC/Chlam URINE, RANDOM    Chlamydia Tr NOT DETECTED NOT DETECTED   N gonorrhoeae NOT DETECTED NOT DETECTED    Comment: (NOTE) 100  This methodology has not been evaluated in pregnant women or in 200  patients with a history of hysterectomy. 300 400  This methodology will not be performed on patients less than 2414  years of age.      Assessment:   21 y.o. G3P1011 postoperative day # 2, in stable condition.   Plan:  1) Acute blood loss anemia - hemodynamically stable and asymptomatic - PO ferrous sulfate  2) Blood Type --/--/A POS (11/05 1150) / Rubella 6.39 (05/25 1453) /  Varicella Immune  3) TDAP status: received 11/14/16  4) Formula feeding  5) Contraception: desires IUD  6) Disposition: discharge home today.  Marcelyn BruinsJacelyn Schmid, CNM 01/30/2017  9:52 AM

## 2017-01-30 NOTE — Progress Notes (Signed)
Discharge order received from doctor. Flu vaccine offered prior to discharge. Patient refused flu vaccine. Reviewed discharge instructions and prescriptions with patient and answered all questions. Incision cleaning kit given. On-Q pump removal instructions given. Follow up appointment given. Patient verbalized understanding. ID bands checked. Patient discharged home with infant via wheelchair by nursing/auxillary.    Hilbert Bible, RN

## 2017-01-30 NOTE — Discharge Instructions (Signed)
Please call your doctor or return to the ER if you experience any chest pains, shortness of breath, dizziness, visual changes, fever greater than 101, any heavy bleeding (saturating more than 1 pad per hour), large clots, or foul smelling discharge, any worsening abdominal pain and cramping that is not controlled by pain medication, or any signs of postpartum depression. No tampons, enemas, douches, or sexual intercourse for 6 weeks. Also avoid tub baths, hot tubs, or swimming for 6 weeks.   Check your incision daily for any signs of infection such as redness, warmth, swelling, increased pain, or pus/foul smelling discharge   Activity: do not lift over 10 lbs for 6 weeks  No driving for 1-2 weeks  Pelvic rest for 6 weeks

## 2017-02-05 ENCOUNTER — Ambulatory Visit: Payer: Medicaid Other | Admitting: Obstetrics & Gynecology

## 2017-02-10 ENCOUNTER — Telehealth: Payer: Self-pay | Admitting: Obstetrics and Gynecology

## 2017-02-10 ENCOUNTER — Encounter: Payer: Self-pay | Admitting: Obstetrics and Gynecology

## 2017-02-10 ENCOUNTER — Ambulatory Visit (INDEPENDENT_AMBULATORY_CARE_PROVIDER_SITE_OTHER): Payer: Medicaid Other | Admitting: Obstetrics and Gynecology

## 2017-02-10 VITALS — BP 122/78 | Ht 66.0 in | Wt 178.0 lb

## 2017-02-10 DIAGNOSIS — Z98891 History of uterine scar from previous surgery: Secondary | ICD-10-CM

## 2017-02-10 DIAGNOSIS — Z09 Encounter for follow-up examination after completed treatment for conditions other than malignant neoplasm: Secondary | ICD-10-CM

## 2017-02-10 DIAGNOSIS — Z9889 Other specified postprocedural states: Secondary | ICD-10-CM

## 2017-02-10 NOTE — Progress Notes (Signed)
   Postoperative Follow-up Patient presents post op from cesarean section  2weeks ago.  Subjective: She denies fever, chills, nausea and vomiting. Eating a regular diet without difficulty. The patient is not having any pain.  Activity: normal activities of daily living. She denies issues with her incision.    Objective: BP 122/78   Ht 5\' 6"  (1.676 m)   Wt 178 lb (80.7 kg)   BMI 28.73 kg/m   Constitutional: Well nourished, well developed female in no acute distress.  HEENT: normal Skin: Warm and dry.  Abdomen: Soft, non-tender, normal bowel sounds; no bruits, organomegaly or masses. clean, dry, intact and no erythema, induration, warmth, and tenderness Extremity: no edema   Assessment: 21 y.o. s/p cesarean section progressing well  Plan: Patient has done well after surgery with no apparent complications.  I have discussed the post-operative course to date, and the expected progress moving forward.  The patient understands what complications to be concerned about.    Activity plan: increase lifting slowly. No greater than 20-30 pounds until 6 week pospartum.   Return in about 4 weeks (around 03/10/2017) for 6 week postpartum with Mirean IUD placement.  Thomasene MohairStephen Donte Kary, MD 02/10/2017 12:13 PM

## 2017-02-10 NOTE — Telephone Encounter (Signed)
12/17 at 10:30 for mirena insert with sdj

## 2017-03-10 ENCOUNTER — Ambulatory Visit: Payer: Medicaid Other | Admitting: Obstetrics and Gynecology

## 2017-05-20 NOTE — Telephone Encounter (Signed)
Pt cancelled apt

## 2018-01-22 ENCOUNTER — Encounter: Payer: Self-pay | Admitting: Emergency Medicine

## 2018-01-22 ENCOUNTER — Emergency Department
Admission: EM | Admit: 2018-01-22 | Discharge: 2018-01-22 | Disposition: A | Discharge status: Home / Self Care | Payer: Self-pay | Attending: Student in an Organized Health Care Education/Training Program | Admitting: Student in an Organized Health Care Education/Training Program

## 2018-01-22 ENCOUNTER — Other Ambulatory Visit: Payer: Self-pay

## 2018-01-22 ENCOUNTER — Emergency Department: Payer: Self-pay

## 2018-01-22 DIAGNOSIS — Z3A10 10 weeks gestation of pregnancy: Secondary | ICD-10-CM | POA: Insufficient documentation

## 2018-01-22 DIAGNOSIS — R102 Pelvic and perineal pain: Secondary | ICD-10-CM | POA: Insufficient documentation

## 2018-01-22 DIAGNOSIS — O209 Hemorrhage in early pregnancy, unspecified: Secondary | ICD-10-CM | POA: Insufficient documentation

## 2018-01-22 DIAGNOSIS — O4690 Antepartum hemorrhage, unspecified, unspecified trimester: Secondary | ICD-10-CM

## 2018-01-22 DIAGNOSIS — O99011 Anemia complicating pregnancy, first trimester: Secondary | ICD-10-CM | POA: Insufficient documentation

## 2018-01-22 DIAGNOSIS — F1290 Cannabis use, unspecified, uncomplicated: Secondary | ICD-10-CM | POA: Insufficient documentation

## 2018-01-22 DIAGNOSIS — Z79899 Other long term (current) drug therapy: Secondary | ICD-10-CM | POA: Insufficient documentation

## 2018-01-22 DIAGNOSIS — N939 Abnormal uterine and vaginal bleeding, unspecified: Secondary | ICD-10-CM

## 2018-01-22 DIAGNOSIS — O26891 Other specified pregnancy related conditions, first trimester: Secondary | ICD-10-CM | POA: Insufficient documentation

## 2018-01-22 DIAGNOSIS — D509 Iron deficiency anemia, unspecified: Secondary | ICD-10-CM

## 2018-01-22 DIAGNOSIS — O99321 Drug use complicating pregnancy, first trimester: Secondary | ICD-10-CM | POA: Insufficient documentation

## 2018-01-22 LAB — CBC WITH DIFFERENTIAL/PLATELET
Abs Immature Granulocytes: 0.02 10*3/uL (ref 0.00–0.07)
Basophils Absolute: 0 10*3/uL (ref 0.0–0.1)
Basophils Relative: 1 %
Eosinophils Absolute: 0 10*3/uL (ref 0.0–0.5)
Eosinophils Relative: 0 %
HCT: 23.2 % — ABNORMAL LOW (ref 36.0–46.0)
Hemoglobin: 6 g/dL — ABNORMAL LOW (ref 12.0–15.0)
Immature Granulocytes: 0 %
Lymphocytes Relative: 21 %
Lymphs Abs: 1.3 10*3/uL (ref 0.7–4.0)
MCH: 14.8 pg — ABNORMAL LOW (ref 26.0–34.0)
MCHC: 25.9 g/dL — ABNORMAL LOW (ref 30.0–36.0)
MCV: 57.3 fL — ABNORMAL LOW (ref 80.0–100.0)
Monocytes Absolute: 0.6 10*3/uL (ref 0.1–1.0)
Monocytes Relative: 9 %
Neutro Abs: 4.1 10*3/uL (ref 1.7–7.7)
Neutrophils Relative %: 69 %
Platelets: 482 10*3/uL — ABNORMAL HIGH (ref 150–400)
RBC: 4.05 MIL/uL (ref 3.87–5.11)
RDW: 21.4 % — ABNORMAL HIGH (ref 11.5–15.5)
Smear Review: NORMAL
WBC: 6 10*3/uL (ref 4.0–10.5)
nRBC: 0 % (ref 0.0–0.2)

## 2018-01-22 LAB — IRON AND TIBC
Iron: 8 ug/dL — ABNORMAL LOW (ref 28–170)
Saturation Ratios: 2 % — ABNORMAL LOW (ref 10.4–31.8)
TIBC: 420 ug/dL (ref 250–450)
UIBC: 412 ug/dL

## 2018-01-22 LAB — CHLAMYDIA/NGC RT PCR (ARMC ONLY)
Chlamydia Tr: NOT DETECTED
N gonorrhoeae: NOT DETECTED

## 2018-01-22 LAB — RETICULOCYTES
Immature Retic Fract: 13.6 % (ref 2.3–15.9)
RBC.: 3.89 MIL/uL (ref 3.87–5.11)
Retic Count, Absolute: 48.6 K/uL (ref 19.0–186.0)
Retic Ct Pct: 1.3 % (ref 0.4–3.1)

## 2018-01-22 LAB — TYPE AND SCREEN
ABO/RH(D): A POS
Antibody Screen: NEGATIVE

## 2018-01-22 LAB — WET PREP, GENITAL
Clue Cells Wet Prep HPF POC: NONE SEEN
Sperm: NONE SEEN
Trich, Wet Prep: NONE SEEN
Yeast Wet Prep HPF POC: NONE SEEN

## 2018-01-22 LAB — HCG, QUANTITATIVE, PREGNANCY: hCG, Beta Chain, Quant, S: 79721 m[IU]/mL — ABNORMAL HIGH (ref <5)

## 2018-01-22 LAB — FERRITIN: Ferritin: 2 ng/mL — ABNORMAL LOW (ref 11–307)

## 2018-01-22 MED ORDER — IRON 325 (65 FE) MG PO TABS: 1.0000 | ORAL_TABLET | ORAL | 1 refills | Status: DC

## 2018-01-22 MED ORDER — SODIUM CHLORIDE 0.9 % IV SOLN: 25.0000 mg | Freq: Once | INTRAVENOUS | Status: DC

## 2018-01-22 MED ORDER — SODIUM CHLORIDE 0.9 % IV SOLN
500.0000 mg | Freq: Once | INTRAVENOUS | Status: DC
  Filled 2018-01-22: qty 10

## 2018-01-22 MED ORDER — SODIUM CHLORIDE 0.9 % IV SOLN
25.0000 mg | Freq: Once | INTRAVENOUS | Status: DC
  Filled 2018-01-22: qty 0.5

## 2018-01-22 MED ORDER — SODIUM CHLORIDE 0.9 % IV SOLN: 100.0000 mg | Freq: Once | INTRAVENOUS | Status: DC

## 2018-01-22 MED ORDER — CLOTRIMAZOLE 1 % VA CREA: 1.0000 | TOPICAL_CREAM | Freq: Every day | VAGINAL | 0 refills | Status: AC

## 2018-01-22 MED ORDER — FERROUS SULFATE 325 (65 FE) MG PO TABS
325.0000 mg | ORAL_TABLET | Freq: Every day | ORAL | Status: DC
  Administered 2018-01-22: 325 mg via ORAL
  Filled 2018-01-22: qty 1

## 2018-01-22 MED ORDER — PRENATAL VITAMINS 0.8 MG PO TABS: 1.0000 | ORAL_TABLET | ORAL | 1 refills | Status: DC

## 2018-01-22 NOTE — ED Provider Notes (Signed)
[facility] ([place] Emergency Department Provider Note    First MD Initiated Contact with Patient 01/22/18 [phone]     (approximate)  I have reviewed the triage vital signs and the nursing notes.   HISTORY  Chief Complaint Vaginal Bleeding    HPI Hailey Martin is a 22 y.o. female G32P1011 who states that she is roughly 6 to [redacted] weeks pregnant.  Complaining of vaginal bleeding and intermittent crampy pelvic pain.  Denies any pain right now.  Is also noted discharge.  She is been having intermittent spotting for the past several days but noticing getting worse.  Has not followed up with PCP or OB/GYN.  Denies any history of bleeding disorders.     Past Medical History:  Diagnosis Date  . Allergy   . Anemia   . Dysmenorrhea   . Overweight   . Seborrhea    Family History  Problem Relation Age of Onset  . Heart failure Father 48  . Breast cancer Paternal Grandmother   . Diabetes Paternal Grandmother   . Hypertension Paternal Grandmother   . Colon cancer Paternal Grandfather   . Hypertension Paternal Grandfather    Past Surgical History:  Procedure Laterality Date  . CESAREAN SECTION N/A 08/29/2015   Procedure: CESAREAN SECTION;  Surgeon: Vena Austria, MD;  Location: [facility] ORS;  Service: Obstetrics;  Laterality: N/A;  . CESAREAN SECTION N/A 01/28/2017   Procedure: CESAREAN SECTION;  Surgeon: Conard Novak, MD;  Location: [facility] ORS;  Service: Obstetrics;  Laterality: N/A;  . WISDOM TOOTH EXTRACTION     Patient Active Problem List   Diagnosis Date Noted  . Status post cesarean delivery 01/28/2017  . BV (bacterial vaginosis) 12/02/2016  . Abdominal pain during pregnancy, third trimester 11/19/2016  . Anemia affecting pregnancy in third trimester 11/17/2016  . Supervision of high-risk pregnancy 11/15/2016  . Previous cesarean section 09/12/2016  . Late prenatal care 09/12/2016  . Marijuana use 08/23/2016  . Headache 07/06/2015  . Iron deficiency anemia  11/08/2014  . Dysmenorrhea 09/07/2014  . Allergic rhinitis, seasonal 09/07/2014  . 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 History of cervicitis 11/26/2012      Prior to Admission medications   Medication Sig Start Date End Date Taking? Authorizing Provider  Ferrous Sulfate (IRON) 325 (65 Fe) MG TABS Take 1 tablet (325 mg total) by mouth every other day. Take with food 01/22/18   Willy Eddy, MD  ferrous sulfate 325 (65 FE) MG tablet Take 1 tablet (325 mg total) 2 (two) times daily with a meal by mouth. 01/30/17   Oswaldo Conroy, CNM  oxyCODONE-acetaminophen (PERCOCET/ROXICET) 5-325 MG tablet Take 1 tablet every 4 (four) hours as needed by mouth for moderate pain or severe pain. 01/30/17   Oswaldo Conroy, CNM  Prenatal Multivit-Min-Fe-FA (PRENATAL VITAMINS) 0.8 MG tablet Take 1 tablet by mouth every morning. 01/22/18   Willy Eddy, MD    Allergies Patient has no known allergies.    Social History Social History   Tobacco Use  . Smoking status: Never Smoker  . Smokeless tobacco: Never Used  Substance Use Topics  . Alcohol use: No    Alcohol/week: 0.0 standard drinks  . Drug use: No    Review of Systems Patient denies headaches, rhinorrhea, blurry vision, numbness, shortness of breath, chest pain, edema, cough, abdominal pain, nausea, vomiting, diarrhea, dysuria, fevers, rashes or hallucinations unless otherwise stated above in HPI. ____________________________________________   PHYSICAL EXAM:  VITAL SIGNS: Vitals:   01/22/18 0724 01/22/18  0930  BP: 124/68 112/61  Pulse: 87 76  Resp: 20 18  Temp: 98.2 F (36.8 C)   SpO2: 100% 100%    Constitutional: Alert and oriented.  Eyes: Conjunctivae are normal.  Head: Atraumatic. Nose: No congestion/rhinnorhea. Mouth/Throat: Mucous membranes are moist.   Neck: No stridor. Painless ROM.  Cardiovascular: Normal rate, regular rhythm. Grossly normal heart sounds.  Good peripheral circulation. Respiratory: Normal  respiratory effort.  No retractions. Lungs CTAB. Gastrointestinal: Soft and nontender. No distention. No abdominal bruits. No CVA tenderness. Genitourinary: normal external genitalia, closed cervix, white discharge c/w yeast Musculoskeletal: No lower extremity tenderness nor edema.  No joint effusions. Neurologic:  Normal speech and language. No gross focal neurologic deficits are appreciated. No facial droop Skin:  Skin is warm, dry and intact. No rash noted. Psychiatric: Mood and affect are normal. Speech and behavior are normal.  ____________________________________________   LABS (all labs ordered are listed, but only abnormal results are displayed)  Results for orders placed or performed during the hospital encounter of 01/22/18 (from the past 24 hour(s))  hCG, quantitative, pregnancy     Status: Abnormal   Collection Time: 01/22/18  7:48 AM  Result Value Ref Range   hCG, Beta Chain, Quant, S 79,721 (H) <5 mIU/mL  CBC with Differential/Platelet     Status: Abnormal   Collection Time: 01/22/18  7:48 AM  Result Value Ref Range   WBC 6.0 4.0 - 10.5 K/uL   RBC 4.05 3.87 - 5.11 MIL/uL   Hemoglobin 6.0 (L) 12.0 - 15.0 g/dL   HCT 09.8 (L) 11.9 - 14.7 %   MCV 57.3 (L) 80.0 - 100.0 fL   MCH 14.8 (L) 26.0 - 34.0 pg   MCHC 25.9 (L) 30.0 - 36.0 g/dL   RDW 82.9 (H) 56.2 - 13.0 %   Platelets 482 (H) 150 - 400 K/uL   nRBC 0.0 0.0 - 0.2 %   Neutrophils Relative % 69 %   Neutro Abs 4.1 1.7 - 7.7 K/uL   Lymphocytes Relative 21 %   Lymphs Abs 1.3 0.7 - 4.0 K/uL   Monocytes Relative 9 %   Monocytes Absolute 0.6 0.1 - 1.0 K/uL   Eosinophils Relative 0 %   Eosinophils Absolute 0.0 0.0 - 0.5 K/uL   Basophils Relative 1 %   Basophils Absolute 0.0 0.0 - 0.1 K/uL   WBC Morphology MORPHOLOGY UNREMARKABLE    Smear Review Normal platelet morphology    Immature Granulocytes 0 %   Abs Immature Granulocytes 0.02 0.00 - 0.07 K/uL   Polychromasia PRESENT   Iron and TIBC     Status: Abnormal    Collection Time: 01/22/18  7:48 AM  Result Value Ref Range   Iron 8 (L) 28 - 170 ug/dL   TIBC 865 784 - 696 ug/dL   Saturation Ratios 2 (L) 10.4 - 31.8 %   UIBC 412 ug/dL  Ferritin (Iron Binding Protein)     Status: Abnormal   Collection Time: 01/22/18  7:48 AM  Result Value Ref Range   Ferritin 2 (L) 11 - 307 ng/mL  Reticulocytes     Status: None   Collection Time: 01/22/18  7:48 AM  Result Value Ref Range   Retic Ct Pct 1.3 0.4 - 3.1 %   RBC. 3.89 3.87 - 5.11 MIL/uL   Retic Count, Absolute 48.6 19.0 - 186.0 K/uL   Immature Retic Fract 13.6 2.3 - 15.9 %  Wet prep, genital     Status: Abnormal   Collection  Time: 01/22/18  8:08 AM  Result Value Ref Range   Yeast Wet Prep HPF POC NONE SEEN NONE SEEN   Trich, Wet Prep NONE SEEN NONE SEEN   Clue Cells Wet Prep HPF POC NONE SEEN NONE SEEN   WBC, Wet Prep HPF POC MODERATE (A) NONE SEEN   Sperm NONE SEEN   Chlamydia/NGC rt PCR ([facility] only)     Status: None   Collection Time: 01/22/18  8:08 AM  Result Value Ref Range   Specimen source GC/Chlam ENDOCERVICAL    Chlamydia Tr NOT DETECTED NOT DETECTED   N gonorrhoeae NOT DETECTED NOT DETECTED  Type and screen [facility]     Status: None   Collection Time: 01/22/18  9:10 AM  Result Value Ref Range   ABO/RH(D) A POS    Antibody Screen NEG    Sample Expiration      01/25/2018 Performed at [facility], [address] [place], [place] [address]    ____________________________________________  ____________________________________________  RADIOLOGY  I personally reviewed all radiographic images ordered to evaluate for the above acute complaints and reviewed radiology reports and findings.  These findings were personally discussed with the patient.  Please see medical record for radiology report.  ____________________________________________   PROCEDURES  Procedure(s) performed:  Procedures    Critical Care performed:  no ____________________________________________   INITIAL IMPRESSION / ASSESSMENT AND PLAN / ED COURSE  Pertinent labs & imaging results that were available during my care of the patient were reviewed by me and considered in my medical decision making (see chart for details).   DDX: candidiasis, bv, trich, ectopic, aub, threatened ab  Hailey Martin is a 22 y.o. who presents to the ED with was as described above.  Physical exam is reassuring.  We will send swabs for wet prep and GC, CZ.  Given her report of vaginal bleeding in early pregnancy and pelvic pain will order ultrasound to evaluate for ectopic.  Patient is Rh+.  Clinical Course as of Jan 22 1034  Thu Jan 22, 2018  6045 Discussed concerning results of CBC with the patient.  States that she is got along history of anemia.  Denies any chest pain shortness of breath.  No lightheadedness or syncopal events.  Recommended transfusion as an option patient is declined this but does agree to trial of oral iron supplementation.   [PR]  1029 Patient remains asymptomatic.  Does have significant iron deficiency anemia which she has had in the past and feels comfortable managing this as an outpatient.  Patient will be given referral to hematology as well as OB/GYN discussed signs and symptoms for which she should return immediately to the hospital.   [PR]    Clinical Course User Index [PR] Willy Eddy, MD     As part of my medical decision making, I reviewed the following data within the electronic MEDICAL RECORD NUMBER Nursing notes reviewed and incorporated, Labs reviewed, notes from prior ED visits and [place] Controlled Substance Database   ____________________________________________   FINAL CLINICAL IMPRESSION(S) / ED DIAGNOSES  Final diagnoses:  Vaginal bleeding  Vaginal bleeding in pregnancy  Iron deficiency anemia, unspecified iron deficiency anemia type  [redacted] weeks gestation of pregnancy      NEW MEDICATIONS STARTED DURING THIS  VISIT:  New Prescriptions   FERROUS SULFATE (IRON) 325 (65 FE) MG TABS    Take 1 tablet (325 mg total) by mouth every other day. Take with food     Note:  This document was prepared using Dragon voice recognition software and may include unintentional dictation errors.    Willy Eddy, MD 01/22/18 [phone]

## 2018-01-22 NOTE — ED Triage Notes (Signed)
Patient to ER for c/o vaginal bleeding and vaginal discharge. Patient reports she is approx [redacted] weeks pregnant.

## 2018-03-24 LAB — HM HIV SCREENING LAB: HM HIV Screening: NEGATIVE

## 2018-06-04 ENCOUNTER — Encounter: Payer: Self-pay | Admitting: Advanced Practice Midwife

## 2018-06-05 ENCOUNTER — Ambulatory Visit (INDEPENDENT_AMBULATORY_CARE_PROVIDER_SITE_OTHER): Payer: Self-pay | Admitting: Maternal Newborn

## 2018-06-05 ENCOUNTER — Other Ambulatory Visit (HOSPITAL_COMMUNITY)
Admission: RE | Admit: 2018-06-05 | Discharge: 2018-06-05 | Disposition: A | Discharge status: Home / Self Care | Payer: Medicaid Other | Source: Ambulatory Visit | Attending: Maternal Newborn | Admitting: Maternal Newborn

## 2018-06-05 ENCOUNTER — Other Ambulatory Visit: Payer: Self-pay

## 2018-06-05 ENCOUNTER — Encounter: Payer: Medicaid Other | Admitting: Maternal Newborn

## 2018-06-05 ENCOUNTER — Encounter: Payer: Self-pay | Admitting: Maternal Newborn

## 2018-06-05 VITALS — BP 124/66 | Wt 184.0 lb

## 2018-06-05 DIAGNOSIS — O26899 Other specified pregnancy related conditions, unspecified trimester: Secondary | ICD-10-CM

## 2018-06-05 DIAGNOSIS — Z113 Encounter for screening for infections with a predominantly sexual mode of transmission: Secondary | ICD-10-CM | POA: Insufficient documentation

## 2018-06-05 DIAGNOSIS — Z3689 Encounter for other specified antenatal screening: Secondary | ICD-10-CM

## 2018-06-05 DIAGNOSIS — Z1379 Encounter for other screening for genetic and chromosomal anomalies: Secondary | ICD-10-CM

## 2018-06-05 DIAGNOSIS — N898 Other specified noninflammatory disorders of vagina: Secondary | ICD-10-CM

## 2018-06-05 DIAGNOSIS — O0990 Supervision of high risk pregnancy, unspecified, unspecified trimester: Secondary | ICD-10-CM | POA: Insufficient documentation

## 2018-06-05 DIAGNOSIS — Z3A28 28 weeks gestation of pregnancy: Secondary | ICD-10-CM

## 2018-06-05 DIAGNOSIS — O26893 Other specified pregnancy related conditions, third trimester: Secondary | ICD-10-CM

## 2018-06-05 LAB — POCT URINALYSIS DIPSTICK OB: Glucose, UA: NEGATIVE

## 2018-06-05 LAB — OB RESULTS CONSOLE VARICELLA ZOSTER ANTIBODY, IGG: Varicella: IMMUNE

## 2018-06-05 NOTE — Progress Notes (Signed)
06/05/2018   Chief Complaint: Desires prenatal care.  Transfer of Care Patient: No  History of Present Illness: Ms. Binion is a 23 y.o. G3P2002 at [redacted]w[redacted]d based on Ultrasound, with an Estimated Date of Delivery: 08/18/2018, with the above CC.   Her periods were: regular periods every month She has Negative signs or symptoms of nausea/vomiting of pregnancy. She has Negative signs or symptoms of miscarriage or preterm labor, had some spotting in early pregnancy around  weeks gestation which resolved She identifies Negative Zika risk factors for her and her partner On any different medications around the time she conceived/early pregnancy: No  History of varicella: Yes    Review of Systems  Constitutional: Negative.   HENT: Negative.   Eyes: Negative.   Respiratory: Negative for shortness of breath and wheezing.   Cardiovascular: Negative for chest pain and palpitations.  Gastrointestinal: Negative for abdominal pain, nausea and vomiting.  Genitourinary: Negative.   Musculoskeletal: Negative.   Skin: Negative.   Neurological: Positive for headaches.  Endo/Heme/Allergies: Negative.   Psychiatric/Behavioral: Negative for depression. The patient is not nervous/anxious.     ROS: Review of systems was otherwise negative, except as stated in the above HPI.  OBGYN History: As per HPI. OB History  Gravida Para Term Preterm AB Living  3 2 2  0 0 2  SAB TAB Ectopic Multiple Live Births  0 0 0 0 2    # Outcome Date GA Lbr Len/2nd Weight Sex Delivery Anes PTL Lv  3 Current           2 Term 01/28/17 [redacted]w[redacted]d   F CS-LTranv  N LIV  1 Term 08/29/15 [redacted]w[redacted]d  6 lb 5.9 oz (2.89 kg) F  Gen  LIV     Complications: Other Excessive Bleeding, Abruptio Placenta    Obstetric Comments  LTCS for placental abruption    Any issues with any prior pregnancies: Yes, placental abruption with G1. Has had two Cesarean deliveries, second was elective. Any prior children are healthy, doing well, without any problems  or issues: Yes History of pap smears: Yes. Last pap smear 08/16/2016. NILM. History of STIs: No   Past Medical History: Past Medical History:  Diagnosis Date  . Allergy   . Anemia   . Dysmenorrhea   . Overweight   . Seborrhea     Past Surgical History: Past Surgical History:  Procedure Laterality Date  . CESAREAN SECTION N/A 08/29/2015   Procedure: CESAREAN SECTION;  Surgeon: Vena Austria, MD;  Location: [facility] ORS;  Service: Obstetrics;  Laterality: N/A;  . CESAREAN SECTION N/A 01/28/2017   Procedure: CESAREAN SECTION;  Surgeon: Conard Novak, MD;  Location: [facility] ORS;  Service: Obstetrics;  Laterality: N/A;  . WISDOM TOOTH EXTRACTION      Family History:  Family History  Problem Relation Age of Onset  . Heart failure Father 45  . Breast cancer Paternal Grandmother   . Diabetes Paternal Grandmother   . Hypertension Paternal Grandmother   . Colon cancer Paternal Grandfather   . Hypertension Paternal Grandfather    She denies any female cancers, bleeding or blood clotting disorders.  She denies any history of intellectual disability, birth defects or genetic disorders in her or the FOB's history  Social History:  Social History   Socioeconomic History  . Marital status: Single    Spouse name: Not on file  . Number of children: 1  . Years of education: 32  . Highest education level: Not on file  Occupational History  .  Occupation: Deli     Employer: [employer]  Social Needs  . Financial resource strain: Not on file  . Food insecurity:    Worry: Not on file    Inability: Not on file  . Transportation needs:    Medical: Not on file    Non-medical: Not on file  Tobacco Use  . Smoking status: Never Smoker  . Smokeless tobacco: Never Used  Substance and Sexual Activity  . Alcohol use: No    Alcohol/week: 0.0 standard drinks  . Drug use: No  . Sexual activity: Yes    Partners: Male  Lifestyle  . Physical activity:    Days per week: Not on file     Minutes per session: Not on file  . Stress: Not on file  Relationships  . Social connections:    Talks on phone: Not on file    Gets together: Not on file    Attends religious service: Not on file    Active member of club or organization: Not on file    Attends meetings of clubs or organizations: Not on file    Relationship status: Not on file  . Intimate partner violence:    Fear of current or ex partner: Not on file    Emotionally abused: Not on file    Physically abused: Not on file    Forced sexual activity: Not on file  Other Topics Concern  . Not on file  Social History Narrative  . Not on file   Any cats in the household: no Domestic violence screening is negative.  Allergy: No Known Allergies  Current Outpatient Medications: No current outpatient medications on file.   Physical Exam:   BP 124/66   Wt 184 lb (83.5 kg)   LMP 11/18/2017 (Exact Date)   BMI 29.70 kg/m  Body mass index is 29.7 kg/m. Constitutional: Well nourished, well developed female in no acute distress.  Neck:  Supple, normal appearance, and no thyromegaly  Cardiovascular: S1, S2 normal, no murmur, rub or gallop, regular rate and rhythm Respiratory:  Clear to auscultation bilaterally. Normal respiratory effort Abdomen: No masses, hernias; diffusely non tender to palpation, non distended Breasts: Patient declines to have breast exam. Neuro/Psych:  Normal mood and affect.  Skin:  Warm and dry.  Lymphatic:  No inguinal lymphadenopathy.   Pelvic exam: is not limited by body habitus External genitalia, Bartholin's glands, Urethra, Skene's glands: within normal limits Vagina: within normal limits and with no blood in the vault  Cervix: normal appearing cervix without discharge or lesions, closed/long/high Uterus:  enlarged, fundal height 27 cm Adnexa:  no mass, fullness, tenderness  Assessment: Ms. Axelson is a 24 y.o. G3P2002 at [redacted]w[redacted]d based on Ultrasound, with an Estimated Date of Delivery:  08/18/2018, presenting for prenatal care.  Plan:  1) Avoid alcoholic beverages. 2) Patient encouraged not to smoke.  3) Discontinue the use of all non-medicinal drugs and chemicals.  4) Take prenatal vitamins daily.  5) Seatbelt use advised 6) Nutrition, food safety (fish, cheese advisories, and high nitrite foods) and exercise discussed. 7) Hospital and practice style delivering at [facility] discussed  8) Patient is asked about travel to areas at risk for the Zika virus, and counseled to avoid travel and exposure to mosquitoes or sexual partners who may have themselves been exposed to the virus. Testing is discussed, and will be ordered as appropriate.  9) Genetic Screening, such as with 1st Trimester Screening, cell free fetal DNA, AFP testing, and Ultrasound, as well  as with amniocentesis and CVS as appropriate, is discussed with patient. She plans to have genetic testing this pregnancy. 10) Early ultrasound at [facility] with ED visit for vaginal bleeding, dating based on this scan at [redacted]w[redacted]d. 11) Fetal heart tones heard via Doppler in the 140s. 12) Labs today, and will return fasting for GTT. 13) Anatomy scan ordered.  Problem list reviewed and updated.  Marcelyn Bruins, CNM [facility], [facility] 06/05/2018

## 2018-06-05 NOTE — Progress Notes (Signed)
NOB Headaches History of anemia

## 2018-06-07 LAB — URINE CULTURE: Organism ID, Bacteria: NO GROWTH

## 2018-06-08 LAB — CERVICOVAGINAL ANCILLARY ONLY
Bacterial vaginitis: POSITIVE — AB
Candida vaginitis: POSITIVE — AB
Chlamydia: POSITIVE — AB
Neisseria Gonorrhea: NEGATIVE
Trichomonas: NEGATIVE

## 2018-06-09 LAB — HEMOGLOBINOPATHY EVALUATION
HGB C: 0 %
HGB S: 0 %
HGB VARIANT: 0 %
Hemoglobin A2 Quantitation: 1.6 % — ABNORMAL LOW (ref 1.8–3.2)
Hemoglobin F Quantitation: 0 % (ref 0.0–2.0)
Hgb A: 98.4 % (ref 96.4–98.8)

## 2018-06-09 LAB — RPR+RH+ABO+RUB AB+AB SCR+CB...
Antibody Screen: NEGATIVE
HIV Screen 4th Generation wRfx: NONREACTIVE
Hematocrit: 24.8 % — ABNORMAL LOW (ref 34.0–46.6)
Hemoglobin: 6.8 g/dL — CL (ref 11.1–15.9)
Hepatitis B Surface Ag: NEGATIVE
MCH: 16.7 pg — ABNORMAL LOW (ref 26.6–33.0)
MCHC: 27.4 g/dL — ABNORMAL LOW (ref 31.5–35.7)
MCV: 61 fL — ABNORMAL LOW (ref 79–97)
Platelets: 315 x10E3/uL (ref 150–450)
RBC: 4.06 x10E6/uL (ref 3.77–5.28)
RDW: 19.5 % — ABNORMAL HIGH (ref 11.7–15.4)
RPR Ser Ql: NONREACTIVE
Rh Factor: POSITIVE
Rubella Antibodies, IGG: 5.89 {index}
Varicella zoster IgG: 1528 {index}
WBC: 7.9 x10E3/uL (ref 3.4–10.8)

## 2018-06-09 LAB — URINE DRUG PANEL 7
Amphetamines, Urine: NEGATIVE ng/mL
Barbiturate Quant, Ur: NEGATIVE ng/mL
Benzodiazepine Quant, Ur: NEGATIVE ng/mL
Cannabinoid Quant, Ur: POSITIVE — AB
Cocaine (Metab.): NEGATIVE ng/mL
Opiate Quant, Ur: NEGATIVE ng/mL
PCP Quant, Ur: NEGATIVE ng/mL

## 2018-06-10 ENCOUNTER — Encounter: Payer: Self-pay | Admitting: Maternal Newborn

## 2018-06-10 ENCOUNTER — Other Ambulatory Visit: Payer: Self-pay | Admitting: Maternal Newborn

## 2018-06-10 DIAGNOSIS — O98813 Other maternal infectious and parasitic diseases complicating pregnancy, third trimester: Secondary | ICD-10-CM

## 2018-06-10 DIAGNOSIS — O99013 Anemia complicating pregnancy, third trimester: Secondary | ICD-10-CM

## 2018-06-10 DIAGNOSIS — B373 Candidiasis of vulva and vagina: Secondary | ICD-10-CM

## 2018-06-10 DIAGNOSIS — N760 Acute vaginitis: Secondary | ICD-10-CM

## 2018-06-10 DIAGNOSIS — B9689 Other specified bacterial agents as the cause of diseases classified elsewhere: Secondary | ICD-10-CM

## 2018-06-10 DIAGNOSIS — A749 Chlamydial infection, unspecified: Secondary | ICD-10-CM

## 2018-06-10 DIAGNOSIS — B3731 Acute candidiasis of vulva and vagina: Secondary | ICD-10-CM

## 2018-06-10 MED ORDER — AZITHROMYCIN 500 MG PO TABS: 1000.0000 mg | ORAL_TABLET | Freq: Once | ORAL | 0 refills | Status: AC

## 2018-06-10 MED ORDER — METRONIDAZOLE 500 MG PO TABS: 500.0000 mg | ORAL_TABLET | Freq: Two times a day (BID) | ORAL | 0 refills | Status: AC

## 2018-06-10 MED ORDER — TERCONAZOLE 0.4 % VA CREA: 1.0000 | TOPICAL_CREAM | Freq: Every day | VAGINAL | 0 refills | Status: AC

## 2018-06-10 NOTE — Progress Notes (Signed)
Rx for Flagyl to treat BV, terconazole to treat yeast infection and azithromycin to treat chlamydia. Discussed results and treatment plans with patient, including treatment of partner for chlamydia. Also discussed hematology referral for anemia and cessation of marijuana use.

## 2018-06-11 ENCOUNTER — Ambulatory Visit
Admission: RE | Admit: 2018-06-11 | Discharge: 2018-06-11 | Disposition: A | Discharge status: Home / Self Care | Payer: Medicaid Other | Source: Ambulatory Visit | Attending: Maternal Newborn | Admitting: Maternal Newborn

## 2018-06-11 ENCOUNTER — Other Ambulatory Visit: Payer: Self-pay

## 2018-06-11 DIAGNOSIS — Z3689 Encounter for other specified antenatal screening: Secondary | ICD-10-CM | POA: Diagnosis present

## 2018-06-11 DIAGNOSIS — O0990 Supervision of high risk pregnancy, unspecified, unspecified trimester: Secondary | ICD-10-CM | POA: Insufficient documentation

## 2018-06-15 IMAGING — US US ABDOMEN LIMITED
1 series · 14 of 25 positions shown · non-contrast
Comparison: None.

CLINICAL DATA: Abdominal pain of the right upper quadrant.

EXAM:
ULTRASOUND ABDOMEN LIMITED RIGHT UPPER QUADRANT

[Series 1: us abdomen limited · 0.19mm/px · 14 of 41 slices shown]
[im 1/41]
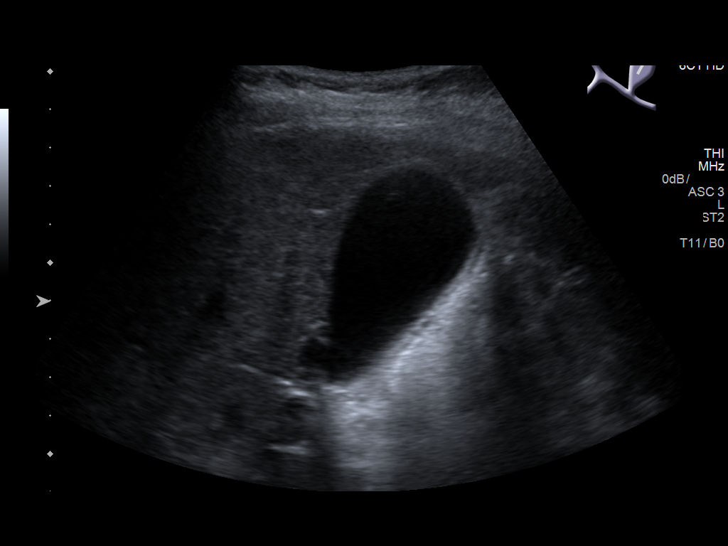
[im 4/41]
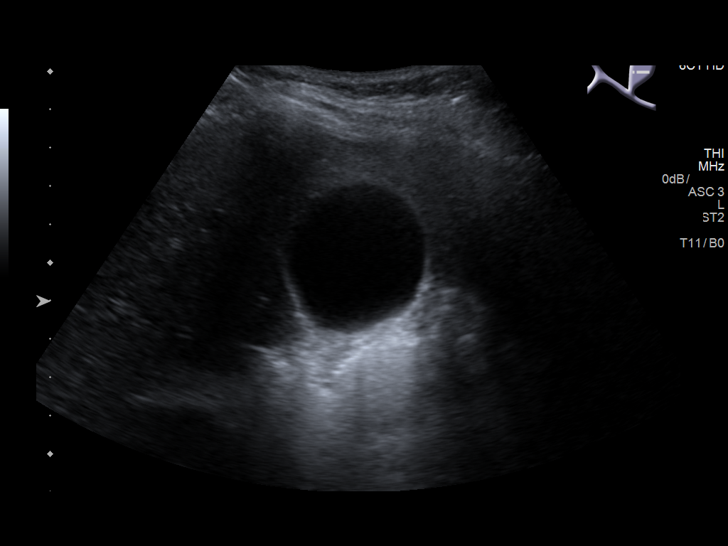
[im 7/41]
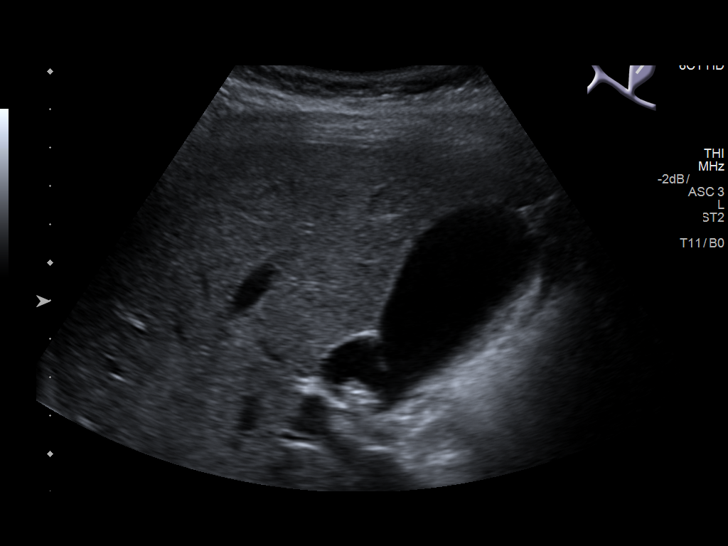
[im 11/41]
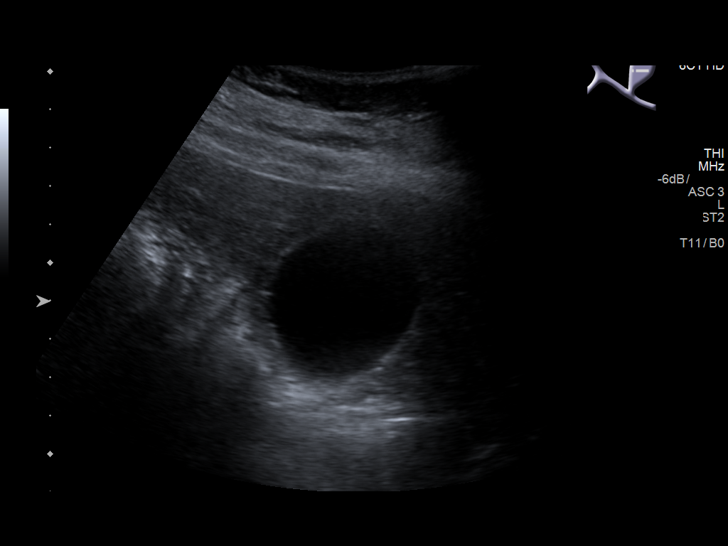
[im 14/41]
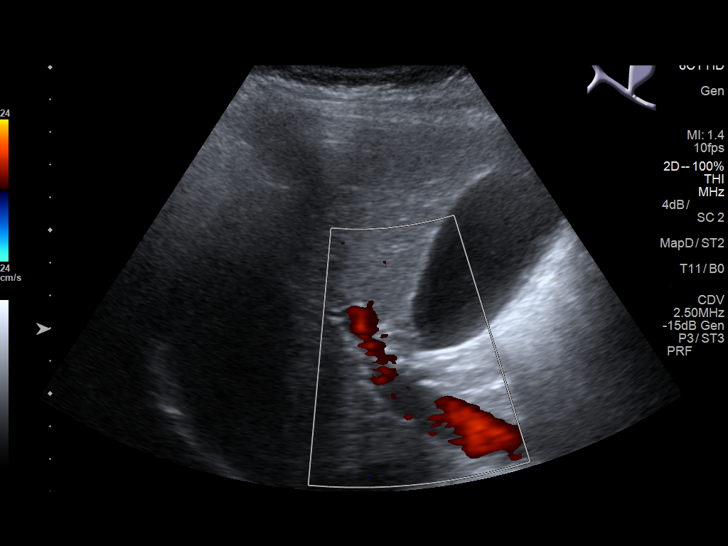
[im 16/41]
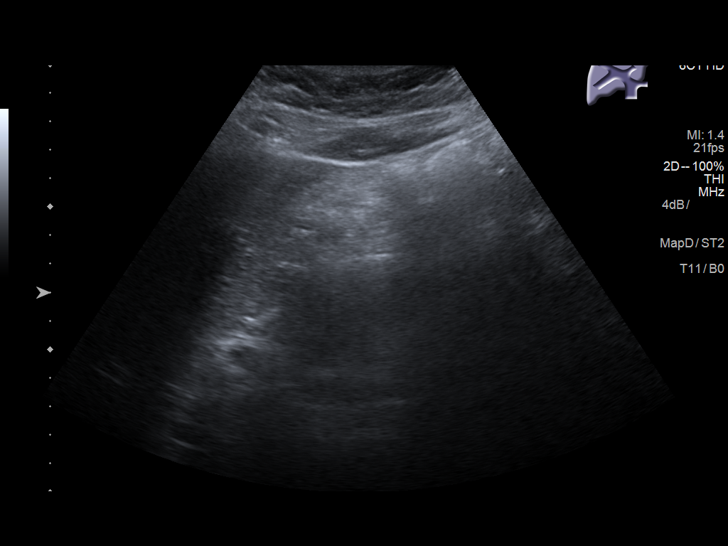
[im 19/41]
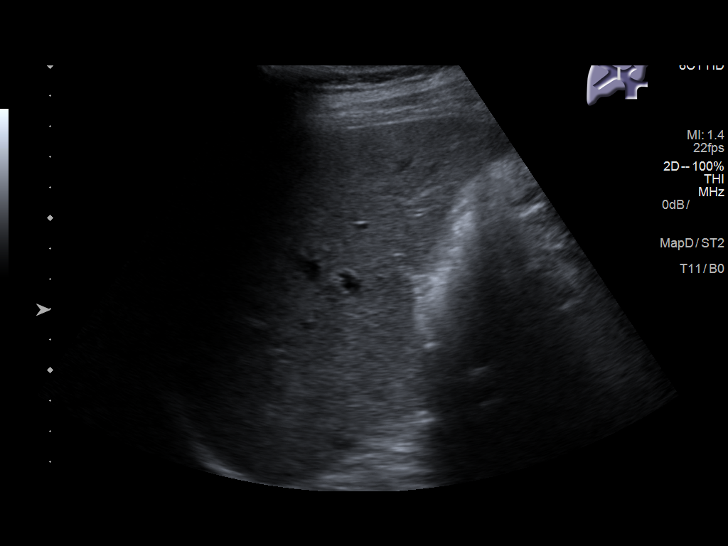
[im 22/41]
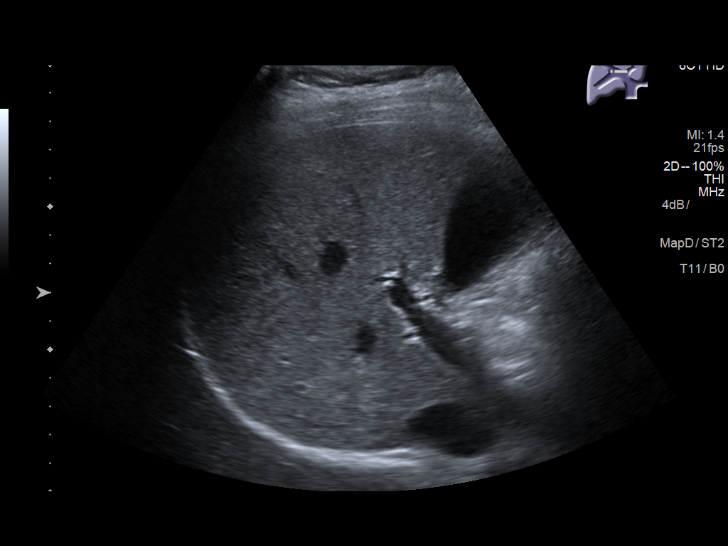
[im 26/41]
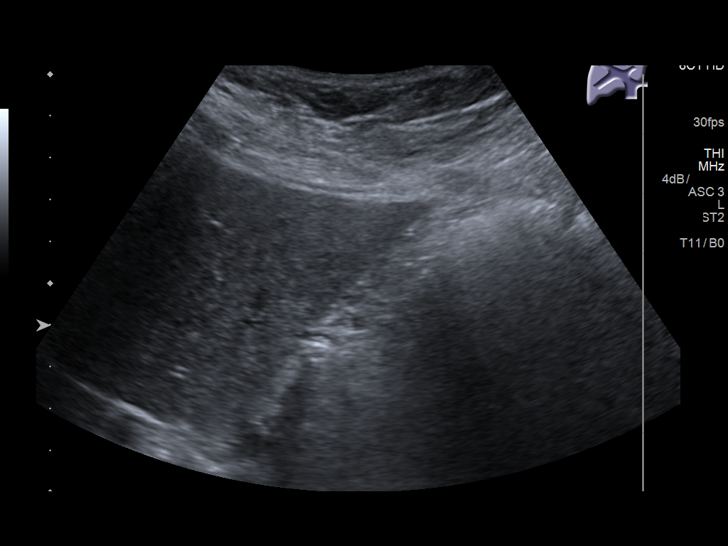
[im 27/41]
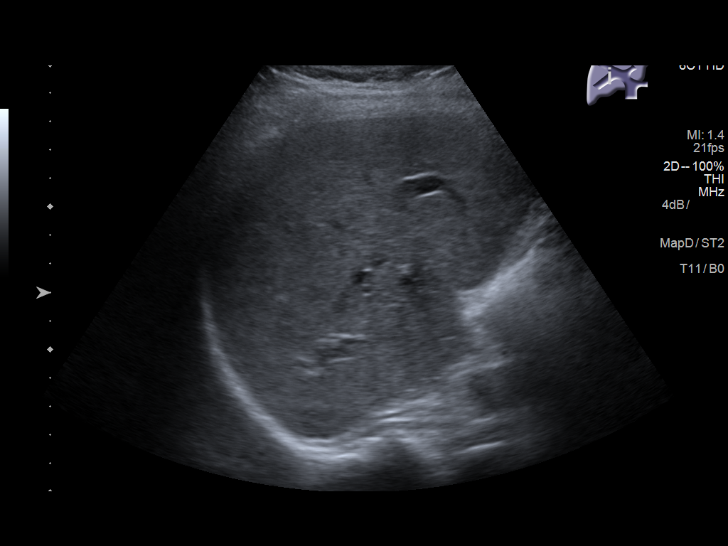
[im 31/41]
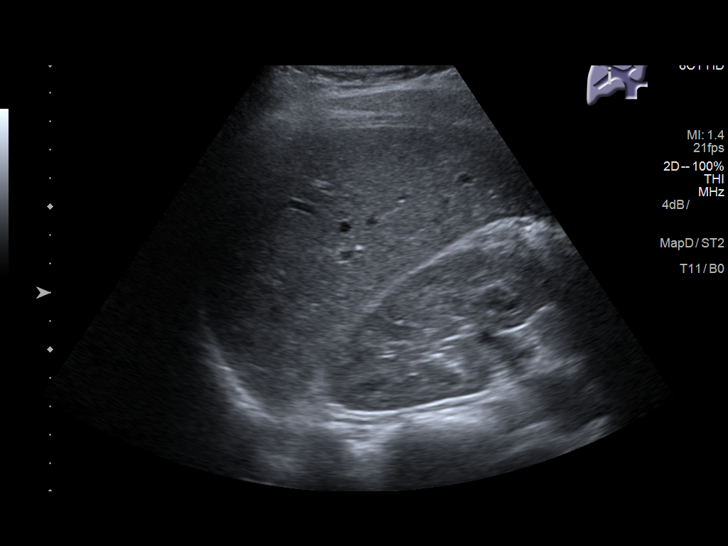
[im 34/41]
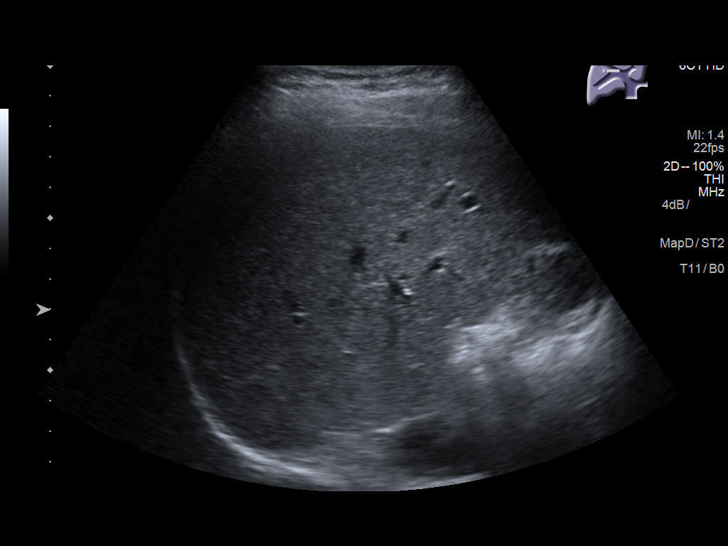
[im 37/41]
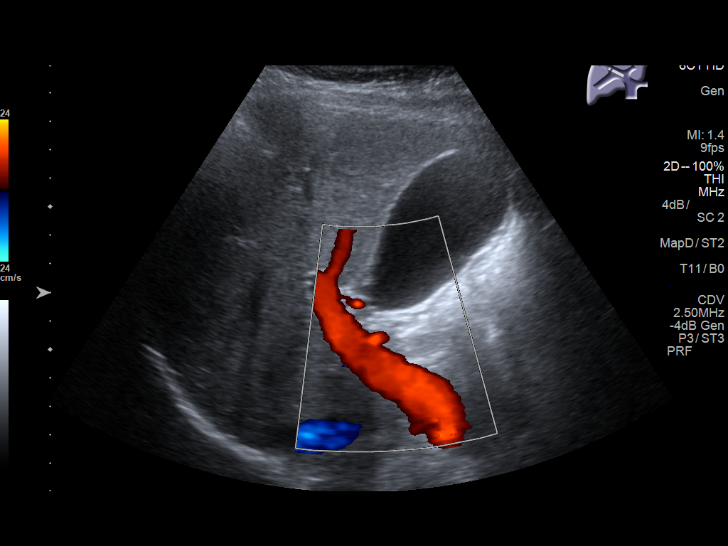
[im 41/41]
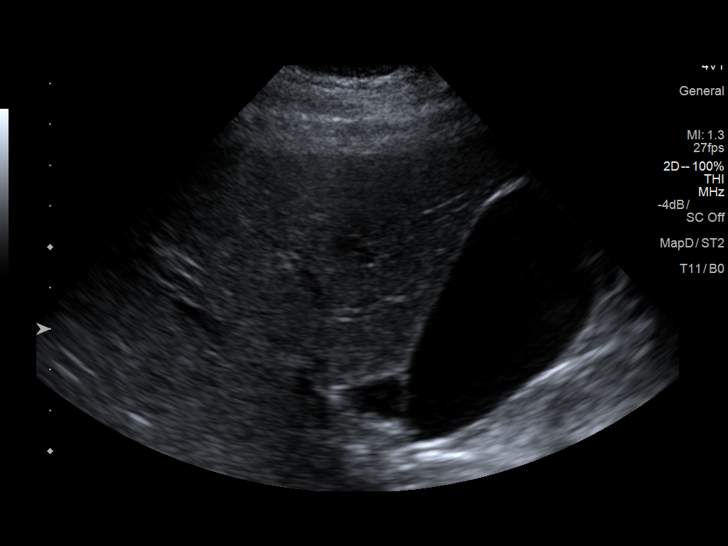

[14 of 25 positions shown; findings below may reference images not displayed]

FINDINGS: Gallbladder:

No gallstones or wall thickening visualized. No sonographic Murphy
sign noted by sonographer.

Common bile duct:

Diameter: 3.1 mm

Liver:

No focal lesion identified. Within normal limits in parenchymal
echogenicity. Portal vein is patent on color Doppler imaging with
normal direction of blood flow towards the liver.
IMPRESSION: Normal right upper quadrant ultrasound.

## 2018-06-16 ENCOUNTER — Other Ambulatory Visit: Payer: Self-pay

## 2018-06-17 ENCOUNTER — Encounter: Payer: Self-pay | Admitting: Advanced Practice Midwife

## 2018-06-17 ENCOUNTER — Inpatient Hospital Stay: Payer: Medicaid Other

## 2018-06-17 ENCOUNTER — Inpatient Hospital Stay: Payer: Medicaid Other | Attending: Oncology | Admitting: Oncology

## 2018-06-17 ENCOUNTER — Other Ambulatory Visit: Payer: Self-pay

## 2018-06-17 ENCOUNTER — Ambulatory Visit: Admission: RE | Admit: 2018-06-17 | Payer: Self-pay | Source: Ambulatory Visit

## 2018-06-17 ENCOUNTER — Other Ambulatory Visit: Payer: Self-pay | Admitting: Advanced Practice Midwife

## 2018-06-17 VITALS — BP 113/66 | HR 91 | Temp 96.3°F | Wt 182.8 lb

## 2018-06-17 VITALS — BP 116/70 | HR 91 | Resp 20

## 2018-06-17 DIAGNOSIS — O99013 Anemia complicating pregnancy, third trimester: Secondary | ICD-10-CM | POA: Insufficient documentation

## 2018-06-17 DIAGNOSIS — IMO0002 Reserved for concepts with insufficient information to code with codable children: Secondary | ICD-10-CM

## 2018-06-17 DIAGNOSIS — O0990 Supervision of high risk pregnancy, unspecified, unspecified trimester: Secondary | ICD-10-CM

## 2018-06-17 DIAGNOSIS — Z0489 Encounter for examination and observation for other specified reasons: Secondary | ICD-10-CM

## 2018-06-17 MED ORDER — SODIUM CHLORIDE 0.9 % IV SOLN
INTRAVENOUS | Status: DC
  Administered 2018-06-17: 13:00:00 via INTRAVENOUS
  Filled 2018-06-17: qty 250

## 2018-06-17 MED ORDER — SODIUM CHLORIDE 0.9 % IV SOLN
510.0000 mg | Freq: Once | INTRAVENOUS | Status: AC
  Administered 2018-06-17: 510 mg via INTRAVENOUS
  Filled 2018-06-17: qty 17

## 2018-06-17 NOTE — Progress Notes (Signed)
Patient here for initial visit for anemia during pregnancy with due date being 08/25/2018.  This is her 3rd pregnancy and she had to receive iron infusion with the last 2 pregnancies with last infusion on 12/18/16.

## 2018-06-17 NOTE — Progress Notes (Signed)
[facility]  [phone] [phone]) [phone]  ID: Hailey Martin OB: 08/07/1995  MR#: [id number]  LYY#:[id number]  Patient Care Team: Patient, No Pcp Per as PCP - General (General Practice)  CHIEF COMPLAINT: Anemia affecting pregnancy in the third trimester.  INTERVAL HISTORY: Patient was last evaluated in clinic in September 2018.  She is referred back once again for having significant iron deficiency anemia in her third trimester pregnancy.  She currently feels well and is asymptomatic.  She is gaining weight appropriately.  She has no neurologic complaints.  She denies any recent fevers or illnesses.  She has no chest pain or shortness of breath.  She denies any nausea, vomiting, constipation, or diarrhea.  She has no melena or hematochezia.  She has no urinary complaints.  Patient feels at her baseline offers no specific complaints today.    REVIEW OF SYSTEMS:   Review of Systems  Constitutional: Negative.  Negative for fever, malaise/fatigue and weight loss.  Respiratory: Negative.  Negative for cough, hemoptysis and shortness of breath.   Cardiovascular: Negative.  Negative for chest pain and leg swelling.  Gastrointestinal: Negative.  Negative for abdominal pain, blood in stool and melena.  Genitourinary: Negative.  Negative for hematuria.  Musculoskeletal: Negative.   Skin: Negative.  Negative for rash.  Neurological: Negative.  Negative for dizziness, focal weakness, weakness and headaches.  Psychiatric/Behavioral: Negative.  The patient is not nervous/anxious.     As per HPI. Otherwise, a complete review of systems is negative.  PAST MEDICAL HISTORY: Past Medical History:  Diagnosis Date   Allergy    Anemia    Dysmenorrhea    Overweight    Seborrhea     PAST SURGICAL HISTORY: Past Surgical History:  Procedure Laterality Date   CESAREAN SECTION N/A 08/29/2015   Procedure: CESAREAN SECTION;  Surgeon: Vena Austria, MD;  Location:  [facility] ORS;  Service: Obstetrics;  Laterality: N/A;   CESAREAN SECTION N/A 01/28/2017   Procedure: CESAREAN SECTION;  Surgeon: Conard Novak, MD;  Location: [facility] ORS;  Service: Obstetrics;  Laterality: N/A;   WISDOM TOOTH EXTRACTION      FAMILY HISTORY: Family History  Problem Relation Age of Onset   Heart failure Father 28   Breast cancer Paternal Grandmother    Diabetes Paternal Grandmother    Hypertension Paternal Grandmother    Colon cancer Paternal Grandfather    Hypertension Paternal Grandfather     ADVANCED DIRECTIVES (Y/N):  N  HEALTH MAINTENANCE: Social History   Tobacco Use   Smoking status: Never Smoker   Smokeless tobacco: Never Used  Substance Use Topics   Alcohol use: No    Alcohol/week: 0.0 standard drinks   Drug use: No     Colonoscopy:  PAP:  Bone density:  Lipid panel:  No Known Allergies  Current Outpatient Medications  Medication Sig Dispense Refill   metroNIDAZOLE (FLAGYL) 500 MG tablet Take 1 tablet (500 mg total) by mouth 2 (two) times daily for 7 days. 14 tablet 0   terconazole (TERAZOL 7) 0.4 % vaginal cream Place 1 applicator vaginally at bedtime for 7 days. (Patient not taking: Reported on 06/17/2018) 45 g 0   No current facility-administered medications for this visit.    Facility-Administered Medications Ordered in Other Visits  Medication Dose Route Frequency Provider Last Rate Last Dose   0.9 %  sodium chloride infusion   Intravenous Continuous Jeralyn Ruths, MD 10 mL/hr at 06/17/18 1231      OBJECTIVE: Vitals:  06/17/18 1142  BP: 113/66  Pulse: 91  Temp: (!) 96.3 F (35.7 C)     Body mass index is 29.5 kg/m.    ECOG FS:0 - Asymptomatic  General: Well-developed, well-nourished, no acute distress. Eyes: Pink conjunctiva, anicteric sclera. HEENT: Normocephalic, moist mucous membranes, clear oropharnyx. Lungs: Clear to auscultation bilaterally. Heart: Regular rate and rhythm. No rubs, murmurs, or  gallops. Abdomen: Appears appropriate for gestational age.   Musculoskeletal: No edema, cyanosis, or clubbing. Neuro: Alert, answering all questions appropriately. Cranial nerves grossly intact. Skin: No rashes or petechiae noted. Psych: Normal affect. Lymphatics: No cervical, calvicular, axillary or inguinal LAD.  LAB RESULTS:  Lab Results  Component Value Date   NA 136 11/19/2016   K 3.4 (L) 11/19/2016   CL 105 11/19/2016   CO2 23 11/19/2016   GLUCOSE 81 11/19/2016   BUN 9 11/19/2016   CREATININE <0.30 (L) 11/19/2016   CALCIUM 8.7 (L) 11/19/2016   PROT 7.2 11/19/2016   ALBUMIN 3.2 (L) 11/19/2016   AST 21 11/19/2016   ALT 13 (L) 11/19/2016   ALKPHOS 100 11/19/2016   BILITOT 0.7 11/19/2016   GFRNONAA NOT CALCULATED 11/19/2016   GFRAA NOT CALCULATED 11/19/2016    Lab Results  Component Value Date   WBC 7.9 06/05/2018   NEUTROABS 4.1 01/22/2018   HGB 6.8 (LL) 06/05/2018   HCT 24.8 (L) 06/05/2018   MCV 61 (L) 06/05/2018   PLT 315 06/05/2018   Lab Results  Component Value Date   IRON 8 (L) 01/22/2018   TIBC 420 01/22/2018   IRONPCTSAT 2 (L) 01/22/2018   Lab Results  Component Value Date   FERRITIN 2 (L) 01/22/2018     STUDIES: US Ob Comp + 14 Wk  Result Date: 06/12/2018 CLINICAL DATA:  High-risk pregnancy.  Fetal anatomy survey. EXAM: OBSTETRICAL ULTRASOUND >14 WKS FINDINGS: Number of Fetuses: 1 Heart Rate:  152 bpm Movement: Present Presentation: Cephalic Previa: No Placental Location: Anterior Amniotic Fluid (Subjective): Normal Amniotic Fluid (Objective): AFI = 18.9 cm (5%ile= 9.2 cm, 95%= 23.1 cm for 29 wks) FETAL BIOMETRY BPD: 7.9cm 31w 5d HC:   28.0cm 30w 5d AC:   26.6cm 30w 5d FL:   5.6cm 29w 2d Current Mean GA: 30w 4d [place] EDC: 08/16/2018 Assigned GA:  29w tod Assigned EDC: 08/25/2018 Estimated Fetal Weight:  1,553g 75%ile FETAL ANATOMY Lateral Ventricles: Appears normal Thalami/CSP: Appears normal Posterior Fossa:  Appears normal Nuchal Region: Appears normal  Upper Lip: Appears normal Spine: Appears normal 4 Chamber Heart on Left: Appears normal LVOT: Appears normal RVOT: Appears normal Stomach on Left: Appears normal 3 Vessel Cord: Appears normal Cord Insertion site: Appears normal Kidneys: Appears normal Bladder: Appears normal Extremities: Appears normal Technically difficult due to: Limited exam due to fetal position. Fetal profile/face not visualized. Maternal Findings: Cervix:  3.5 cm IMPRESSION: Single viable intrauterine pregnancy at 30 weeks 4 days. Cephalic presentation. Exam limited due to fetal position as above. Electronically Signed   By: Maisie Fus  Register   On: 06/12/2018 07:59    ASSESSMENT: Anemia affecting pregnancy in the third trimester.  PLAN:    1. Anemia affecting pregnancy in the third trimester: Patient has a significantly reduced hemoglobin and iron stores.  She had similar lab work with her previous pregnancy and received 2 doses of IV Feraheme in September 2018.  She has a significantly decreased MCV, but hemoglobin electrophoresis is normal.  Proceed with 510 mg IV Feraheme today.  Return to clinic in 1 week for a second infusion.  Patient will then return to clinic the last week of May 2020 prior to her due date for repeat laboratory work, further evaluation, and consideration of additional Feraheme. 2.  Pregnancy: Patient reports her due date is August 25, 2018.  Continue follow-up with OB/GYN as scheduled.  I spent a total of 30 minutes face-to-face with the patient of which greater than 50% of the visit was spent in counseling and coordination of care as detailed above.   Patient expressed understanding and was in agreement with this plan. She also understands that She can call clinic at any time with any questions, concerns, or complaints.    Jeralyn Ruths, MD   06/17/2018 1:03 PM

## 2018-06-17 NOTE — Progress Notes (Signed)
Order placed for follow up anatomy scan.

## 2018-06-22 ENCOUNTER — Ambulatory Visit (INDEPENDENT_AMBULATORY_CARE_PROVIDER_SITE_OTHER): Payer: Self-pay

## 2018-06-22 ENCOUNTER — Other Ambulatory Visit: Payer: Self-pay

## 2018-06-22 ENCOUNTER — Ambulatory Visit: Payer: Self-pay

## 2018-06-22 ENCOUNTER — Ambulatory Visit (INDEPENDENT_AMBULATORY_CARE_PROVIDER_SITE_OTHER): Payer: Self-pay | Admitting: Obstetrics and Gynecology

## 2018-06-22 VITALS — BP 130/56 | Wt 185.0 lb

## 2018-06-22 DIAGNOSIS — Z1379 Encounter for other screening for genetic and chromosomal anomalies: Secondary | ICD-10-CM

## 2018-06-22 DIAGNOSIS — O0990 Supervision of high risk pregnancy, unspecified, unspecified trimester: Secondary | ICD-10-CM

## 2018-06-22 DIAGNOSIS — O0933 Supervision of pregnancy with insufficient antenatal care, third trimester: Secondary | ICD-10-CM

## 2018-06-22 DIAGNOSIS — Z362 Encounter for other antenatal screening follow-up: Secondary | ICD-10-CM

## 2018-06-22 DIAGNOSIS — Z3A31 31 weeks gestation of pregnancy: Secondary | ICD-10-CM

## 2018-06-22 DIAGNOSIS — IMO0002 Reserved for concepts with insufficient information to code with codable children: Secondary | ICD-10-CM

## 2018-06-22 DIAGNOSIS — Z23 Encounter for immunization: Secondary | ICD-10-CM

## 2018-06-22 DIAGNOSIS — Z0489 Encounter for examination and observation for other specified reasons: Secondary | ICD-10-CM

## 2018-06-22 DIAGNOSIS — O0930 Supervision of pregnancy with insufficient antenatal care, unspecified trimester: Secondary | ICD-10-CM

## 2018-06-22 LAB — POCT URINALYSIS DIPSTICK OB: Glucose, UA: NEGATIVE

## 2018-06-22 NOTE — Progress Notes (Signed)
ROB Ultrasound/ It is a BOY!  TDAP given

## 2018-06-22 NOTE — Patient Instructions (Signed)
 COVID-19 and Your Pregnancy FAQ  How can I prevent infection with COVID-19 during my pregnancy? Social distancing is key. Please limit any interactions in public. Try and work from home if possible. Frequently wash your hands after touching possibly contaminated surfaces. Avoid touching your face.  Minimize trips to the store. Consider online ordering when possible.   Should I wear a mask? Masks should only be worn by those experiencing symptoms of COVID-19 or those with confirmed COVID-19 when they are in public or around other individuals.  What are the symptoms of COVID-19? Fever (greater than 100.4 F), dry cough, shortness of breath.  Am I more at risk for COVID-19 since I am pregnant? There is not currently data showing that pregnant women are more adversely impacted by COVID-19 than the general population. However, we know that pregnant women tend to have worse respiratory complications from similar diseases such as the flu and SARS and for this reason should be considered an at-risk population.  What do I do if I am experiencing the symptoms of COVID-19? Testing is being limited because of test availability. If you are experiencing symptoms you should quarantine yourself, and the members of your family, for at least 2 weeks at home.   Please visit this website for more information: https://www.cdc.gov/coronavirus/2019-ncov/if-you-are-sick/steps-when-sick.html  When should I go to the Emergency Room? Please go to the emergency room if you are experiencing ANY of these symptoms*:  1.    Difficulty breathing or shortness of breath 2.    Persistent pain or pressure in the chest 3.    Confusion or difficulty being aroused (or awakened) 4.    Bluish lips or face  *This list is not all inclusive. Please consult our office for any other symptoms that are severe or concerning.  What do I do if I am having difficulty breathing? You should go to the Emergency Room for evaluation. At  this time they have a tent set up for evaluating patients with COVID-19 symptoms.   How will my prenatal care be different because of the COVID-19 pandemic? It has been recommended to reduce the frequency of face-to-face visits and use resources such as telephone and virtual visits when possible. Using a scale, blood pressure machine and fetal doppler at home can further help reduce face-to-face visits. You will be provided with additional information on this topic.  We ask that you come to your visits alone to minimize potential exposures to  COVID-19.  How can I receive childbirth education? At this time in-person classes have been cancelled. You can register for online childbirth education, breastfeeding, and newborn care classes.  Please visit:  [email or web address] for more information  How will my hospital birth experience be different? The hospital is currently limiting visitors. This means that while you are in labor you can only have one person at the hospital with you. Additional family members will not be allowed to wait in the building or outside your room. Your one support person can be the father of the baby, a relative, a doula, or a friend. Once one support person is designated that person will wear a band. This band cannot be shared with multiple people.  How long will I stay in the hospital for after giving birth? It is also recommended that discharge home be expedited during the COVID-19 outbreak. This means staying for 1 day after a vaginal delivery and 2 days after a cesarean section.  What if I have COVID-19 and I am in labor? We   ask that you wear a mask while on labor and delivery. We will try and accommodate you being placed in a room that is capable of filtering the air. Please call ahead if you are in labor and on your way to the hospital. The phone number for labor and delivery at [facility] is [phone].  If I have COVID-19 when my baby  is born how can I prevent my baby from contracting COVID-19? This is an issue that will have to be discussed on a case-by-case basis. Current recommendations suggest providing separate isolation rooms for both the mother and new infant as well as limiting visitors. However, there are practical challenges to this recommendation. The situation will assuredly change and decisions will be influenced by the desires of the mother and availability of space.  Some suggestions are the use of a curtain or physical barrier between mom and infant, hand hygiene, mom wearing a mask, or 6 feet of spacing between a mom and infant.   Can I breastfeed during the COVID-19 pandemic?   Yes, breastfeeding is encouraged.  Can I breastfeed if I have COVID-19? Yes. Covid-19 has not been found in breast milk. This means you cannot give COVID-19 to your child through breast milk. Breast feeding will also help pass antibodies to fight infection to your baby.   What precautions should I take when breastfeeding if I have COVID-19? If a mother and newborn do room-in and the mother wishes to feed at the breast, she should put on a facemask and practice hand hygiene before each feeding.  What precautions should I take when pumping if I have COVID-19? Prior to expressing breast milk, mothers should practice hand hygiene. After each pumping session, all parts that come into contact with breast milk should be thoroughly washed and the entire pump should be appropriately disinfected per the manufacturer's instructions. This expressed breast milk should be fed to the newborn by a healthy caregiver.  What if I am pregnant and work in healthcare? Based on limited data regarding COVID-19 and pregnancy, ACOG currently does not propose creating additional restrictions on pregnant health care personnel because of COVID-19 alone. Pregnant women do not appear to be at higher risk of severe disease related to COVID-19. Pregnant health care  personnel should follow CDC risk assessment and infection control guidelines for health care personnel exposed to patients with suspected or confirmed COVID-19. Adherence to recommended infection prevention and control practices is an important part of protecting all health care personnel in health care settings.    Information on COVID-19 in pregnancy is very limited; however, facilities may want to consider limiting exposure of pregnant health care personnel to patients with confirmed or suspected COVID-19 infection, especially during higher-risk procedures (eg, aerosol-generating procedures), if feasible, based on staffing availability.    

## 2018-06-22 NOTE — Progress Notes (Addendum)
  Routine Prenatal Care Visit  Subjective  Hailey Martin is a 23 y.o. G3P2002 at [redacted]w[redacted]d being seen today for ongoing prenatal care.  She is currently monitored for the following issues for this high-risk pregnancy and has Dysmenorrhea; History of cervicitis; Allergic rhinitis, seasonal; Seborrhea capitis; Iron deficiency anemia; Headache; Marijuana use; Previous cesarean section; Late prenatal care; Anemia affecting pregnancy in third trimester; and Supervision of high risk pregnancy, antepartum on their problem list.  ----------------------------------------------------------------------------------- Patient reports no complaints.    .  .   . Denies leaking of fluid.  ----------------------------------------------------------------------------------- The following portions of the patient's history were reviewed and updated as appropriate: allergies, current medications, past family history, past medical history, past social history, past surgical history and problem list. Problem list updated.   Objective  Blood pressure (!) 130/56, weight 185 lb (83.9 kg), last menstrual period 11/18/2017, unknown if currently breastfeeding. Pregravid weight 170 lb (77.1 kg) Total Weight Gain 15 lb (6.804 kg) Urinalysis:      Fetal Status:           General:  Alert, oriented and cooperative. Patient is in no acute distress.  Skin: Skin is warm and dry. No rash noted.   Cardiovascular: Normal heart rate noted  Respiratory: Normal respiratory effort, no problems with respiration noted  Abdomen: Soft, gravid, appropriate for gestational age.       Pelvic:  Cervical exam deferred        Extremities: Normal range of motion.     ental Status: Normal mood and affect. Normal behavior. Normal judgment and thought content.   Immunization History  Administered Date(s) Administered  . DTaP 11/29/1994, 01/30/1995, 04/01/1995, 03/12/1996, 08/15/1999  . HPV Quadrivalent 03/12/2011, 05/21/2011, 11/01/2011  .  Hepatitis A 11/08/2009, 03/12/2011  . Hepatitis B 09/30/1994, 11/29/1994, 04/01/1995  . HiB (PRP-OMP) 11/29/1994, 01/30/1995, 04/01/1995, 03/12/1996  . IPV 11/29/1994, 01/30/1995, 03/12/1996, 08/15/1999  . MMR 03/12/1996, 08/15/1999  . Meningococcal Conjugate 01/26/2008  . Tdap 10/21/2006, 11/14/2016, 06/22/2018   Us Ob Comp + 14 Wk  Result Date: 06/12/2018 CLINICAL DATA:  High-risk pregnancy.  Fetal anatomy survey. EXAM: OBSTETRICAL ULTRASOUND >14 WKS FINDINGS: Number of Fetuses: 1 Heart Rate:  152 bpm Movement: Present Presentation: Cephalic Previa: No Placental Location: Anterior Amniotic Fluid (Subjective): Normal Amniotic Fluid (Objective): AFI = 18.9 cm (5%ile= 9.2 cm, 95%= 23.1 cm for 29 wks) FETAL BIOMETRY BPD: 7.9cm 31w 5d HC:   28.0cm 30w 5d AC:   26.6cm 30w 5d FL:   5.6cm 29w 2d Current Mean GA: 30w 4d US EDC: 08/16/2018 Assigned GA:  29w tod Assigned EDC: 08/25/2018 Estimated Fetal Weight:  1,553g 75%ile FETAL ANATOMY Lateral Ventricles: Appears normal Thalami/CSP: Appears normal Posterior Fossa:  Appears normal Nuchal Region: Appears normal Upper Lip: Appears normal Spine: Appears normal 4 Chamber Heart on Left: Appears normal LVOT: Appears normal RVOT: Appears normal Stomach on Left: Appears normal 3 Vessel Cord: Appears normal Cord Insertion site: Appears normal Kidneys: Appears normal Bladder: Appears normal Extremities: Appears normal Technically difficult due to: Limited exam due to fetal position. Fetal profile/face not visualized. Maternal Findings: Cervix:  3.5 cm IMPRESSION: Single viable intrauterine pregnancy at 30 weeks 4 days. Cephalic presentation. Exam limited due to fetal position as above. Electronically Signed   By: Thomas  Register   On: 06/12/2018 07:59   Us Ob Follow Up  Result Date: 06/22/2018 Patient Name: Hailey Martin DOB: 08/27/1995 MRN: [id number] ULTRASOUND REPORT Location: [facility] Date of Service: 06/22/2018 Indications: Anatomy follow up ultrasound    Findings: Singleton intrauterine pregnancy is visualized with FHR at 152 BPM. Fetal presentation is Cephalic. Placenta: anterior. Grade: 1 AFI: subjectively normal. Anatomic survey is complete. There is no free peritoneal fluid in the cul de sac. Impression: 1. 24w6dViable Singleton Intrauterine pregnancy previously established criteria. 2. Normal Anatomy Scan is now complete Recommendations: 1.Clinical correlation with the patient's History and Physical Exam. JLillia Dallas RDMS There is a singleton gestation with subjectively normal amniotic fluid volume.  Limited evaluation of the fetal anatomy was performed today, focusing on on anatomic structures not fully visualized at the time of prior study.The visualized fetal anatomical survey appears within normal limits within the resolution of ultrasound as described above, and the anatomic survey is now complete for fetal face.  It must be noted that a normal ultrasound is unable to rule out fetal aneuploidy.  AMalachy Mood MD, FKalamazooOB/GYN, [facility]tis[place] 06/22/2018, 2:50 PM     Assessment   23 y.o. G3P2002 at 357w6dy  08/18/2018, by Ultrasound presenting for routine prenatal visit  Plan   Pregnancy#3 Problems (from 11/15/17 to present)    Problem Noted Resolved   Supervision of high risk pregnancy, antepartum 06/05/2018 by ScRexene AgentCNM No   Overview Addendum 06/10/2018  9:51 AM by ScRexene AgentCNAgua Dulcerenatal Labs  Dating Ultrasound at 10w 2d Blood type: A/Positive/-- (03/13 0940)   Genetic Screen 1 Screen:    AFP:     Quad:     NIPS: Antibody:Negative (03/13 0940)  Anatomic USKoreaRubella: 5.89 (03/13 0940) Varicella: Immune  GTT Early:               Third trimester:  RPR: Non Reactive (03/13 0940)   Rhogam N/A HBsAg: Negative (03/13 0940)   TDaP vaccine 06/22/2018                  Flu Shot: HIV: Non Reactive (03/13 0940)   Baby Food                                GBS:   Contraception  Pap:  08/16/2016, NILM  CBB     CS/VBAC    Support Person                  Gestational age appropriate obstetric precautions including but not limited to vaginal bleeding, contractions, leaking of fluid and fetal movement were reviewed in detail with the patient.    - Desires BTL - C-section scheudled 5/19  Return in about 2 weeks (around 07/06/2018) for RODoolittle AnMalachy MoodMD, FALoura PardonB/GYN, [facility] 06/22/2018, 3:31 PM

## 2018-06-23 ENCOUNTER — Other Ambulatory Visit: Payer: Self-pay | Admitting: Obstetrics and Gynecology

## 2018-06-23 ENCOUNTER — Telehealth: Payer: Self-pay | Admitting: Obstetrics and Gynecology

## 2018-06-23 ENCOUNTER — Other Ambulatory Visit: Payer: Self-pay

## 2018-06-23 LAB — GLUCOSE, 1 HOUR GESTATIONAL: Gestational Diabetes Screen: 61 mg/dL — ABNORMAL LOW (ref 65–139)

## 2018-06-23 NOTE — Telephone Encounter (Signed)
Patient is aware of H&P at [facility] on 08/10/18 @ 10:10am w/ Dr. Bonney Aid, Pre-admit Testing afterwards, and OR on 08/11/18. Per patient, she is pending Medicaid. MyChart code sent via text.

## 2018-06-23 NOTE — Telephone Encounter (Signed)
-----   Message from Vena Austria, MD sent at 06/22/2018  3:36 PM EDT ----- Regarding: C-section Surgery Date: 08/11/2018  LOS: surgery admit  Surgery Booking Request Patient Full Name: Hailey Martin MRN: [id number]  DOB: 1995/12/30  Surgeon: Vena Austria, MD  Requested Surgery Date and Time: 08/11/2018 Primary Diagnosis and Code: Prior Cesarean Secondary Diagnosis and Code: Undesired fertility Surgical Procedure: Cesarean section with tubal ligation L&D Notification:yes Admission Status: surgery admit Length of Surgery: 1hr Special Case Needs: none H&P: day prior (date) Phone Interview or Office Pre-Admit: pre-admit Interpreter: No Language: English Medical Clearance: No Special Scheduling Instructions: none

## 2018-06-24 ENCOUNTER — Inpatient Hospital Stay: Payer: Medicaid Other | Attending: Oncology

## 2018-06-24 ENCOUNTER — Other Ambulatory Visit: Payer: Self-pay

## 2018-06-24 VITALS — BP 116/68 | HR 89 | Temp 97.1°F | Resp 18

## 2018-06-24 DIAGNOSIS — O99013 Anemia complicating pregnancy, third trimester: Secondary | ICD-10-CM | POA: Diagnosis not present

## 2018-06-24 MED ORDER — SODIUM CHLORIDE 0.9 % IV SOLN
510.0000 mg | Freq: Once | INTRAVENOUS | Status: AC
  Administered 2018-06-24: 14:00:00 510 mg via INTRAVENOUS
  Filled 2018-06-24: qty 17

## 2018-06-24 MED ORDER — SODIUM CHLORIDE 0.9 % IV SOLN
Freq: Once | INTRAVENOUS | Status: AC
  Administered 2018-06-24: 14:00:00 via INTRAVENOUS
  Filled 2018-06-24: qty 250

## 2018-06-26 LAB — MATERNIT 21 PLUS CORE, BLOOD
Fetal Fraction: 13
Result (T21): NEGATIVE
Trisomy 13 (Patau syndrome): NEGATIVE
Trisomy 18 (Edwards syndrome): NEGATIVE
Trisomy 21 (Down syndrome): NEGATIVE

## 2018-06-26 NOTE — Telephone Encounter (Signed)
Correction: Monday, 07/06/18 @ 11:30am.

## 2018-06-30 ENCOUNTER — Encounter: Payer: Self-pay | Admitting: Pharmacy Technician

## 2018-06-30 NOTE — Progress Notes (Signed)
Patient has been approved for drug assistance by [employer] for Feraheme. The enrollment period is from 06/17/18-06/16/19 based on self-pay(Medicaid Family Planning). First DOS covered is 06/17/18.

## 2018-07-06 ENCOUNTER — Encounter: Payer: Self-pay | Admitting: Advanced Practice Midwife

## 2018-07-06 ENCOUNTER — Ambulatory Visit (INDEPENDENT_AMBULATORY_CARE_PROVIDER_SITE_OTHER): Payer: Self-pay | Admitting: Advanced Practice Midwife

## 2018-07-06 ENCOUNTER — Other Ambulatory Visit: Payer: Self-pay

## 2018-07-06 VITALS — BP 132/66 | Wt 189.0 lb

## 2018-07-06 DIAGNOSIS — Z3A33 33 weeks gestation of pregnancy: Secondary | ICD-10-CM

## 2018-07-06 DIAGNOSIS — Z3483 Encounter for supervision of other normal pregnancy, third trimester: Secondary | ICD-10-CM

## 2018-07-06 LAB — POCT URINALYSIS DIPSTICK OB: Glucose, UA: NEGATIVE

## 2018-07-06 NOTE — Progress Notes (Signed)
ROB BTL form signed today

## 2018-07-06 NOTE — Progress Notes (Signed)
  Routine Prenatal Care Visit  Subjective  Hailey Martin is a 23 y.o. G3P2002 at [redacted]w[redacted]d being seen today for ongoing prenatal care.  She is currently monitored for the following issues for this high-risk pregnancy and has Dysmenorrhea; History of cervicitis; Allergic rhinitis, seasonal; Seborrhea capitis; Iron deficiency anemia; Headache; Marijuana use; Previous cesarean section; Late prenatal care; Anemia affecting pregnancy in third trimester; and Supervision of high risk pregnancy, antepartum on their problem list.  ----------------------------------------------------------------------------------- Patient reports some leg cramps. She has been drinking more soda than water. .   Contractions: Not present. Vag. Bleeding: None.  Movement: Present. Denies leaking of fluid.  ----------------------------------------------------------------------------------- The following portions of the patient's history were reviewed and updated as appropriate: allergies, current medications, past family history, past medical history, past social history, past surgical history and problem list. Problem list updated.   Objective  Blood pressure 132/66, weight 189 lb (85.7 kg), last menstrual period 11/18/2017 Pregravid weight 170 lb (77.1 kg) Total Weight Gain 19 lb (8.618 kg) Urinalysis: Urine Protein Trace  Urine Glucose Negative  Fetal Status: Fetal Heart Rate (bpm): 139 Fundal Height: 34 cm Movement: Present     General:  Alert, oriented and cooperative. Patient is in no acute distress.  Skin: Skin is warm and dry. No rash noted.   Cardiovascular: Normal heart rate noted  Respiratory: Normal respiratory effort, no problems with respiration noted  Abdomen: Soft, gravid, appropriate for gestational age. Pain/Pressure: Absent     Pelvic:  Cervical exam deferred        Extremities: Normal range of motion.  Edema: None  Mental Status: Normal mood and affect. Normal behavior. Normal judgment and thought content.    Assessment   23 y.o. [id number] at [redacted]w[redacted]d by  08/18/2018, by Ultrasound presenting for routine prenatal visit  Plan   Pregnancy#3 Problems (from 11/15/17 to present)    Problem Noted Resolved   Supervision of high risk pregnancy, antepartum 06/05/2018 by Oswaldo Conroy, CNM No   Overview Addendum 06/22/2018  2:55 PM by Vena Austria, MD    [facility] Prenatal Labs  Dating Ultrasound at 10w 2d Blood type: A/Positive/-- (03/13 0940)   Genetic Screen 1 Screen:    AFP:     Quad:     NIPS: Antibody:Negative (03/13 0940)  Anatomic [place] Complete normal XY Rubella: 5.89 (03/13 0940) Varicella: Immune  GTT :  RPR: Non Reactive (03/13 0940)   Rhogam N/A HBsAg: Negative (03/13 0940)   TDaP vaccine  06/22/2018  Flu Shot: HIV: Non Reactive (03/13 0940)   Baby Food                                GBS:   Contraception  Pap: 08/16/2016, NILM  CBB     CS/VBAC    Support Person                  Preterm labor symptoms and general obstetric precautions including but not limited to vaginal bleeding, contractions, leaking of fluid and fetal movement were reviewed in detail with the patient.  Increase hydration with H2O Epsom salt bath or OTC magnesium supplement  Tubal consent signed today  Return in about 3 weeks (around 07/27/2018) for rob.  Tresea Mall, CNM 07/06/2018 4:12 PM

## 2018-07-08 NOTE — Telephone Encounter (Signed)
Consent signed.

## 2018-07-27 ENCOUNTER — Ambulatory Visit (INDEPENDENT_AMBULATORY_CARE_PROVIDER_SITE_OTHER): Payer: Medicaid Other | Admitting: Obstetrics and Gynecology

## 2018-07-27 ENCOUNTER — Encounter: Payer: Self-pay | Admitting: Certified Nurse Midwife

## 2018-07-27 ENCOUNTER — Other Ambulatory Visit (HOSPITAL_COMMUNITY)
Admission: RE | Admit: 2018-07-27 | Discharge: 2018-07-27 | Disposition: A | Discharge status: Home / Self Care | Payer: Medicaid Other | Source: Ambulatory Visit | Attending: Obstetrics and Gynecology | Admitting: Obstetrics and Gynecology

## 2018-07-27 ENCOUNTER — Other Ambulatory Visit: Payer: Self-pay

## 2018-07-27 VITALS — BP 114/66 | Wt 193.0 lb

## 2018-07-27 DIAGNOSIS — O34219 Maternal care for unspecified type scar from previous cesarean delivery: Secondary | ICD-10-CM

## 2018-07-27 DIAGNOSIS — Z3685 Encounter for antenatal screening for Streptococcus B: Secondary | ICD-10-CM

## 2018-07-27 DIAGNOSIS — Z98891 History of uterine scar from previous surgery: Secondary | ICD-10-CM

## 2018-07-27 DIAGNOSIS — Z113 Encounter for screening for infections with a predominantly sexual mode of transmission: Secondary | ICD-10-CM | POA: Insufficient documentation

## 2018-07-27 DIAGNOSIS — O99013 Anemia complicating pregnancy, third trimester: Secondary | ICD-10-CM

## 2018-07-27 DIAGNOSIS — O0990 Supervision of high risk pregnancy, unspecified, unspecified trimester: Secondary | ICD-10-CM | POA: Insufficient documentation

## 2018-07-27 DIAGNOSIS — Z3A36 36 weeks gestation of pregnancy: Secondary | ICD-10-CM

## 2018-07-27 LAB — POCT URINALYSIS DIPSTICK OB: Glucose, UA: NEGATIVE

## 2018-07-27 LAB — OB RESULTS CONSOLE GC/CHLAMYDIA: Gonorrhea: NEGATIVE

## 2018-07-27 NOTE — Progress Notes (Signed)
    Routine Prenatal Care Visit  Subjective  Hailey Martin is a 23 y.o. G3P2002 at 108w6d being seen today for ongoing prenatal care.  She is currently monitored for the following issues for this high-risk pregnancy and has Dysmenorrhea; History of cervicitis; Allergic rhinitis, seasonal; Seborrhea capitis; Iron deficiency anemia; Headache; Marijuana use; Previous cesarean section; Late prenatal care; Anemia affecting pregnancy in third trimester; and Supervision of high risk pregnancy, antepartum on their problem list.  ----------------------------------------------------------------------------------- Patient reports no complaints.   Contractions: Not present. Vag. Bleeding: None.  Movement: Present. Denies leaking of fluid.  ----------------------------------------------------------------------------------- The following portions of the patient's history were reviewed and updated as appropriate: allergies, current medications, past family history, past medical history, past social history, past surgical history and problem list. Problem list updated.   Objective  Last menstrual period 11/18/2017, unknown if currently breastfeeding. Pregravid weight 170 lb (77.1 kg) Total Weight Gain 23 lb (10.4 kg) Urinalysis:      Fetal Status: Fetal Heart Rate (bpm): 140 Fundal Height: 35 cm Movement: Present  Presentation: Vertex  General:  Alert, oriented and cooperative. Patient is in no acute distress.  Skin: Skin is warm and dry. No rash noted.   Cardiovascular: Normal heart rate noted  Respiratory: Normal respiratory effort, no problems with respiration noted  Abdomen: Soft, gravid, appropriate for gestational age. Pain/Pressure: Absent     Pelvic:  Cervical exam performed Dilation: Fingertip Effacement (%): 50 Station: -3  Extremities: Normal range of motion.     ental Status: Normal mood and affect. Normal behavior. Normal judgment and thought content.     Assessment   23 y.o. [id number] at  [redacted]w[redacted]d by  08/18/2018, by Ultrasound presenting for routine prenatal visit  Plan   Pregnancy#3 Problems (from 11/15/17 to present)    Problem Noted Resolved   Supervision of high risk pregnancy, antepartum 06/05/2018 by Oswaldo Conroy, CNM No   Overview Addendum 06/22/2018  2:55 PM by Vena Austria, MD    [facility] Prenatal Labs  Dating Ultrasound at 10w 2d Blood type: A/Positive/-- (03/13 0940)   Genetic Screen Late entry Antibody:Negative (03/13 0940)  Anatomic [place] Complete normal XY Rubella: 5.89 (03/13 0940) Varicella: Immune  GTT 61 RPR: Non Reactive (03/13 0940)   Rhogam N/A HBsAg: Negative (03/13 0940)   TDaP vaccine  06/22/2018  Flu Shot: HIV: Non Reactive (03/13 0940)   Baby Food                                GBS:   Contraception BTL Pap: 08/16/2016, NILM  CBB   HgbAA  CS/VBAC C-section   Support Person                  Gestational age appropriate obstetric precautions including but not limited to vaginal bleeding, contractions, leaking of fluid and fetal movement were reviewed in detail with the patient.    - GBS and aptima today - still certain about BTL  Return in about 1 week (around 08/03/2018) for ROB in person.  Vena Austria, MD, [facility], [facility] 07/27/2018, 3:36 PM

## 2018-07-27 NOTE — Progress Notes (Signed)
ROB GBS today 

## 2018-07-29 ENCOUNTER — Encounter: Payer: Self-pay | Admitting: Obstetrics and Gynecology

## 2018-07-29 DIAGNOSIS — B951 Streptococcus, group B, as the cause of diseases classified elsewhere: Secondary | ICD-10-CM | POA: Insufficient documentation

## 2018-07-29 LAB — CERVICOVAGINAL ANCILLARY ONLY
Chlamydia: NEGATIVE
Neisseria Gonorrhea: NEGATIVE

## 2018-07-29 LAB — STREP GP B NAA: Strep Gp B NAA: POSITIVE — AB

## 2018-08-03 ENCOUNTER — Encounter: Payer: Self-pay | Admitting: Obstetrics & Gynecology

## 2018-08-03 ENCOUNTER — Other Ambulatory Visit: Payer: Self-pay

## 2018-08-03 ENCOUNTER — Encounter: Payer: Medicaid Other | Admitting: Maternal Newborn

## 2018-08-03 ENCOUNTER — Ambulatory Visit (INDEPENDENT_AMBULATORY_CARE_PROVIDER_SITE_OTHER): Payer: Medicaid Other | Admitting: Obstetrics & Gynecology

## 2018-08-03 VITALS — BP 118/60 | Wt 198.0 lb

## 2018-08-03 DIAGNOSIS — Z98891 History of uterine scar from previous surgery: Secondary | ICD-10-CM

## 2018-08-03 DIAGNOSIS — Z3A37 37 weeks gestation of pregnancy: Secondary | ICD-10-CM

## 2018-08-03 DIAGNOSIS — O34219 Maternal care for unspecified type scar from previous cesarean delivery: Secondary | ICD-10-CM

## 2018-08-03 DIAGNOSIS — O0933 Supervision of pregnancy with insufficient antenatal care, third trimester: Secondary | ICD-10-CM

## 2018-08-03 DIAGNOSIS — O0990 Supervision of high risk pregnancy, unspecified, unspecified trimester: Secondary | ICD-10-CM

## 2018-08-03 DIAGNOSIS — O0930 Supervision of pregnancy with insufficient antenatal care, unspecified trimester: Secondary | ICD-10-CM

## 2018-08-03 NOTE — Progress Notes (Signed)
  Subjective  Fetal Movement? yes Contractions? no Leaking Fluid? no Vaginal Bleeding? no  Objective  BP 118/60   Wt 198 lb (89.8 kg)   LMP 11/18/2017 (Exact Date)   BMI 31.96 kg/m  General: NAD Pumonary: no increased work of breathing Abdomen: gravid, non-tender Extremities: no edema Psychiatric: mood appropriate, affect full  Assessment  23 y.o. [id number] at [redacted]w[redacted]d by  08/18/2018, by Ultrasound presenting for routine prenatal visit  Plan   Problem List Items Addressed This Visit      Other   Previous cesarean section   Late prenatal care   Supervision of high risk pregnancy, antepartum    Other Visit Diagnoses    [redacted] weeks gestation of pregnancy    -  Primary      Pregnancy#3 Problems (from 11/15/17 to present)    Problem Noted Resolved   Positive GBS test 07/29/2018 by Vena Austria, MD No   Supervision of high risk pregnancy, antepartum 06/05/2018 by Oswaldo Conroy, CNM No   Overview Addendum 07/29/2018  8:00 AM by Vena Austria, MD    [facility] Prenatal Labs  Dating Ultrasound at 10w 2d Blood type: A/Positive/-- (03/13 0940)   Genetic Screen To late Antibody:Negative (03/13 0940)  Anatomic [place] Complete normal XY Rubella: 5.89 (03/13 0940) Varicella: Immune  GTT 61 RPR: Non Reactive (03/13 0940)   Rhogam N/A HBsAg: Negative (03/13 0940)   TDaP vaccine  06/22/2018  Flu Shot: HIV: Non Reactive (03/13 0940)   Baby Food  Both                              GBS: positive  Contraception BTL Pap: 08/16/2016, NILM  CBB  No HgbAA  CS/VBAC C-section   Support Person                CS BTL planned 5/19  Covid testing ordered for Fri am  Preop Fri 10:10 Dr Bonney Aid  Labor precuations discussed  Annamarie Major, MD, [facility], [facility] 08/03/2018  3:42 PM

## 2018-08-03 NOTE — Patient Instructions (Addendum)
PRE ADMISSION TESTING For Covid, prior to procedure Friday 9:00-10:00 [facility] entrance (drive up)  Results in 48-72 hours You will not receive notification if test results are negative. If positive for Covid19, your provider will notify you by phone, with additional instructions.  

## 2018-08-04 ENCOUNTER — Other Ambulatory Visit: Payer: Self-pay | Admitting: Obstetrics and Gynecology

## 2018-08-07 ENCOUNTER — Other Ambulatory Visit: Payer: Self-pay

## 2018-08-07 ENCOUNTER — Encounter: Payer: Self-pay | Admitting: Obstetrics and Gynecology

## 2018-08-07 ENCOUNTER — Ambulatory Visit (INDEPENDENT_AMBULATORY_CARE_PROVIDER_SITE_OTHER): Payer: Medicaid Other | Admitting: Obstetrics and Gynecology

## 2018-08-07 ENCOUNTER — Encounter
Admission: RE | Admit: 2018-08-07 | Discharge: 2018-08-07 | Disposition: A | Discharge status: Home / Self Care | Payer: Medicaid Other | Source: Ambulatory Visit | Attending: Obstetrics and Gynecology | Admitting: Obstetrics and Gynecology

## 2018-08-07 ENCOUNTER — Other Ambulatory Visit
Admission: RE | Admit: 2018-08-07 | Discharge: 2018-08-07 | Disposition: A | Discharge status: Home / Self Care | Payer: Medicaid Other | Source: Ambulatory Visit | Attending: Obstetrics and Gynecology | Admitting: Obstetrics and Gynecology

## 2018-08-07 VITALS — BP 116/78 | HR 88 | Ht 66.0 in | Wt 198.0 lb

## 2018-08-07 DIAGNOSIS — Z1159 Encounter for screening for other viral diseases: Secondary | ICD-10-CM | POA: Diagnosis not present

## 2018-08-07 DIAGNOSIS — O34219 Maternal care for unspecified type scar from previous cesarean delivery: Secondary | ICD-10-CM

## 2018-08-07 DIAGNOSIS — Z3A38 38 weeks gestation of pregnancy: Secondary | ICD-10-CM

## 2018-08-07 DIAGNOSIS — O0990 Supervision of high risk pregnancy, unspecified, unspecified trimester: Secondary | ICD-10-CM

## 2018-08-07 DIAGNOSIS — O99820 Streptococcus B carrier state complicating pregnancy: Secondary | ICD-10-CM | POA: Diagnosis not present

## 2018-08-07 DIAGNOSIS — B951 Streptococcus, group B, as the cause of diseases classified elsewhere: Secondary | ICD-10-CM

## 2018-08-07 DIAGNOSIS — Z98891 History of uterine scar from previous surgery: Secondary | ICD-10-CM

## 2018-08-07 DIAGNOSIS — O99013 Anemia complicating pregnancy, third trimester: Secondary | ICD-10-CM | POA: Diagnosis not present

## 2018-08-07 NOTE — Progress Notes (Signed)
Obstetric H&P   Chief Complaint: ROB and C-section H&P  Prenatal Care Provider: [facility]  History of Present Illness: 23 y.o. [id number] 70w3dby 08/18/2018, by Ultrasound presenting [place] and to go over C-section consents.  History of C-section x 2 and desiring BTL.  Pregnancy otherwise complicated by chlamydia infection at [facility], late entry to care, and anemia.  +FM, no LOF, no VB, no ctx.   Pregravid weight 170 lb (77.1 kg) Total Weight Gain 28 lb (12.7 kg)  Pregnancy#3 Problems (from 11/15/17 to present)    Problem Noted Resolved   Positive GBS test 07/29/2018 by SMalachy Mood MD No   Supervision of high risk pregnancy, antepartum 06/05/2018 by SRexene Agent CNM No   Overview Addendum 07/29/2018  8:00 AM by SMalachy Mood MD    [facility] Prenatal Labs  Dating Ultrasound at 10w 2d Blood type: A/Positive/-- (03/13 0940)   Genetic Screen To late Antibody:Negative (03/13 0940)  Anatomic [place]Complete normal XY Rubella: 5.89 (03/13 0940) Varicella: Immune  GTT 61 RPR: Non Reactive (03/13 0940)   Rhogam N/A HBsAg: Negative (03/13 0940)   TDaP vaccine  06/22/2018  Flu Shot: HIV: Non Reactive (03/13 0940)   Baby Food                                GBS: positive  Contraception BTL Pap: 08/16/2016, NILM  CBB   HgbAA  CS/VBAC C-section   Support Person                  Review of Systems: 10 point review of systems negative unless otherwise noted in HPI  Past Medical History: Past Medical History:  Diagnosis Date   Allergy    Anemia    Dysmenorrhea    Overweight    Seborrhea     Past Surgical History: Past Surgical History:  Procedure Laterality Date   CESAREAN SECTION N/A 08/29/2015   Procedure: CESAREAN SECTION;  Surgeon: AMalachy Mood MD;  Location: [facility] ORS;  Service: Obstetrics;  Laterality: N/A;   CESAREAN SECTION N/A 01/28/2017   Procedure: CESAREAN SECTION;  Surgeon: JWill Bonnet MD;  Location: [facility] ORS;  Service: Obstetrics;   Laterality: N/A;   WISDOM TOOTH EXTRACTION      Past Obstetric History: #: 1, Date: 08/29/15, Sex: Female, Weight: 6 lb 5.9 oz (2.89 kg), GA: 426w5dDelivery: None, Apgar1: 8, Apgar5: 9, Living: Living, Birth Comments: None  #: 2, Date: 01/28/17, Sex: Female, Weight: None, GA: 4017w3delivery: C-Section, Low Transverse, Apgar1: None, Apgar5: None, Living: Living, Birth Comments: None  #: 3, Date: None, Sex: None, Weight: None, GA: None, Delivery: None, Apgar1: None, Apgar5: None, Living: None, Birth Comments: None   Family History: Family History  Problem Relation Age of Onset   Heart failure Father 35 74Breast cancer Paternal Grandmother    Diabetes Paternal Grandmother    Hypertension Paternal Grandmother    Colon cancer Paternal Grandfather    Hypertension Paternal Grandfather     Social History: Social History   Socioeconomic History   Marital status: Single    Spouse name: Not on file   Number of children: 1   Years of education: 12   Highest education level: Not on file  Occupational History   Occupation: Deli     Employer: [employer]  Social Needs   Financial resource strain: Not on file   Food insecurity:  Worry: Not on file    Inability: Not on file   Transportation needs:    Medical: Not on file    Non-medical: Not on file  Tobacco Use   Smoking status: Never Smoker   Smokeless tobacco: Never Used  Substance and Sexual Activity   Alcohol use: No    Alcohol/week: 0.0 standard drinks   Drug use: No   Sexual activity: Yes    Partners: Male  Lifestyle   Physical activity:    Days per week: Not on file    Minutes per session: Not on file   Stress: Not on file  Relationships   Social connections:    Talks on phone: Not on file    Gets together: Not on file    Attends religious service: Not on file    Active member of club or organization: Not on file    Attends meetings of clubs or organizations: Not on file     Relationship status: Not on file   Intimate partner violence:    Fear of current or ex partner: Not on file    Emotionally abused: Not on file    Physically abused: Not on file    Forced sexual activity: Not on file  Other Topics Concern   Not on file  Social History Narrative   Not on file    Medications: Prior to Admission medications   Medication Sig Start Date End Date Taking? Authorizing Provider  acetaminophen (TYLENOL) 325 MG tablet Take 650 mg by mouth every 6 (six) hours as needed for moderate pain.    [provider]  Prenatal Vit-Fe Fumarate-FA (PRENATAL MULTIVITAMIN) TABS tablet Take 1 tablet by mouth daily at 12 noon.    [provider]    Allergies: No Known Allergies  Physical Exam: Vitals: Weight 198 lb (89.8 kg), last menstrual period 11/18/2017, unknown if currently breastfeeding.  FHT: 140  General: NAD HEENT: normocephalic, anicteric Pulmonary: No increased work of breathing Cardiovascular: RRR, distal pulses 2+ Abdomen: Gravid, non-tender Genitourinary: deferred Extremities: no edema, erythema, or tenderness Neurologic: Grossly intact Psychiatric: mood appropriate, affect full  No results found.  Labs: No results found for this or any previous visit (from the past 24 hour(s)).  Assessment: 23 y.o. [id number] 14w3dby 08/18/2018, by Ultrasound presenting for ROB and C-section scheduling  Plan: 1) The patient was counseled regarding risk and benefits to proceeding with Cesarean section to expedite delivery.  Risk of cesarean section were discussed including risk of bleeding and need for potential intraoperative or postoperative blood transfusion with a rate of approximately 5% quoted for all Cesarean sections, risk of injury to adjacent organs including but not limited to bowl and bladder, the need for additional surgical procedures to address such injuries, and the risk of infection.    23y.o. [id number] with undesired fertility,  desires permanent sterilization.  Other reversible forms of contraception were discussed with patient; she declines all other modalities. Permanent nature of as well as associated risks of the procedure discussed with patient including but not limited to: risk of regret, permanence of method, bleeding, infection, injury to surrounding organs and need for additional procedures.  Failure risk of 0.5-1% with increased risk of ectopic gestation if pregnancy occurs was also discussed with patient.    2) Fetus - +FHT  3) PNL - Blood type A/Positive/-- (03/13 0940) / Anti-bodyscreen Negative (03/13 0940) / Rubella 5.89 (03/13 0940) / Varicella Immune / RPR Non Reactive (03/13 0940) / HBsAg Negative (03/13  4136) / HIV Non Reactive (03/13 0940) / 1-hr OGTT 61 / GBS Positive (05/04 1601)  4) Immunization History -  Immunization History  Administered Date(s) Administered   DTaP 11/29/1994, 01/30/1995, 04/01/1995, 03/12/1996, 08/15/1999   HPV Quadrivalent 03/12/2011, 05/21/2011, 11/01/2011   Hepatitis A 11/08/2009, 03/12/2011   Hepatitis B 1994[birth year]11[birth year], 11/29/1994, 04/01/1995   HiB (PRP-OMP) 11/29/1994, 01/30/1995, 04/01/1995, 03/12/1996   IPV 11/29/1994, 01/30/1995, 03/12/1996, 08/15/1999   MMR 03/12/1996, 08/15/1999   Meningococcal Conjugate 01/26/2008   Tdap 10/21/2006, 11/14/2016, 06/22/2018    5) Disposition - pending delivery  Malachy Mood, MD, [place] [place] 08/07/2018, 10:29 AM

## 2018-08-07 NOTE — Patient Instructions (Addendum)
Your procedure is scheduled on: Tuesday 08/11/18 Report to [facility] EMERGENCY DEPT AT 5:30 AM. Any questions contact [facility] at [phone].  Remember: Instructions that are not followed completely may result in serious medical risk, up to and including death, or upon the discretion of your surgeon and anesthesiologist your surgery may need to be rescheduled.     _X__ 1. Do not eat food after midnight the night before your procedure.                 No gum chewing or hard candies. You may drink clear liquids up to 2 hours                 before you are scheduled to arrive for your surgery- DO not drink clear                 liquids within 2 hours of the start of your surgery.                 Clear Liquids include:  water, apple juice without pulp, clear carbohydrate                 drink such as Clearfast or Gatorade, Black Coffee or Tea (Do not add                 anything to coffee or tea).  __X__2.  On the morning of surgery brush your teeth with toothpaste and water, you                 may rinse your mouth with mouthwash if you wish.  Do not swallow any              toothpaste of mouthwash.     _X__ 3.  No Alcohol for 24 hours before or after surgery.   _X__ 4.  Do Not Smoke or use e-cigarettes For 24 Hours Prior to Your Surgery.                 Do not use any chewable tobacco products for at least 6 hours prior to                 surgery.  ____  5.  Bring all medications with you on the day of surgery if instructed.   __X__  6.  Notify your doctor if there is any change in your medical condition      (cold, fever, infections).     Do not wear jewelry, make-up, hairpins, clips or nail polish. Do not wear lotions, powders, or perfumes.  Do not shave 48 hours prior to surgery. Men may shave face and neck. Do not bring valuables to the hospital.    [facility] is not responsible for any belongings or valuables.  Contacts, dentures/partials or body  piercings may not be worn into surgery. Bring a case for your contacts, glasses or hearing aids, a denture cup will be supplied. Leave your suitcase in the car. After surgery it may be brought to your room. For patients admitted to the hospital, discharge time is determined by your treatment team.   Patients discharged the day of surgery will not be allowed to drive home.   Please read over the following fact sheets that you were given:   MRSA Information  __X__ Take these medicines the morning of surgery with A SIP OF WATER:    1. None  2.   3.   4.  5.  6.  ____ Fleet Enema (as directed)   __X__ Use CHG Soap/SAGE wipes as directed  ____ Use inhalers on the day of surgery  ____ Stop metformin/Janumet/Farxiga 2 days prior to surgery    ____ Take 1/2 of usual insulin dose the night before surgery. No insulin the morning          of surgery.   ____ Stop Blood Thinners Coumadin/Plavix/Xarelto/Pleta/Pradaxa/Eliquis/Effient/Aspirin  on   Or contact your Surgeon, Cardiologist or Medical Doctor regarding  ability to stop your blood thinners  __X__ Stop Anti-inflammatories 7 days before surgery such as Advil, Ibuprofen, Motrin,  BC or Goodies Powder, Naprosyn, Naproxen, Aleve, Aspirin    __X__ Stop all herbal supplements, fish oil or vitamin E until after surgery.    ____ Bring C-Pap to the hospital.

## 2018-08-10 ENCOUNTER — Encounter: Payer: Self-pay | Admitting: Obstetrics and Gynecology

## 2018-08-10 ENCOUNTER — Inpatient Hospital Stay: Admission: RE | Admit: 2018-08-10 | Payer: Self-pay | Source: Ambulatory Visit

## 2018-08-10 LAB — NOVEL CORONAVIRUS, NAA (HOSP ORDER, SEND-OUT TO REF LAB; TAT 18-24 HRS): SARS-CoV-2, NAA: NOT DETECTED

## 2018-08-11 ENCOUNTER — Inpatient Hospital Stay
Admission: RE | Admit: 2018-08-11 | Discharge: 2018-08-13 | DRG: 784 | Disposition: A | Discharge status: Home / Self Care | Payer: Medicaid Other | Attending: Obstetrics and Gynecology | Admitting: Obstetrics and Gynecology

## 2018-08-11 ENCOUNTER — Encounter
Admission: RE | Disposition: A | Discharge status: Home / Self Care | Payer: Self-pay | Source: Home / Self Care | Attending: Obstetrics and Gynecology

## 2018-08-11 ENCOUNTER — Inpatient Hospital Stay: Payer: Medicaid Other | Admitting: Anesthesiology

## 2018-08-11 ENCOUNTER — Other Ambulatory Visit: Payer: Self-pay

## 2018-08-11 DIAGNOSIS — O9081 Anemia of the puerperium: Secondary | ICD-10-CM | POA: Diagnosis not present

## 2018-08-11 DIAGNOSIS — O99824 Streptococcus B carrier state complicating childbirth: Secondary | ICD-10-CM | POA: Diagnosis not present

## 2018-08-11 DIAGNOSIS — D62 Acute posthemorrhagic anemia: Secondary | ICD-10-CM | POA: Diagnosis not present

## 2018-08-11 DIAGNOSIS — Z3A39 39 weeks gestation of pregnancy: Secondary | ICD-10-CM

## 2018-08-11 DIAGNOSIS — D509 Iron deficiency anemia, unspecified: Secondary | ICD-10-CM

## 2018-08-11 DIAGNOSIS — O34211 Maternal care for low transverse scar from previous cesarean delivery: Principal | ICD-10-CM | POA: Diagnosis present

## 2018-08-11 DIAGNOSIS — O34219 Maternal care for unspecified type scar from previous cesarean delivery: Secondary | ICD-10-CM | POA: Diagnosis present

## 2018-08-11 DIAGNOSIS — B951 Streptococcus, group B, as the cause of diseases classified elsewhere: Secondary | ICD-10-CM

## 2018-08-11 DIAGNOSIS — Z302 Encounter for sterilization: Secondary | ICD-10-CM | POA: Diagnosis not present

## 2018-08-11 DIAGNOSIS — O0990 Supervision of high risk pregnancy, unspecified, unspecified trimester: Secondary | ICD-10-CM

## 2018-08-11 LAB — TYPE AND SCREEN
ABO/RH(D): A POS
Antibody Screen: NEGATIVE

## 2018-08-11 LAB — CBC
HCT: 31.7 % — ABNORMAL LOW (ref 36.0–46.0)
Hemoglobin: 10.1 g/dL — ABNORMAL LOW (ref 12.0–15.0)
MCH: 25.2 pg — ABNORMAL LOW (ref 26.0–34.0)
MCHC: 31.9 g/dL (ref 30.0–36.0)
MCV: 79.1 fL — ABNORMAL LOW (ref 80.0–100.0)
Platelets: 204 10*3/uL (ref 150–400)
RBC: 4.01 MIL/uL (ref 3.87–5.11)
RDW: 27.5 % — ABNORMAL HIGH (ref 11.5–15.5)
WBC: 9.5 10*3/uL (ref 4.0–10.5)
nRBC: 0 % (ref 0.0–0.2)

## 2018-08-11 SURGERY — Surgical Case
Anesthesia: Spinal

## 2018-08-11 MED ORDER — DIPHENHYDRAMINE HCL 50 MG/ML IJ SOLN: 12.5000 mg | INTRAMUSCULAR | Status: DC | PRN

## 2018-08-11 MED ORDER — NALOXONE HCL 0.4 MG/ML IJ SOLN: 0.4000 mg | INTRAMUSCULAR | Status: DC | PRN

## 2018-08-11 MED ORDER — OXYTOCIN 40 UNITS IN NORMAL SALINE INFUSION - SIMPLE MED
2.5000 [IU]/h | INTRAVENOUS | Status: DC
  Filled 2018-08-11: qty 1000

## 2018-08-11 MED ORDER — MISOPROSTOL 200 MCG PO TABS
ORAL_TABLET | ORAL | Status: AC
  Filled 2018-08-11: qty 4

## 2018-08-11 MED ORDER — MORPHINE SULFATE (PF) 0.5 MG/ML IJ SOLN
INTRAMUSCULAR | Status: DC | PRN
  Administered 2018-08-11: .1 mg via INTRATHECAL

## 2018-08-11 MED ORDER — ACETAMINOPHEN 325 MG PO TABS
650.0000 mg | ORAL_TABLET | Freq: Four times a day (QID) | ORAL | Status: AC
  Administered 2018-08-11 – 2018-08-12 (×4): 650 mg via ORAL
  Filled 2018-08-11 (×3): qty 2

## 2018-08-11 MED ORDER — COCONUT OIL OIL: 1.0000 "application " | TOPICAL_OIL | Status: DC | PRN

## 2018-08-11 MED ORDER — LIDOCAINE HCL (PF) 1 % IJ SOLN
INTRAMUSCULAR | Status: AC
  Filled 2018-08-11: qty 30

## 2018-08-11 MED ORDER — AMMONIA AROMATIC IN INHA
RESPIRATORY_TRACT | Status: AC
  Filled 2018-08-11: qty 10

## 2018-08-11 MED ORDER — KETOROLAC TROMETHAMINE 30 MG/ML IJ SOLN
30.0000 mg | Freq: Four times a day (QID) | INTRAMUSCULAR | Status: AC
  Administered 2018-08-11 – 2018-08-12 (×4): 30 mg via INTRAVENOUS
  Filled 2018-08-11 (×3): qty 1

## 2018-08-11 MED ORDER — OXYTOCIN 10 UNIT/ML IJ SOLN
INTRAMUSCULAR | Status: AC
  Filled 2018-08-11: qty 2

## 2018-08-11 MED ORDER — SIMETHICONE 80 MG PO CHEW
80.0000 mg | CHEWABLE_TABLET | Freq: Three times a day (TID) | ORAL | Status: DC
  Administered 2018-08-11 – 2018-08-13 (×7): 80 mg via ORAL
  Filled 2018-08-11 (×6): qty 1

## 2018-08-11 MED ORDER — IBUPROFEN 800 MG PO TABS
800.0000 mg | ORAL_TABLET | Freq: Three times a day (TID) | ORAL | Status: DC
  Administered 2018-08-12 – 2018-08-13 (×4): 800 mg via ORAL
  Filled 2018-08-11 (×4): qty 1

## 2018-08-11 MED ORDER — OXYTOCIN 40 UNITS IN NORMAL SALINE INFUSION - SIMPLE MED
INTRAVENOUS | Status: AC
  Filled 2018-08-11: qty 1000

## 2018-08-11 MED ORDER — BUPIVACAINE HCL (PF) 0.5 % IJ SOLN
INTRAMUSCULAR | Status: DC | PRN
  Administered 2018-08-11: 20 mL

## 2018-08-11 MED ORDER — NALBUPHINE HCL 10 MG/ML IJ SOLN: 5.0000 mg | Freq: Once | INTRAMUSCULAR | Status: DC | PRN

## 2018-08-11 MED ORDER — OXYCODONE-ACETAMINOPHEN 5-325 MG PO TABS
1.0000 | ORAL_TABLET | ORAL | Status: DC | PRN
  Administered 2018-08-12: 1 via ORAL
  Administered 2018-08-12: 2 via ORAL
  Administered 2018-08-12: 1 via ORAL
  Administered 2018-08-13: 2 via ORAL
  Filled 2018-08-11: qty 1
  Filled 2018-08-11: qty 2
  Filled 2018-08-11: qty 1
  Filled 2018-08-11: qty 2

## 2018-08-11 MED ORDER — BUPIVACAINE HCL 0.5 % IJ SOLN
20.0000 mL | INTRAMUSCULAR | Status: DC
  Filled 2018-08-11: qty 20

## 2018-08-11 MED ORDER — LIDOCAINE HCL (PF) 2 % IJ SOLN
INTRAMUSCULAR | Status: AC
  Filled 2018-08-11: qty 10

## 2018-08-11 MED ORDER — SIMETHICONE 80 MG PO CHEW: 80.0000 mg | CHEWABLE_TABLET | ORAL | Status: DC

## 2018-08-11 MED ORDER — MEPERIDINE HCL 25 MG/ML IJ SOLN: 6.2500 mg | INTRAMUSCULAR | Status: DC | PRN

## 2018-08-11 MED ORDER — DIPHENHYDRAMINE HCL 25 MG PO CAPS: 25.0000 mg | ORAL_CAPSULE | ORAL | Status: DC | PRN

## 2018-08-11 MED ORDER — NALBUPHINE HCL 10 MG/ML IJ SOLN
5.0000 mg | INTRAMUSCULAR | Status: DC | PRN
  Administered 2018-08-11: 5 mg via INTRAVENOUS
  Filled 2018-08-11: qty 1

## 2018-08-11 MED ORDER — NALBUPHINE HCL 10 MG/ML IJ SOLN: 5.0000 mg | INTRAMUSCULAR | Status: DC | PRN

## 2018-08-11 MED ORDER — FENTANYL CITRATE (PF) 100 MCG/2ML IJ SOLN
INTRAMUSCULAR | Status: DC | PRN
  Administered 2018-08-11: 15 ug via INTRATHECAL

## 2018-08-11 MED ORDER — SODIUM CHLORIDE 0.9 % IV SOLN
INTRAVENOUS | Status: DC | PRN
  Administered 2018-08-11: 30 ug/min via INTRAVENOUS

## 2018-08-11 MED ORDER — SOD CITRATE-CITRIC ACID 500-334 MG/5ML PO SOLN
30.0000 mL | ORAL | Status: AC
  Administered 2018-08-11: 30 mL via ORAL
  Filled 2018-08-11: qty 30

## 2018-08-11 MED ORDER — CEFAZOLIN SODIUM-DEXTROSE 2-4 GM/100ML-% IV SOLN
2.0000 g | INTRAVENOUS | Status: AC
  Administered 2018-08-11: 2 g via INTRAVENOUS
  Filled 2018-08-11: qty 100

## 2018-08-11 MED ORDER — EPHEDRINE SULFATE 50 MG/ML IJ SOLN
INTRAMUSCULAR | Status: AC
  Filled 2018-08-11: qty 1

## 2018-08-11 MED ORDER — OXYCODONE HCL 5 MG PO TABS
5.0000 mg | ORAL_TABLET | ORAL | Status: DC | PRN
  Administered 2018-08-12: 5 mg via ORAL
  Filled 2018-08-11: qty 1

## 2018-08-11 MED ORDER — OXYCODONE HCL 5 MG PO TABS: 10.0000 mg | ORAL_TABLET | ORAL | Status: DC | PRN

## 2018-08-11 MED ORDER — SENNOSIDES-DOCUSATE SODIUM 8.6-50 MG PO TABS
2.0000 | ORAL_TABLET | ORAL | Status: DC
  Administered 2018-08-12 – 2018-08-13 (×2): 2 via ORAL
  Filled 2018-08-11 (×2): qty 2

## 2018-08-11 MED ORDER — WITCH HAZEL-GLYCERIN EX PADS: 1.0000 "application " | MEDICATED_PAD | CUTANEOUS | Status: DC | PRN

## 2018-08-11 MED ORDER — ONDANSETRON HCL 4 MG/2ML IJ SOLN: 4.0000 mg | Freq: Three times a day (TID) | INTRAMUSCULAR | Status: DC | PRN

## 2018-08-11 MED ORDER — LACTATED RINGERS IV SOLN
INTRAVENOUS | Status: DC | PRN
  Administered 2018-08-11: 08:00:00 via INTRAVENOUS

## 2018-08-11 MED ORDER — DIPHENHYDRAMINE HCL 25 MG PO CAPS: 25.0000 mg | ORAL_CAPSULE | Freq: Four times a day (QID) | ORAL | Status: DC | PRN

## 2018-08-11 MED ORDER — FENTANYL CITRATE (PF) 100 MCG/2ML IJ SOLN
INTRAMUSCULAR | Status: AC
  Filled 2018-08-11: qty 2

## 2018-08-11 MED ORDER — ONDANSETRON HCL 4 MG/2ML IJ SOLN
INTRAMUSCULAR | Status: DC | PRN
  Administered 2018-08-11: 4 mg via INTRAVENOUS

## 2018-08-11 MED ORDER — KETOROLAC TROMETHAMINE 30 MG/ML IJ SOLN: 30.0000 mg | Freq: Four times a day (QID) | INTRAMUSCULAR | Status: AC

## 2018-08-11 MED ORDER — SUCCINYLCHOLINE CHLORIDE 20 MG/ML IJ SOLN
INTRAMUSCULAR | Status: AC
  Filled 2018-08-11: qty 1

## 2018-08-11 MED ORDER — KETOROLAC TROMETHAMINE 30 MG/ML IJ SOLN
INTRAMUSCULAR | Status: AC
  Administered 2018-08-11: 30 mg via INTRAVENOUS
  Filled 2018-08-11: qty 1

## 2018-08-11 MED ORDER — LACTATED RINGERS IV SOLN
INTRAVENOUS | Status: DC
  Administered 2018-08-11: 21:00:00 via INTRAVENOUS

## 2018-08-11 MED ORDER — BUPIVACAINE IN DEXTROSE 0.75-8.25 % IT SOLN
INTRATHECAL | Status: DC | PRN
  Administered 2018-08-11: 1.6 mL via INTRATHECAL

## 2018-08-11 MED ORDER — ACETAMINOPHEN 325 MG PO TABS
ORAL_TABLET | ORAL | Status: AC
  Administered 2018-08-11: 650 mg via ORAL
  Filled 2018-08-11: qty 2

## 2018-08-11 MED ORDER — PRENATAL MULTIVITAMIN CH
1.0000 | ORAL_TABLET | Freq: Every day | ORAL | Status: DC
  Administered 2018-08-12 (×2): 1 via ORAL
  Filled 2018-08-11 (×2): qty 1

## 2018-08-11 MED ORDER — SODIUM CHLORIDE 0.9% FLUSH: 3.0000 mL | INTRAVENOUS | Status: DC | PRN

## 2018-08-11 MED ORDER — SIMETHICONE 80 MG PO CHEW
80.0000 mg | CHEWABLE_TABLET | ORAL | Status: DC | PRN
  Administered 2018-08-12 – 2018-08-13 (×2): 80 mg via ORAL
  Filled 2018-08-11 (×3): qty 1

## 2018-08-11 MED ORDER — MENTHOL 3 MG MT LOZG
1.0000 | LOZENGE | OROMUCOSAL | Status: DC | PRN
  Filled 2018-08-11: qty 9

## 2018-08-11 MED ORDER — MORPHINE SULFATE (PF) 0.5 MG/ML IJ SOLN
INTRAMUSCULAR | Status: AC
  Filled 2018-08-11: qty 10

## 2018-08-11 MED ORDER — BUPIVACAINE 0.25 % ON-Q PUMP DUAL CATH 400 ML
400.0000 mL | INJECTION | Status: DC
  Filled 2018-08-11: qty 400

## 2018-08-11 MED ORDER — PHENYLEPHRINE HCL (PRESSORS) 10 MG/ML IV SOLN
INTRAVENOUS | Status: DC | PRN
  Administered 2018-08-11: 100 ug via INTRAVENOUS

## 2018-08-11 MED ORDER — DIBUCAINE (PERIANAL) 1 % EX OINT: 1.0000 "application " | TOPICAL_OINTMENT | CUTANEOUS | Status: DC | PRN

## 2018-08-11 MED ORDER — BUPIVACAINE HCL (PF) 0.5 % IJ SOLN
INTRAMUSCULAR | Status: AC
  Filled 2018-08-11: qty 30

## 2018-08-11 MED ORDER — OXYTOCIN 40 UNITS IN NORMAL SALINE INFUSION - SIMPLE MED
INTRAVENOUS | Status: DC | PRN
  Administered 2018-08-11: 100 mL via INTRAVENOUS
  Administered 2018-08-11: 500 mL via INTRAVENOUS

## 2018-08-11 SURGICAL SUPPLY — 31 items
BAG COUNTER SPONGE EZ (MISCELLANEOUS) ×2 IMPLANT
CANISTER SUCT 3000ML PPV (MISCELLANEOUS) ×3 IMPLANT
CATH KIT ON-Q SILVERSOAK 5 (CATHETERS) ×2 IMPLANT
CATH KIT ON-Q SILVERSOAK 5IN (CATHETERS) ×6 IMPLANT
CHLORAPREP W/TINT 26 (MISCELLANEOUS) ×6 IMPLANT
CLOSURE WOUND 1/2 X4 (GAUZE/BANDAGES/DRESSINGS) ×1
COUNTER SPONGE BAG EZ (MISCELLANEOUS) ×1
DERMABOND ADVANCED (GAUZE/BANDAGES/DRESSINGS) ×2
DERMABOND ADVANCED .7 DNX12 (GAUZE/BANDAGES/DRESSINGS) ×1 IMPLANT
DRSG OPSITE POSTOP 4X10 (GAUZE/BANDAGES/DRESSINGS) ×3 IMPLANT
DRSG TELFA 3X8 NADH (GAUZE/BANDAGES/DRESSINGS) ×3 IMPLANT
ELECT CAUTERY BLADE 6.4 (BLADE) ×3 IMPLANT
ELECT REM PT RETURN 9FT ADLT (ELECTROSURGICAL) ×3
ELECTRODE REM PT RTRN 9FT ADLT (ELECTROSURGICAL) ×1 IMPLANT
GAUZE SPONGE 4X4 12PLY STRL (GAUZE/BANDAGES/DRESSINGS) ×3 IMPLANT
GLOVE BIO SURGEON STRL SZ7 (GLOVE) ×3 IMPLANT
GLOVE INDICATOR 7.5 STRL GRN (GLOVE) ×3 IMPLANT
GOWN STRL REUS W/ TWL LRG LVL3 (GOWN DISPOSABLE) ×3 IMPLANT
GOWN STRL REUS W/TWL LRG LVL3 (GOWN DISPOSABLE) ×6
NS IRRIG 1000ML POUR BTL (IV SOLUTION) ×3 IMPLANT
PACK C SECTION AR (MISCELLANEOUS) ×3 IMPLANT
PAD DRESSING TELFA 3X8 NADH (GAUZE/BANDAGES/DRESSINGS) ×1 IMPLANT
PAD OB MATERNITY 4.3X12.25 (PERSONAL CARE ITEMS) ×3 IMPLANT
PAD PREP 24X41 OB/GYN DISP (PERSONAL CARE ITEMS) ×3 IMPLANT
PENCIL SMOKE ULTRAEVAC 22 CON (MISCELLANEOUS) ×3 IMPLANT
STRIP CLOSURE SKIN 1/2X4 (GAUZE/BANDAGES/DRESSINGS) ×2 IMPLANT
SUT MNCRL AB 4-0 PS2 18 (SUTURE) ×3 IMPLANT
SUT PDS AB 1 TP1 96 (SUTURE) ×6 IMPLANT
SUT VIC AB 0 CTX 36 (SUTURE) ×4
SUT VIC AB 0 CTX36XBRD ANBCTRL (SUTURE) ×2 IMPLANT
SUT VIC AB 2-0 CT1 36 (SUTURE) ×3 IMPLANT

## 2018-08-11 NOTE — Anesthesia Preprocedure Evaluation (Signed)
Anesthesia Evaluation  Patient identified by MRN, date of birth, ID band Patient awake    Reviewed: Allergy & Precautions, NPO status , Patient's Chart, lab work & pertinent test results  History of Anesthesia Complications Negative for: history of anesthetic complications  Airway Mallampati: III  TM Distance: >3 FB Neck ROM: Full    Dental no notable dental hx.    Pulmonary neg pulmonary ROS, neg sleep apnea, neg COPD,    breath sounds clear to auscultation- rhonchi (-) wheezing      Cardiovascular Exercise Tolerance: Good (-) hypertension(-) CAD and (-) Past MI  Rhythm:Regular Rate:Normal - Systolic murmurs and - Diastolic murmurs    Neuro/Psych  Headaches, negative psych ROS   GI/Hepatic negative GI ROS, Neg liver ROS,   Endo/Other  negative endocrine ROSneg diabetes  Renal/GU negative Renal ROS     Musculoskeletal negative musculoskeletal ROS (+)   Abdominal (+) + obese,   Peds  Hematology  (+) anemia ,   Anesthesia Other Findings 2 prior csections  Reproductive/Obstetrics (+) Pregnancy                             Lab Results  Component Value Date   WBC 9.5 08/11/2018   HGB 10.1 (L) 08/11/2018   HCT 31.7 (L) 08/11/2018   MCV 79.1 (L) 08/11/2018   PLT 204 08/11/2018    Anesthesia Physical Anesthesia Plan  ASA: II  Anesthesia Plan: Spinal   Post-op Pain Management:    Induction:   PONV Risk Score and Plan: 2 and Ondansetron  Airway Management Planned: Natural Airway  Additional Equipment:   Intra-op Plan:   Post-operative Plan:   Informed Consent: I have reviewed the patients History and Physical, chart, labs and discussed the procedure including the risks, benefits and alternatives for the proposed anesthesia with the patient or authorized representative who has indicated his/her understanding and acceptance.     Dental advisory given  Plan Discussed with:  CRNA and Anesthesiologist  Anesthesia Plan Comments:         Anesthesia Quick Evaluation

## 2018-08-11 NOTE — Anesthesia Post-op Follow-up Note (Signed)
Anesthesia QCDR form completed.        

## 2018-08-11 NOTE — Transfer of Care (Signed)
Immediate Anesthesia Transfer of Care Note  Patient: Hailey Martin  Procedure(s) Performed: CESAREAN SECTION WITH TUBAL LIGATION (N/A )  Patient Location: PACU  Anesthesia Type:Spinal  Level of Consciousness: awake, alert  and oriented  Airway & Oxygen Therapy: Patient Spontanous Breathing and Patient connected to nasal cannula oxygen  Post-op Assessment: Report given to RN and Post -op Vital signs reviewed and stable  Post vital signs: Reviewed and stable  Last Vitals:  Vitals Value Taken Time  BP 117/73 08/11/2018  9:02 AM  Temp    Pulse 70 08/11/2018  9:14 AM  Resp 15 08/11/2018  9:14 AM  SpO2 100 % 08/11/2018  9:14 AM  Vitals shown include unvalidated device data.  Last Pain:  Vitals:   08/11/18 0648  TempSrc:   PainSc: 0-No pain         Complications: No apparent anesthesia complications

## 2018-08-11 NOTE — Anesthesia Procedure Notes (Signed)
Spinal  Patient location during procedure: OR Start time: 08/11/2018 7:44 AM End time: 08/11/2018 7:52 AM Staffing Anesthesiologist: Penwarden, Amy, MD Performed: anesthesiologist  Preanesthetic Checklist Completed: patient identified, site marked, surgical consent, pre-op evaluation, timeout performed, IV checked, risks and benefits discussed and monitors and equipment checked Spinal Block Patient position: sitting Prep: ChloraPrep Patient monitoring: heart rate, continuous pulse ox, blood pressure and cardiac monitor Approach: midline Location: L4-5 Injection technique: single-shot Needle Needle type: Introducer and Pencil-Tip  Needle gauge: 24 G Needle length: 9 cm Additional Notes Negative paresthesia. Negative blood return. Positive free-flowing CSF. Expiration date of kit checked and confirmed. Patient tolerated procedure well, without complications.       

## 2018-08-11 NOTE — H&P (Signed)
Date of Initial H&P: 08/07/2018  History reviewed, patient examined, no change in status, stable for surgery.

## 2018-08-11 NOTE — Anesthesia Procedure Notes (Signed)
Date/Time: 08/11/2018 7:45 AM Performed by: Henrietta Hoover, CRNA Pre-anesthesia Checklist: Patient identified, Emergency Drugs available, Suction available, Patient being monitored and Timeout performed Oxygen Delivery Method: Nasal cannula

## 2018-08-11 NOTE — Discharge Summary (Signed)
Obstetric Discharge Summary Reason for Admission: cesarean section Prenatal Procedures: none Intrapartum Procedures: cesarean: low cervical, transverse and tubal ligation Postpartum Procedures: none Complications-Operative and Postpartum: none Hemoglobin  Date Value Ref Range Status  08/12/2018 7.9 (L) 12.0 - 15.0 g/dL Final  90/21/1155 6.8 (LL) 11.1 - 15.9 g/dL Final   HCT  Date Value Ref Range Status  08/12/2018 25.7 (L) 36.0 - 46.0 % Final   Hematocrit  Date Value Ref Range Status  06/05/2018 24.8 (L) 34.0 - 46.6 % Final    Physical Exam:  General: alert, cooperative and no distress Lochia: appropriate Uterine Fundus: firm Incision: healing well, clean, dry, intact DVT Evaluation: No evidence of DVT seen on physical exam. No cords or calf tenderness. No significant calf/ankle edema.  Discharge Diagnoses: Term Pregnancy-delivered  Discharge Information: Date: 08/13/2018 Activity: pelvic rest Diet: routine Allergies as of 08/13/2018   No Known Allergies     Medication List    STOP taking these medications   acetaminophen 325 MG tablet Commonly known as:  TYLENOL     TAKE these medications   ibuprofen 800 MG tablet Commonly known as:  ADVIL Take 1 tablet (800 mg total) by mouth every 8 (eight) hours.   oxyCODONE-acetaminophen 5-325 MG tablet Commonly known as:  PERCOCET/ROXICET Take 1 tablet by mouth every 4 (four) hours as needed (breakthrough pain).   prenatal multivitamin Tabs tablet Take 1 tablet by mouth daily at 12 noon.   simethicone 80 MG chewable tablet Commonly known as:  MYLICON Chew 1 tablet (80 mg total) by mouth daily. Start taking on:  Aug 14, 2018            Discharge Care Instructions  (From admission, onward)         Start     Ordered   08/13/18 0000  Discharge wound care:    Comments:  Perform wound care instructions   08/13/18 1230          Condition: stable Instructions: refer to practice specific booklet Discharge  to: home Follow-up Information    Vena Austria, MD In 1 week.   Specialty:  Obstetrics and Gynecology Why:  For wound re-check Contact information: [address] [place] [address] [phone]           Newborn Data: Live born female  Birth Weight: 6 lb 13.7 oz (3110 g) APGAR: 8, 9  Newborn Delivery   Birth date/time:  08/11/2018 08:12:00 Delivery type:  C-Section, Low Transverse Trial of labor:  No C-section categorization:  Repeat     Home with mother.  Thomasene Mohair, MD 08/13/2018, 12:31 PM

## 2018-08-11 NOTE — Op Note (Signed)
Preoperative Diagnosis: 1) 23 y.o. [id number] at 7148w0d 2) Desires permanent surgical sterilization 3) History of prior cesarean section  Postoperative Diagnosis: 1) 23 y.o. [id number] at 3648w0d 2) Desires permanent surgical sterilization 3) History of prior cesarean section  Operation Performed: Repeat low transverse C-section via pfannenstiel skin incision and Pomeroy tubal ligation  Anesthesia: Spinal  Primary Surgeon: Vena AustriaAndreas Asuna Peth, MD  Assistant: Dr/ Ardeen Jourdainhristanna Schumann this surgery required a high level surgical assistant with none other readily available  Preoperative Antibiotics: 2g ancef  Estimated Blood Loss: 600 mL  IV Fluids: 1300mL  Drains or Tubes: Foley to gravity drainage, ON-Q catheter system  Implants: none  Specimens Removed: bilateral portion of fallopian tube  Complications: none  Intraoperative Findings:  Normal tubes ovaries and uterus.  Delivery resulted in the birth of a liveborn female, APGAR (1 MIN): 8   APGAR (5 MINS): 9, weight pending  Patient Condition: stable  Procedure in Detail:  Patient was taken to the operating room were she was administered regional anesthesia.  She was positioned in the supine position, prepped and draped in the  Usual sterile fashion.  Prior to proceeding with the case a time out was performed and the level of anesthetic was checked and noted to be adequate.  Utilizing the scalpel a pfannenstiel skin incision was made 2cm above the pubic symphysis utilizing the patient's pre-existing scar and carried down sharply to the the level of the rectus fascia.  The fascia was incised in the midline using the scalpel and then extended using mayo scissors.  The superior border of the rectus fascia was grasped with two Kocher clamps and the underlying rectus muscles were dissected of the fascia using blunt dissection.  The median raphae was incised using Mayo scissors.   The inferior border of the rectus fascia was dissected of the rectus  muscles in a similar fashion.  The midline was identified, the peritoneum was entered bluntly and expanded using manual tractions.  The uterus was noted to be in a none rotated position.  Next the bladder blade was placed retracting the bladder caudally.  A bladder flap was not created.  A low transverse incision was scored on the lower uterine segment.  The hysterotomy was entered bluntly using the operators finger.  The hysterotomy incision was extended using manual traction.  The operators hand was placed within the hysterotomy position noting the fetus to be within the OA position.  The vertex was grasped, flexed, brought to the incision, and delivered a traumatically using fundal pressure.  The remainder of the body delivered with ease.  The infant was suctioned, cord was clamped and cut before handing off to the awaiting neonatologist.  The placenta was delivered using manual extraction.  The uterus was exteriorized, wiped clean of clots and debris using two moist laps.  The hysterotomy was closed using a two layer closure of 0 Vicryl, with the first being a running locked, the second a vertical imbricating.  The right tube was grasped in a mid isthmic portion using a Babcock clamp, before being double suture ligated using a 0 chromic wheel.  The intervening nuckel of tube was excised using Metzenbaum scissors.  Complete cross section of tubal ostia visualized, and noted to be hemostatic  This procedure was then repeated in similar fashion for the patient left tube.    The uterus was returned to the abdomen.  The peritoneal gutters were wiped clean of clots and debris using two moist laps.  The hysterotomy incision and tubal  pedicles were re-inspected noted to be hemostatic.  The rectus muscles were inspected noted to be hemostatic.  The superior border of the rectus fascia was grasped with a Kocher clamp.  The ON-Q trocars were then placed 4cm above the superior border of the incision and tunneled  subfascially.  The introducers were removed and the catheters were threaded through the sleeves after which the sleeves were removed.  The fascia was closed using a looped #1 PDS in a running fashion taking 1cm by 1cm bites.  The subcutaneous tissue was irrigated using warm saline, hemostasis achieved using the bovie.  The subcutaneous dead space was less than 3cm and was not closed.  The skin was closed using 4-0 Monocryl in a subcuticular fashion.  Sponge needle and instrument counts were corrects times two.  The patient tolerated the procedure well and was taken to the recovery room in stable condition.

## 2018-08-12 LAB — CBC
HCT: 25.7 % — ABNORMAL LOW (ref 36.0–46.0)
Hemoglobin: 7.9 g/dL — ABNORMAL LOW (ref 12.0–15.0)
MCH: 24.7 pg — ABNORMAL LOW (ref 26.0–34.0)
MCHC: 30.7 g/dL (ref 30.0–36.0)
MCV: 80.3 fL (ref 80.0–100.0)
Platelets: 181 10*3/uL (ref 150–400)
RBC: 3.2 MIL/uL — ABNORMAL LOW (ref 3.87–5.11)
RDW: 27.5 % — ABNORMAL HIGH (ref 11.5–15.5)
WBC: 9.1 10*3/uL (ref 4.0–10.5)
nRBC: 0 % (ref 0.0–0.2)

## 2018-08-12 LAB — SURGICAL PATHOLOGY

## 2018-08-12 LAB — RPR: RPR Ser Ql: NONREACTIVE

## 2018-08-12 NOTE — Progress Notes (Signed)
  Subjective:   Post Op Day 1: Patient is doing well. She is tolerating regular diet and her pain is well controlled with PO pain medication and On Q pump. She is ambulating and voiding without difficulty.   Objective:  Blood pressure 104/64, pulse 80, temperature 98.2 F (36.8 C), temperature source Oral, resp. rate 20, height 5\' 6"  (1.676 m), weight 89.8 kg, last menstrual period 11/18/2017, SpO2 99 %  General: NAD Pulmonary: no increased work of breathing Abdomen: non-distended, non-tender, fundus firm at level of umbilicus Incision: 7 cm x 3 cm area of sanguinous drainage on honeycomb dressing, does not appear to be active. Patient to get up to shower and RN to reassess dressing at that time. Will change dressing. On Q pump is intact. Extremities: no edema, no erythema, no tenderness  Results for orders placed or performed during the hospital encounter of 08/11/18 (from the past 24 hour(s))  CBC     Status: Abnormal   Collection Time: 08/12/18  3:25 AM  Result Value Ref Range   WBC 9.1 4.0 - 10.5 K/uL   RBC 3.20 (L) 3.87 - 5.11 MIL/uL   Hemoglobin 7.9 (L) 12.0 - 15.0 g/dL   HCT 00.3 (L) 49.1 - 79.1 %   MCV 80.3 80.0 - 100.0 fL   MCH 24.7 (L) 26.0 - 34.0 pg   MCHC 30.7 30.0 - 36.0 g/dL   RDW 50.5 (H) 69.7 - 94.8 %   Platelets 181 150 - 400 K/uL   nRBC 0.0 0.0 - 0.2 %    Intake/Output Summary (Last 24 hours) at 08/12/2018 1002 Last data filed at 08/12/2018 0130 Gross per 24 hour  Intake -  Output 665 ml  Net -665 ml      Assessment:   23 y.o. [id number] postoperativeday # 1   Plan:  1) Acute blood loss anemia - hemodynamically stable and asymptomatic - po ferrous sulfate  2) A positive, Rubella Immune, Varicella Immune  3) TDAP status: given antepartum   4) Formula/Contraception: BTL  5) Ambulation encouraged  6) Disposition: Discharge to home likely tomorrow   Parke Poisson, CNM [facility] 08/12/2018, 10:05 AM

## 2018-08-12 NOTE — Anesthesia Post-op Follow-up Note (Signed)
  Anesthesia Pain Follow-up Note  Patient: Hailey Martin  Day #: 1  Date of Follow-up: 08/12/2018 Time: 2:49 PM  Last Vitals:  Vitals:   08/12/18 0701 08/12/18 0852  BP:  104/64  Pulse:  80  Resp:  20  Temp:  36.8 C  SpO2: 100% 99%    Level of Consciousness: alert  Pain: none   Side Effects:None  Catheter Site Exam:clean, dry, no drainage     Plan: D/C from anesthesia care at surgeon's request  Karoline Caldwell

## 2018-08-12 NOTE — Anesthesia Postprocedure Evaluation (Signed)
Anesthesia Post Note  Patient: Hailey Martin  Procedure(s) Performed: CESAREAN SECTION WITH TUBAL LIGATION (N/A )  Patient location during evaluation: Mother Baby Anesthesia Type: Spinal Level of consciousness: oriented and awake and alert Pain management: pain level controlled Vital Signs Assessment: post-procedure vital signs reviewed and stable Respiratory status: spontaneous breathing and respiratory function stable Cardiovascular status: blood pressure returned to baseline and stable Postop Assessment: no headache, no backache, no apparent nausea or vomiting and able to ambulate Anesthetic complications: no     Last Vitals:  Vitals:   08/12/18 0701 08/12/18 0852  BP:  104/64  Pulse:  80  Resp:  20  Temp:  36.8 C  SpO2: 100% 99%    Last Pain:  Vitals:   08/12/18 0930  TempSrc:   PainSc: 0-No pain                 Katrina Brosh Lawerance Cruel

## 2018-08-13 MED ORDER — SIMETHICONE 80 MG PO CHEW: 80.0000 mg | CHEWABLE_TABLET | ORAL | 0 refills | Status: DC

## 2018-08-13 MED ORDER — IBUPROFEN 800 MG PO TABS: 800.0000 mg | ORAL_TABLET | Freq: Three times a day (TID) | ORAL | 0 refills | Status: DC

## 2018-08-13 MED ORDER — OXYCODONE-ACETAMINOPHEN 5-325 MG PO TABS: 1.0000 | ORAL_TABLET | ORAL | 0 refills | Status: DC | PRN

## 2018-08-13 NOTE — Discharge Instructions (Signed)
Discharge Instructions:   If there are any new medications, they have been ordered and will be available for pickup at the listed pharmacy on your way home from the hospital.   Call office if you have any of the following: headache, visual changes, fever >101.0 F, chills, shortness of breath, breast concerns, excessive vaginal bleeding, incision drainage or problems, leg pain or redness, depression or any other concerns. If you have vaginal discharge with an odor, let your doctor know.   It is normal to bleed for up to 6 weeks. You should not soak through more than 1 pad in 1 hour. If you have a blood clot larger than your fist with continued bleeding, call your doctor.   After a c-section, you should expect a small amount of blood or clear fluid coming from the incision and abdominal cramping/soreness. Inspect your incision site daily. Stand in front of a mirror to look for any redness, incision opening, or discolored/odorness drainage. Take a shower daily and continue good hygiene. Use own towel and washcloth (do not share). Make sure your sheets on your bed are clean. No pets sleeping around your incision site. Dressing will be removed at your postpartum visit. If the dressing does become wet or soiled underneath, it is okay to remove it.   On-Q pump: You will remove on day 5 after insertion or if the ball becomes flat before day 5. You will remove on: Jul 26, 2018  Activity: Do not lift > 10 lbs for 6 weeks (do not lift anything heavier than your baby). No intercourse, tampons, swimming pools, hot tubs, baths (only showers) for 6 weeks.  No driving for 1-2 weeks. Continue prenatal vitamin, especially if breastfeeding. Increase calories and fluids (water) while breastfeeding.   Your milk will come in, in the next couple of days (right now it is colostrum). You may have a slight fever when your milk comes in, but it should go away on its own.  If it does not, and rises above 101 F please call the  doctor. You will also feel achy and your breasts will be firm. They will also start to leak. If you are breastfeeding, continue as you have been and you can pump/express milk for comfort.   If you have too much milk, your breasts can become engorged, which could lead to mastitis. This is an infection of the milk ducts. It can be very painful and you will need to notify your doctor to obtain a prescription for antibiotics. You can also treat it with a shower or hot/cold compress.   For concerns about your baby, please call your pediatrician.  For breastfeeding concerns, the lactation consultant can be reached at [phone].   Postpartum blues (feelings of happy one minute and sad another minute) are normal for the first few weeks but if it gets worse let your doctor know.   Congratulations! We enjoyed caring for you and your new bundle of joy!  

## 2018-08-13 NOTE — Clinical Social Work Maternal (Signed)
CLINICAL SOCIAL WORK MATERNAL/CHILD NOTE  Patient Details  Name: Hailey Martin MRN: [id number] Date of Birth: 08-28-1995  Date:  08/13/2018  Clinical Social Worker Initiating Note:  Nunzio Cory Toran Murch Date/Time: Initiated:  08/13/18/1100     Child's Name:  [name]   Biological Parents:  Mother, Father   Need for Interpreter:  None   Reason for Referral:  Current Substance Use/Substance Use During Pregnancy , Late or No Prenatal Care    Address:  [place] [address]    Phone number:  [phone] (home)     Additional phone number:   Household Members/Support Persons (HM/SP):       HM/SP Name Relationship DOB or Age  HM/SP -1        HM/SP -2        HM/SP -3        HM/SP -4        HM/SP -5        HM/SP -6        HM/SP -7        HM/SP -8          Natural Supports (not living in the home):  Immediate Family, Spouse/significant other   Professional Supports: None   Employment: Part-time   Type of Work:     Education:  [place] arranged:    Museum/gallery curator Resources:  [employer]   Other Resources:  Physicist, medical , [place] Considerations Which May Impact Care:  none  Strengths:  Home prepared for child , Engineer, materials, Ability to meet basic needs    Psychotropic Medications:         Pediatrician:    [employer]  Pediatrician List:   Arrey Other([facility])  [facility]      Pediatrician Fax Number:    Risk Factors/Current Problems:  Substance Use    Cognitive State:  Alert , Able to Concentrate    Mood/Affect:  Calm , Happy    CSW Assessment: Met with pt today to assess. Pt alert and oriented x4 and easily engages in conversation. This is pt's third baby. Discussed marijuana use during pregnancy and baby's negative drug test at birth. Pt offers explanation for marijuana use as pain relief. Pt states  that she does not plan to use marijuana going forward as she feels the pain she was using it for was related to pain related to being pregnant after complicated healing from previous cesection. Encouraged pt to discontinue marijuana use especially in front of the children or when she is responsible for attending to their needs. Pt reports that she doesn't plan to use.  Discussed late prenatal care with pt who only states that she didn't feel like it was necessary since she had been pregnant twice before. Encouraged pt to schedule with an OB right away if she becomes pregnant in the future. Pt states she had her "tubes tied" during this admission so she is not anticipating having any other children.   Pt states that father of the baby is supportive and involved. He does not live in the home. Pt states that her mother and pt's best friend are her main support people other than baby's father. Pt has an appointment for the baby at [facility] tomorrow. She plans to establish baby with Dr. Eulogio Bear who is the pediatrician  for her other two children.   Per pt, she will return to her part time job at six weeks postpartum. Discussed signs and symptoms of post partum depression and anxiety with pt. Pt states that she did experience some post partum anxiety with her other children but that she did not seek any treatment for it. Encouraged pt to turn to family and friends for support but to call her OB if she notices that she is having trouble coping. Pt indicates she will do this.   Pt was speaking to [facility] representative on the phone when LCSW arrived in the room and pt's mother is going to pick up formula for the baby before pt comes home later today.   Pt has carseat, crib, and all other needs for baby already in the home. She denies any needs for dc.  CSW Plan/Description:  No Further Intervention Required/No Barriers to Discharge    Shade Flood, LCSW 08/13/2018, 11:32 AM

## 2018-08-13 NOTE — Progress Notes (Signed)
Patient discharged home with infant. Discharge instructions and prescriptions given and reviewed with patient. Patient verbalized understanding. Escorted out by auxillary.  

## 2018-08-19 ENCOUNTER — Encounter: Payer: Self-pay | Admitting: Obstetrics and Gynecology

## 2018-08-19 ENCOUNTER — Ambulatory Visit (INDEPENDENT_AMBULATORY_CARE_PROVIDER_SITE_OTHER): Payer: Medicaid Other | Admitting: Obstetrics and Gynecology

## 2018-08-19 ENCOUNTER — Other Ambulatory Visit: Payer: Self-pay

## 2018-08-19 VITALS — BP 138/76 | HR 65 | Wt 189.0 lb

## 2018-08-19 DIAGNOSIS — Z4889 Encounter for other specified surgical aftercare: Secondary | ICD-10-CM

## 2018-08-19 DIAGNOSIS — O902 Hematoma of obstetric wound: Secondary | ICD-10-CM

## 2018-08-19 MED ORDER — CEPHALEXIN 500 MG PO CAPS: 500.0000 mg | ORAL_CAPSULE | Freq: Three times a day (TID) | ORAL | 0 refills | Status: AC

## 2018-08-19 NOTE — Progress Notes (Signed)
Postoperative Follow-up Patient presents post op from RLTCS & BTL 1weeks ago for history of prior cesaran section and undesired fertility.  Subjective: Patient reports some improvement in her preop symptoms. Eating a regular diet without difficulty. Pain is controlled without any medications.  Activity: normal activities of daily living.  She states that she has noted some bleeding from the incision since discharge, there appear to be area of the incision which are opening.  No pain, no fevers, no chills.    Objective: Blood pressure 138/76, pulse 65, weight 189 lb (85.7 kg), not currently breastfeeding.  General: NAD Pulmonary: no increased work of breathing Abdomen: soft, non-tender, non-distended, incision with surrounding ecchymoses.  No induration or tenderness.  There are three area which appear to have opened on the incision, with underlying hematoma visible.  The incision was opened and clot evacuated using Q-tips and 4x4.  The incision was irrigated with saline and a wet to dry Kurlex dressing was applied before dressing with a ABD pad.  There was no purulent material or odor to the incision.   Extremities: no edema Neurologic: normal gait    Admission on 08/11/2018, Discharged on 08/13/2018  Component Date Value Ref Range Status  . ABO/RH(D) 08/11/2018 A POS   Final  . Antibody Screen 08/11/2018 NEG   Final  . Sample Expiration 08/11/2018    Final                   Value:08/14/2018,2359 Performed at [facility], [address] [place], [place] [address]   . WBC 08/11/2018 9.5  4.0 - 10.5 K/uL Final  . RBC 08/11/2018 4.01  3.87 - 5.11 MIL/uL Final  . Hemoglobin 08/11/2018 10.1* 12.0 - 15.0 g/dL Final  . HCT 60/45/409805/19/2020 31.7* 36.0 - 46.0 % Final  . MCV 08/11/2018 79.1* 80.0 - 100.0 fL Final  . MCH 08/11/2018 25.2* 26.0 - 34.0 pg Final  . MCHC 08/11/2018 31.9  30.0 - 36.0 g/dL Final  . RDW 11/91/478205/19/2020 27.5* 11.5 - 15.5 % Final  . Platelets 08/11/2018 204   150 - 400 K/uL Final  . nRBC 08/11/2018 0.0  0.0 - 0.2 % Final   Performed at [facility], [address] [place], [place] [address]  . RPR Ser Ql 08/11/2018 Non Reactive  Non Reactive Final   Comment: (NOTE) Performed At: [facility] [address] [place], [place] [id number] Jolene SchimkeNagendra Sanjai MD NG:[id number]:[phone]   . SURGICAL PATHOLOGY 08/11/2018    Final                   Value:Surgical Pathology CASE: [id number] PATIENT: Hailey Martin Surgical Pathology Report     SPECIMEN SUBMITTED: A. Tube, right B. Tube, left  CLINICAL HISTORY: None provided  PRE-OPERATIVE DIAGNOSIS: Prior cesarean, undesired fertility  POST-OPERATIVE DIAGNOSIS: Same as pre-op     DIAGNOSIS: A. FALLOPIAN TUBE SEGMENT, RIGHT; STERILIZATION: - FALLOPIAN TUBE WITH NO SIGNIFICANT HISTOPATHOLOGIC CHANGE, SEEN ON FULL CROSS SECTION X2.  B. FALLOPIAN TUBE SEGMENT, LEFT; STERILIZATION: - FALLOPIAN TUBE WITH NO SIGNIFICANT HISTOPATHOLOGIC CHANGE, SEEN ON FULL CROSS SECTION X2.  GROSS DESCRIPTION: A. Labeled: Right tube Received: Formalin Tissue fragment(s): 1 Size: 1.8 cm in length by 0.5 cm in diameter Description: Received is an irregular, tubular shaped fragment of tan-pink soft tissue (grossly consistent with portion of fallopian tube).  Fimbria are absent.  No abnormalities are grossly identified. Representative cross sections are submitted in cassett  e 1.  B. Labeled: Left tube Received: Formalin Tissue fragment(s): 1 Size: 2.8 cm in length by 0.7 cm in diameter Description: Received is an irregular, tubular shaped fragment of tan-pink soft tissue (grossly consistent with portion of fallopian tube).  Fimbria are absent.  No abnormalities are grossly identified. Representative cross sections are submitted in cassette 1.   Final Diagnosis performed by Katherine Mantle, MD.   Electronically signed 08/12/2018 9:27:13AM The electronic signature  indicates that the named Attending Pathologist has evaluated the specimen  Technical component performed at [facility], [address] [place], [place] [address] Lab: [id number] Dir: Jolene Schimke, MD, MMM  Professional component performed at [facility], [address] [place], [place], [place] [address] Lab: [phone] Dir: Georgiann Cocker. Rubinas, MD   . Gonorrhea 07/27/2018 Negative   Final  . Varicella 06/05/2018 Immune   Final  . WBC 08/12/2018 9.1  4.0 - 10.5 K/uL Final  . RBC 08/12/2018 3.20* 3.87 - 5.11 MIL/uL Final  . Hemoglobin 08/12/2018 7.9* 12.0 - 15.0 g/dL Final  . HCT 01/00/7121 25.7* 36.0 - 46.0 % Final  . MCV 08/12/2018 80.3  80.0 - 100.0 fL Final  . MCH 08/12/2018 24.7* 26.0 - 34.0 pg Final  . MCHC 08/12/2018 30.7  30.0 - 36.0 g/dL Final  . RDW 97/58/8325 27.5* 11.5 - 15.5 % Final  . Platelets 08/12/2018 181  150 - 400 K/uL Final  . nRBC 08/12/2018 0.0  0.0 - 0.2 % Final   Performed at [facility], [address] [place], [place] [address]    Assessment: 23 y.o. s/p RLTCS & BTL with wound hematoma   Plan: Patient has done well after surgery with no apparent complications.  I have discussed the post-operative course to date, and the expected progress moving forward.  The patient understands what complications to be concerned about.  I will see the patient in routine follow up, or sooner if needed.    Activity plan: No heavy lifting.  Incision was opened up, irrigated with sterile saline, and packed with wet to dry dressing.  Return to clinic tomorrow for re-packing.  If stable continue wet to dry vs wound vac.    Will prophylactic ally start on Keflex.  No signs of infection at present but given open incision and blood being a good culture medium will cover.   Vena Austria, MD, Ever[facility], [facility] 08/19/2018, 2:27 PM

## 2018-08-20 ENCOUNTER — Ambulatory Visit (INDEPENDENT_AMBULATORY_CARE_PROVIDER_SITE_OTHER): Payer: Medicaid Other | Admitting: Obstetrics and Gynecology

## 2018-08-20 ENCOUNTER — Other Ambulatory Visit: Payer: Self-pay

## 2018-08-20 DIAGNOSIS — T8130XA Disruption of wound, unspecified, initial encounter: Secondary | ICD-10-CM

## 2018-08-20 DIAGNOSIS — O902 Hematoma of obstetric wound: Secondary | ICD-10-CM

## 2018-08-20 DIAGNOSIS — Z9889 Other specified postprocedural states: Secondary | ICD-10-CM

## 2018-08-20 NOTE — Progress Notes (Signed)
Postoperative Follow-up Patient presents post op from RLTCS & BTL 2weeks ago.  Subjective: Patient reports some improvement in her preop symptoms. Eating a regular diet without difficulty. Pain is controlled without any medications.  Activity: normal activities of daily living.  Objective: not currently breastfeeding.  General: NAD Pulmonary: no increased work of breathing Abdomen: soft, non-tender, non-distended, previously placed wet to dry dressing removed.  Incision irrigated with sterile saline.  Some additional clot removed from left aspect subcutanously using saline and sponge forceps.  10cm wound open to fascia, tracks 1cm subcutaneously on patient right, 2.5cm on patient's left.  Depth 2.5cm.  No odor, no purulent drainge, no induration.  Tolerated change well Extremities: no edema Neurologic: normal gait    Admission on 08/11/2018, Discharged on 08/13/2018  Component Date Value Ref Range Status  . ABO/RH(D) 08/11/2018 A POS   Final  . Antibody Screen 08/11/2018 NEG   Final  . Sample Expiration 08/11/2018    Final                   Value:08/14/2018,2359 Performed at [facility], [address] [place], [place] [address]   . WBC 08/11/2018 9.5  4.0 - 10.5 K/uL Final  . RBC 08/11/2018 4.01  3.87 - 5.11 MIL/uL Final  . Hemoglobin 08/11/2018 10.1* 12.0 - 15.0 g/dL Final  . HCT 19/14/782905/19/2020 31.7* 36.0 - 46.0 % Final  . MCV 08/11/2018 79.1* 80.0 - 100.0 fL Final  . MCH 08/11/2018 25.2* 26.0 - 34.0 pg Final  . MCHC 08/11/2018 31.9  30.0 - 36.0 g/dL Final  . RDW 56/21/308605/19/2020 27.5* 11.5 - 15.5 % Final  . Platelets 08/11/2018 204  150 - 400 K/uL Final  . nRBC 08/11/2018 0.0  0.0 - 0.2 % Final   Performed at [facility], [address] [place], [place] [address]  . RPR Ser Ql 08/11/2018 Non Reactive  Non Reactive Final   Comment: (NOTE) Performed At: [facility] [address] [place], [place] [id number] Jolene SchimkeNagendra Sanjai MD LK:[phone]    . SURGICAL PATHOLOGY 08/11/2018    Final                   Value:Surgical Pathology CASE: [id number] PATIENT: Cindra Dues Surgical Pathology Report     SPECIMEN SUBMITTED: A. Tube, right B. Tube, left  CLINICAL HISTORY: None provided  PRE-OPERATIVE DIAGNOSIS: Prior cesarean, undesired fertility  POST-OPERATIVE DIAGNOSIS: Same as pre-op     DIAGNOSIS: A. FALLOPIAN TUBE SEGMENT, RIGHT; STERILIZATION: - FALLOPIAN TUBE WITH NO SIGNIFICANT HISTOPATHOLOGIC CHANGE, SEEN ON FULL CROSS SECTION X2.  B. FALLOPIAN TUBE SEGMENT, LEFT; STERILIZATION: - FALLOPIAN TUBE WITH NO SIGNIFICANT HISTOPATHOLOGIC CHANGE, SEEN ON FULL CROSS SECTION X2.  GROSS DESCRIPTION: A. Labeled: Right tube Received: Formalin Tissue fragment(s): 1 Size: 1.8 cm in length by 0.5 cm in diameter Description: Received is an irregular, tubular shaped fragment of tan-pink soft tissue (grossly consistent with portion of fallopian tube).  Fimbria are absent.  No abnormalities are grossly identified. Representative cross sections are submitted in cassett                         e 1.  B. Labeled: Left tube Received: Formalin Tissue fragment(s): 1 Size: 2.8 cm in length by 0.7 cm in diameter Description: Received is an irregular, tubular shaped fragment of tan-pink soft tissue (grossly consistent with portion of fallopian tube).  Fimbria are absent.  No abnormalities are grossly identified.  Representative cross sections are submitted in cassette 1.   Final Diagnosis performed by Katherine Mantle, MD.   Electronically signed 08/12/2018 9:27:13AM The electronic signature indicates that the named Attending Pathologist has evaluated the specimen  Technical component performed at [facility], [address], [place], [place] [address] Lab: [phone] Dir: Jolene Schimke, MD, MMM  Professional component performed at [facility], [facility], [address], [place], [place] [address] Lab: [phone]  Dir: Georgiann Cocker. Rubinas, MD   . Gonorrhea 07/27/2018 Negative   Final  . Varicella 06/05/2018 Immune   Final  . WBC 08/12/2018 9.1  4.0 - 10.5 K/uL Final  . RBC 08/12/2018 3.20* 3.87 - 5.11 MIL/uL Final  . Hemoglobin 08/12/2018 7.9* 12.0 - 15.0 g/dL Final  . HCT 01/77/9390 25.7* 36.0 - 46.0 % Final  . MCV 08/12/2018 80.3  80.0 - 100.0 fL Final  . MCH 08/12/2018 24.7* 26.0 - 34.0 pg Final  . MCHC 08/12/2018 30.7  30.0 - 36.0 g/dL Final  . RDW 30/11/2328 27.5* 11.5 - 15.5 % Final  . Platelets 08/12/2018 181  150 - 400 K/uL Final  . nRBC 08/12/2018 0.0  0.0 - 0.2 % Final   Performed at [facility], [address] [place], [place] [address]    Assessment: 23 y.o. s/p RLTCS & BTL with postop hematoma and wound dehiscence stable  Plan: Patient has done well after surgery with no apparent complications.  I have discussed the post-operative course to date, and the expected progress moving forward.  The patient understands what complications to be concerned about.  I will see the patient in routine follow up, or sooner if needed.    Activity plan: No restriction.  - arrange home health - continue wet to dry dressing changes once daily - is candidate for wound vac therapy as well   Vena Austria, MD, [facility], [facility] 08/20/2018, 4:06 PM

## 2018-08-21 ENCOUNTER — Other Ambulatory Visit: Payer: Self-pay

## 2018-08-21 ENCOUNTER — Ambulatory Visit: Payer: Self-pay

## 2018-08-21 ENCOUNTER — Telehealth: Payer: Self-pay | Admitting: Obstetrics & Gynecology

## 2018-08-21 ENCOUNTER — Ambulatory Visit: Payer: Medicaid Other | Admitting: Obstetrics & Gynecology

## 2018-08-21 ENCOUNTER — Encounter: Payer: Self-pay | Admitting: Obstetrics & Gynecology

## 2018-08-21 ENCOUNTER — Inpatient Hospital Stay: Payer: Medicaid Other | Admitting: Oncology

## 2018-08-21 ENCOUNTER — Ambulatory Visit (INDEPENDENT_AMBULATORY_CARE_PROVIDER_SITE_OTHER): Payer: Medicaid Other | Admitting: Obstetrics & Gynecology

## 2018-08-21 VITALS — BP 110/70 | Ht 66.0 in | Wt 186.0 lb

## 2018-08-21 DIAGNOSIS — T8130XA Disruption of wound, unspecified, initial encounter: Secondary | ICD-10-CM

## 2018-08-21 DIAGNOSIS — O902 Hematoma of obstetric wound: Secondary | ICD-10-CM

## 2018-08-21 NOTE — Telephone Encounter (Signed)
-----   Message from Vena Austria, MD sent at 08/21/2018  3:57 AM EDT ----- Regarding: Wound packing change If unable to have home health packing change today will need to have it visit arranged in clinic for packing change with MD provider   Vena Austria, MD, [facility], [facility] 08/21/2018, 3:58 AM

## 2018-08-21 NOTE — Telephone Encounter (Signed)
Scheduled today @ 4:25pm.

## 2018-08-21 NOTE — Telephone Encounter (Signed)
Patient was schedule to today to have her packing clean. Patient called at 1:30 wanting to cancel appointment due to timing issues. I spoke with Dr. Tiburcio Pea the provider that she was schedule with he advised patient be seen but that if she had issues with timing that we could schedule a later time today.if not patient needs to be seen no later than Monday. I advised per RPh to schedule Monday. Patient wanted to call back to be schedule. Patient is wanting to know the status of Home health nurse. Could you please look into this for the patient?

## 2018-08-21 NOTE — Patient Instructions (Signed)
Labor and Delivery [phone]    Ask for Dr Jean Rosenthal    If any incisional/ wound concerns Keep clean and dry

## 2018-08-21 NOTE — Telephone Encounter (Signed)
Patient is schedule 08/21/18 with RPH °

## 2018-08-21 NOTE — Progress Notes (Signed)
The cesarean wound is cleansed, debrided of foreign material as much as possible, and dressed.  Wet to dry w saline gauze used to pack the cesarean subQ incision.  Fascia intact.  No erythema or purulence or necrotic tissue. The patient is alerted to watch for any signs of infection (redness, pus, pain, increased swelling or fever) and call if such occurs. Home wound care instructions are provided. Tetanus vaccination status reviewed: last tetanus booster within 10 years.  Follow up appt Monday.  Instructions to call and even be seen by On Call MD over next 2 days if needed.  Home health to be arranged but has not come through yet.  Annamarie Major, MD, [facility], [facility] 08/21/2018  4:53 PM

## 2018-08-24 ENCOUNTER — Ambulatory Visit (INDEPENDENT_AMBULATORY_CARE_PROVIDER_SITE_OTHER): Payer: Medicaid Other | Admitting: Obstetrics and Gynecology

## 2018-08-24 ENCOUNTER — Telehealth: Payer: Self-pay | Admitting: Obstetrics and Gynecology

## 2018-08-24 ENCOUNTER — Encounter: Payer: Self-pay | Admitting: Obstetrics and Gynecology

## 2018-08-24 ENCOUNTER — Other Ambulatory Visit: Payer: Self-pay

## 2018-08-24 ENCOUNTER — Other Ambulatory Visit: Payer: Self-pay | Admitting: Obstetrics and Gynecology

## 2018-08-24 VITALS — BP 130/68 | HR 87 | Temp 98.2°F | Wt 181.0 lb

## 2018-08-24 DIAGNOSIS — O902 Hematoma of obstetric wound: Secondary | ICD-10-CM

## 2018-08-24 DIAGNOSIS — Z9889 Other specified postprocedural states: Secondary | ICD-10-CM

## 2018-08-24 MED ORDER — AMBULATORY NON FORMULARY MEDICATION: 99 refills | Status: DC

## 2018-08-24 NOTE — Progress Notes (Signed)
Postoperative Follow-up Patient presents post op from RLTCS & BTL 2weeks ago for requested sterilization and history of prior C-sectionj.  Subjective: Patient reports marked improvement in her preop symptoms. Eating a regular diet without difficulty. Pain is controlled without any medications.  Activity: normal activities of daily living.  Objective: Blood pressure 130/68, pulse 87, temperature 98.2 F (36.8 C), weight 181 lb (82.1 kg), not currently breastfeeding.  General: NAD Pulmonary: no increased work of breathing Abdomen: soft, non-tender, non-distended, incision without odor, purulent discharge, erythema, or ecchymoses.  Healthy granulation tissue.  The left and right aspect of the incision which had some subcutaneous tunneling previously have granulated closed.  New wet to dry dressing applied Extremities: no edema Neurologic: normal gait    Admission on 08/11/2018, Discharged on 08/13/2018  Component Date Value Ref Range Status  . ABO/RH(D) 08/11/2018 A POS   Final  . Antibody Screen 08/11/2018 NEG   Final  . Sample Expiration 08/11/2018    Final                   Value:08/14/2018,2359 Performed at [facility], [address] [place], [place] [address]   . WBC 08/11/2018 9.5  4.0 - 10.5 K/uL Final  . RBC 08/11/2018 4.01  3.87 - 5.11 MIL/uL Final  . Hemoglobin 08/11/2018 10.1* 12.0 - 15.0 g/dL Final  . HCT 60/45/409805/19/2020 31.7* 36.0 - 46.0 % Final  . MCV 08/11/2018 79.1* 80.0 - 100.0 fL Final  . MCH 08/11/2018 25.2* 26.0 - 34.0 pg Final  . MCHC 08/11/2018 31.9  30.0 - 36.0 g/dL Final  . RDW 11/91/478205/19/2020 27.5* 11.5 - 15.5 % Final  . Platelets 08/11/2018 204  150 - 400 K/uL Final  . nRBC 08/11/2018 0.0  0.0 - 0.2 % Final   Performed at [facility], [address] [place], [place] [address]  . RPR Ser Ql 08/11/2018 Non Reactive  Non Reactive Final   Comment: (NOTE) Performed At: [facility] [address] [place], [place] [id number]  Jolene SchimkeNagendra Sanjai MD NG:[id number]:[phone]   . SURGICAL PATHOLOGY 08/11/2018    Final                   Value:Surgical Pathology CASE: [id number] PATIENT: Hailey Martin Surgical Pathology Report     SPECIMEN SUBMITTED: A. Tube, right B. Tube, left  CLINICAL HISTORY: None provided  PRE-OPERATIVE DIAGNOSIS: Prior cesarean, undesired fertility  POST-OPERATIVE DIAGNOSIS: Same as pre-op     DIAGNOSIS: A. FALLOPIAN TUBE SEGMENT, RIGHT; STERILIZATION: - FALLOPIAN TUBE WITH NO SIGNIFICANT HISTOPATHOLOGIC CHANGE, SEEN ON FULL CROSS SECTION X2.  B. FALLOPIAN TUBE SEGMENT, LEFT; STERILIZATION: - FALLOPIAN TUBE WITH NO SIGNIFICANT HISTOPATHOLOGIC CHANGE, SEEN ON FULL CROSS SECTION X2.  GROSS DESCRIPTION: A. Labeled: Right tube Received: Formalin Tissue fragment(s): 1 Size: 1.8 cm in length by 0.5 cm in diameter Description: Received is an irregular, tubular shaped fragment of tan-pink soft tissue (grossly consistent with portion of fallopian tube).  Fimbria are absent.  No abnormalities are grossly identified. Representative cross sections are submitted in cassett                         e 1.  B. Labeled: Left tube Received: Formalin Tissue fragment(s): 1 Size: 2.8 cm in length by 0.7 cm in diameter Description: Received is an irregular, tubular shaped fragment of tan-pink soft tissue (grossly consistent with portion of fallopian tube).  Fimbria are absent.  No abnormalities are  grossly identified. Representative cross sections are submitted in cassette 1.   Final Diagnosis performed by Katherine Mantle, MD.   Electronically signed 08/12/2018 9:27:13AM The electronic signature indicates that the named Attending Pathologist has evaluated the specimen  Technical component performed at [facility], [address] [place], [place] [address] Lab: [phone] Dir: Jolene Schimke, MD, MMM  Professional component performed at [facility], [facility], [address] [place],  [place], [place] [address] Lab: [phone] Dir: Georgiann Cocker. Rubinas, MD   . Gonorrhea 07/27/2018 Negative   Final  . Varicella 06/05/2018 Immune   Final  . WBC 08/12/2018 9.1  4.0 - 10.5 K/uL Final  . RBC 08/12/2018 3.20* 3.87 - 5.11 MIL/uL Final  . Hemoglobin 08/12/2018 7.9* 12.0 - 15.0 g/dL Final  . HCT 29/19/1660 25.7* 36.0 - 46.0 % Final  . MCV 08/12/2018 80.3  80.0 - 100.0 fL Final  . MCH 08/12/2018 24.7* 26.0 - 34.0 pg Final  . MCHC 08/12/2018 30.7  30.0 - 36.0 g/dL Final  . RDW 60/06/5995 27.5* 11.5 - 15.5 % Final  . Platelets 08/12/2018 181  150 - 400 K/uL Final  . nRBC 08/12/2018 0.0  0.0 - 0.2 % Final   Performed at [facility], [address] [place], [place] [address]    Assessment: 24 y.o. s/p RLTCS & BTL with postpartum wound hematoma and dehiscence stable  Plan: Patient has done well after surgery with no apparent complications.  I have discussed the post-operative course to date, and the expected progress moving forward.  The patient understands what complications to be concerned about.  I will see the patient in routine follow up, or sooner if needed.    Activity plan: No heavy lifting.  - continue daily wet to dry dressing changes in office until home health in place   Vena Austria, MD, [facility], [facility]

## 2018-08-24 NOTE — Telephone Encounter (Signed)
3.4" or 4.5" Kerlix gauze in the bed of the incision moistened with saline, followed by some Kerlix super sponges or similar products then covered with an ABD pad fastened with paper tape is what I used when I did her dressing change

## 2018-08-24 NOTE — Telephone Encounter (Signed)
Hailey Martin with home health called regarding referral, they need order for wound care supplies needed.

## 2018-08-24 NOTE — Progress Notes (Unsigned)
medi

## 2018-08-24 NOTE — Telephone Encounter (Signed)
Advise

## 2018-08-25 ENCOUNTER — Ambulatory Visit (INDEPENDENT_AMBULATORY_CARE_PROVIDER_SITE_OTHER): Payer: Medicaid Other | Admitting: Obstetrics and Gynecology

## 2018-08-25 ENCOUNTER — Ambulatory Visit: Payer: Medicaid Other | Admitting: Obstetrics and Gynecology

## 2018-08-25 ENCOUNTER — Other Ambulatory Visit: Payer: Self-pay

## 2018-08-25 DIAGNOSIS — O902 Hematoma of obstetric wound: Secondary | ICD-10-CM

## 2018-08-25 NOTE — Progress Notes (Signed)
Postoperative Follow-up Patient presents post op from RLTCS & BTL 2weeks ago for requested sterilization and prior repeat C-section.  Subjective: Patient reports no improvement in her preop symptoms. Eating a regular diet without difficulty. Pain is controlled without any medications.  Activity: normal activities of daily living.  Objective: not currently breastfeeding.  General: NAD Pulmonary: no increased work of breathing Abdomen: soft, non-tender, non-distended, incision granulating in nicely, no signs of infection, wet to dry dressing replaced Extremities: no edema Neurologic: normal gait    Admission on 08/11/2018, Discharged on 08/13/2018  Component Date Value Ref Range Status  . ABO/RH(D) 08/11/2018 A POS   Final  . Antibody Screen 08/11/2018 NEG   Final  . Sample Expiration 08/11/2018    Final                   Value:08/14/2018,2359 Performed at [facility], [address] [place], [place] [address]   . WBC 08/11/2018 9.5  4.0 - 10.5 K/uL Final  . RBC 08/11/2018 4.01  3.87 - 5.11 MIL/uL Final  . Hemoglobin 08/11/2018 10.1* 12.0 - 15.0 g/dL Final  . HCT 60/45/409805/19/2020 31.7* 36.0 - 46.0 % Final  . MCV 08/11/2018 79.1* 80.0 - 100.0 fL Final  . MCH 08/11/2018 25.2* 26.0 - 34.0 pg Final  . MCHC 08/11/2018 31.9  30.0 - 36.0 g/dL Final  . RDW 11/91/478205/19/2020 27.5* 11.5 - 15.5 % Final  . Platelets 08/11/2018 204  150 - 400 K/uL Final  . nRBC 08/11/2018 0.0  0.0 - 0.2 % Final   Performed at [facility], [address] [place], [place] [address]  . RPR Ser Ql 08/11/2018 Non Reactive  Non Reactive Final   Comment: (NOTE) Performed At: [facility] [address] [place], [place] [id number] Jolene SchimkeNagendra Sanjai MD NG:[phone]:[phone]   . SURGICAL PATHOLOGY 08/11/2018    Final                   Value:Surgical Pathology CASE: [id number] PATIENT: Hailey Martin Surgical Pathology Report     SPECIMEN SUBMITTED: A. Tube, right B. Tube, left   CLINICAL HISTORY: None provided  PRE-OPERATIVE DIAGNOSIS: Prior cesarean, undesired fertility  POST-OPERATIVE DIAGNOSIS: Same as pre-op     DIAGNOSIS: A. FALLOPIAN TUBE SEGMENT, RIGHT; STERILIZATION: - FALLOPIAN TUBE WITH NO SIGNIFICANT HISTOPATHOLOGIC CHANGE, SEEN ON FULL CROSS SECTION X2.  B. FALLOPIAN TUBE SEGMENT, LEFT; STERILIZATION: - FALLOPIAN TUBE WITH NO SIGNIFICANT HISTOPATHOLOGIC CHANGE, SEEN ON FULL CROSS SECTION X2.  GROSS DESCRIPTION: A. Labeled: Right tube Received: Formalin Tissue fragment(s): 1 Size: 1.8 cm in length by 0.5 cm in diameter Description: Received is an irregular, tubular shaped fragment of tan-pink soft tissue (grossly consistent with portion of fallopian tube).  Fimbria are absent.  No abnormalities are grossly identified. Representative cross sections are submitted in cassett                         e 1.  B. Labeled: Left tube Received: Formalin Tissue fragment(s): 1 Size: 2.8 cm in length by 0.7 cm in diameter Description: Received is an irregular, tubular shaped fragment of tan-pink soft tissue (grossly consistent with portion of fallopian tube).  Fimbria are absent.  No abnormalities are grossly identified. Representative cross sections are submitted in cassette 1.   Final Diagnosis performed by Katherine MantleHeath Jones, MD.   Electronically signed 08/12/2018 9:27:13AM The electronic signature indicates that the named Attending Pathologist has evaluated the specimen  Technical  component performed at [facility], [address], [place], [place] [address] Lab: [phone] Dir: Jolene Schimke, MD, MMM  Professional component performed at [facility], [facility], [address] [place], [place], [place] [address] Lab: [phone] Dir: Georgiann Cocker. Rubinas, MD   . Gonorrhea 07/27/2018 Negative   Final  . Varicella 06/05/2018 Immune   Final  . WBC 08/12/2018 9.1  4.0 - 10.5 K/uL Final  . RBC 08/12/2018 3.20* 3.87 - 5.11 MIL/uL Final  .  Hemoglobin 08/12/2018 7.9* 12.0 - 15.0 g/dL Final  . HCT 92/33/0076 25.7* 36.0 - 46.0 % Final  . MCV 08/12/2018 80.3  80.0 - 100.0 fL Final  . MCH 08/12/2018 24.7* 26.0 - 34.0 pg Final  . MCHC 08/12/2018 30.7  30.0 - 36.0 g/dL Final  . RDW 22/63/3354 27.5* 11.5 - 15.5 % Final  . Platelets 08/12/2018 181  150 - 400 K/uL Final  . nRBC 08/12/2018 0.0  0.0 - 0.2 % Final   Performed at [facility], [address] [place], [place] [address]    Assessment: 23 y.o. s/p RLTCS & BTL stable  Plan: Patient has done well after surgery with no apparent complications.  I have discussed the post-operative course to date, and the expected progress moving forward.  The patient understands what complications to be concerned about.  I will see the patient in routine follow up, or sooner if needed.    Activity plan: No heavy lifting.   Vena Austria, MD, [facility], [facility] 08/25/2018, 3:46 PM

## 2018-08-26 ENCOUNTER — Ambulatory Visit: Payer: Medicaid Other | Admitting: Obstetrics and Gynecology

## 2018-08-26 ENCOUNTER — Telehealth: Payer: Self-pay

## 2018-08-26 DIAGNOSIS — O900 Disruption of cesarean delivery wound: Secondary | ICD-10-CM | POA: Diagnosis not present

## 2018-08-26 DIAGNOSIS — O902 Hematoma of obstetric wound: Secondary | ICD-10-CM | POA: Diagnosis not present

## 2018-08-26 NOTE — Telephone Encounter (Signed)
Hailey Martin from [facility] [employer], if calling for status of sounded like she said 'ostomy' supplies order for pt.  PH# [phone]  F# [phone]

## 2018-08-26 NOTE — Telephone Encounter (Signed)
Rena from [facility] called stating they do not need an order for wound care  supplies.  The order is for home care starting today.  The nurses will supply the supplies needed.  They do not need anything from [place].  They are hoping to see her today.  Rena apologized for the confusing but is happy they can take care of the pt.

## 2018-08-26 NOTE — Telephone Encounter (Signed)
Didn't get her left voicemail with my cell number

## 2018-08-26 NOTE — Telephone Encounter (Signed)
advise

## 2018-08-26 NOTE — Telephone Encounter (Signed)
Elease Hashimoto, nurse c [facility], calling to get approval for skilled nursing to go out and help c would care.  [phone]

## 2018-08-27 ENCOUNTER — Ambulatory Visit: Payer: Medicaid Other | Admitting: Obstetrics and Gynecology

## 2018-08-27 DIAGNOSIS — O902 Hematoma of obstetric wound: Secondary | ICD-10-CM | POA: Diagnosis not present

## 2018-08-27 DIAGNOSIS — O900 Disruption of cesarean delivery wound: Secondary | ICD-10-CM | POA: Diagnosis not present

## 2018-08-31 DIAGNOSIS — O902 Hematoma of obstetric wound: Secondary | ICD-10-CM | POA: Diagnosis not present

## 2018-08-31 DIAGNOSIS — O900 Disruption of cesarean delivery wound: Secondary | ICD-10-CM | POA: Diagnosis not present

## 2018-09-02 NOTE — Telephone Encounter (Signed)
no

## 2018-09-02 NOTE — Telephone Encounter (Signed)
Attempted to reach patient to f/u. No answer, no v/m.

## 2018-09-03 DIAGNOSIS — O902 Hematoma of obstetric wound: Secondary | ICD-10-CM | POA: Diagnosis not present

## 2018-09-03 DIAGNOSIS — O900 Disruption of cesarean delivery wound: Secondary | ICD-10-CM | POA: Diagnosis not present

## 2018-09-08 DIAGNOSIS — O900 Disruption of cesarean delivery wound: Secondary | ICD-10-CM | POA: Diagnosis not present

## 2018-09-08 DIAGNOSIS — O902 Hematoma of obstetric wound: Secondary | ICD-10-CM | POA: Diagnosis not present

## 2018-09-21 ENCOUNTER — Telehealth: Payer: Self-pay

## 2018-09-21 NOTE — Telephone Encounter (Signed)
Abby c [facility], calling to give AMS a heads up; pt missed visit last week.  Abby thinks something is wrong c pt's phone b/c pt is usually very compliant.  Will try again today.  [phone]

## 2018-09-24 ENCOUNTER — Telehealth: Payer: Self-pay

## 2018-09-24 NOTE — Telephone Encounter (Signed)
[facility] calling to let [facility] know pt missed her appt again this week.

## 2018-09-29 ENCOUNTER — Telehealth: Payer: Self-pay

## 2018-09-29 NOTE — Telephone Encounter (Signed)
Abby from [facility] calling; is UTR pt for 3wks; heard phone may have changed.  Do we have updated #?  If not, Abby will unfortunately have to d/c pt. [phone]  Pt's phone # given. Pt and Kelby Fam broke up per Abby (he cussed her out for calling him); they do not do email.

## 2018-09-30 DIAGNOSIS — O900 Disruption of cesarean delivery wound: Secondary | ICD-10-CM | POA: Diagnosis not present

## 2018-09-30 DIAGNOSIS — O902 Hematoma of obstetric wound: Secondary | ICD-10-CM | POA: Diagnosis not present

## 2018-10-05 DIAGNOSIS — O902 Hematoma of obstetric wound: Secondary | ICD-10-CM | POA: Diagnosis not present

## 2018-10-05 DIAGNOSIS — O900 Disruption of cesarean delivery wound: Secondary | ICD-10-CM | POA: Diagnosis not present

## 2018-11-06 NOTE — Progress Notes (Deleted)
[facility]  Telephone:(336) [phone] [phone]) [phone]  ID: Hailey Martin OB: 06/19/1995  MR#: [id number]  AOZ#:[id number]#:[id number]  Patient Care Team: Patient, No Pcp Per as PCP - General (General Practice)  CHIEF COMPLAINT: Iron deficiency anemia.  INTERVAL HISTORY: Patient was last evaluated in clinic in September 2018.  She is referred back once again for having significant iron deficiency anemia in her third trimester pregnancy.  She currently feels well and is asymptomatic.  She is gaining weight appropriately.  She has no neurologic complaints.  She denies any recent fevers or illnesses.  She has no chest pain or shortness of breath.  She denies any nausea, vomiting, constipation, or diarrhea.  She has no melena or hematochezia.  She has no urinary complaints.  Patient feels at her baseline offers no specific complaints today.    REVIEW OF SYSTEMS:   Review of Systems  Constitutional: Negative.  Negative for fever, malaise/fatigue and weight loss.  Respiratory: Negative.  Negative for cough, hemoptysis and shortness of breath.   Cardiovascular: Negative.  Negative for chest pain and leg swelling.  Gastrointestinal: Negative.  Negative for abdominal pain, blood in stool and melena.  Genitourinary: Negative.  Negative for hematuria.  Musculoskeletal: Negative.   Skin: Negative.  Negative for rash.  Neurological: Negative.  Negative for dizziness, focal weakness, weakness and headaches.  Psychiatric/Behavioral: Negative.  The patient is not nervous/anxious.     As per HPI. Otherwise, a complete review of systems is negative.  PAST MEDICAL HISTORY: Past Medical History:  Diagnosis Date  . Allergy   . Anemia   . Dysmenorrhea   . Overweight   . Seborrhea     PAST SURGICAL HISTORY: Past Surgical History:  Procedure Laterality Date  . CESAREAN SECTION N/A 08/29/2015   Procedure: CESAREAN SECTION;  Surgeon: Vena AustriaAndreas Staebler, MD;  Location: [facility] ORS;  Service:  Obstetrics;  Laterality: N/A;  . CESAREAN SECTION N/A 01/28/2017   Procedure: CESAREAN SECTION;  Surgeon: Conard NovakJackson, Stephen D, MD;  Location: [facility] ORS;  Service: Obstetrics;  Laterality: N/A;  . CESAREAN SECTION WITH BILATERAL TUBAL LIGATION N/A 08/11/2018   Procedure: CESAREAN SECTION WITH TUBAL LIGATION;  Surgeon: Vena AustriaStaebler, Andreas, MD;  Location: [facility] ORS;  Service: Obstetrics;  Laterality: N/A;  . WISDOM TOOTH EXTRACTION      FAMILY HISTORY: Family History  Problem Relation Age of Onset  . Heart failure Father 4535  . Breast cancer Paternal Grandmother   . Diabetes Paternal Grandmother   . Hypertension Paternal Grandmother   . Colon cancer Paternal Grandfather   . Hypertension Paternal Grandfather     ADVANCED DIRECTIVES (Y/N):  N  HEALTH MAINTENANCE: Social History   Tobacco Use  . Smoking status: Never Smoker  . Smokeless tobacco: Never Used  Substance Use Topics  . Alcohol use: No    Alcohol/week: 0.0 standard drinks  . Drug use: No     Colonoscopy:  PAP:  Bone density:  Lipid panel:  No Known Allergies  Current Outpatient Medications  Medication Sig Dispense Refill  . AMBULATORY NON FORMULARY MEDICATION Kerlix gauze HCPCS [id number] in the bed of the incision moistened with saline HCPCS A4218, followed by some Kerlix super sponges or similar products HCPCS [phone] then covered with an ABD pad HCPCS A6217 fastened with paper tape HCCPS A4450 30 Units prn  . ibuprofen (ADVIL) 800 MG tablet Take 1 tablet (800 mg total) by mouth every 8 (eight) hours. 30 tablet 0  . oxyCODONE-acetaminophen (PERCOCET/ROXICET) 5-325 MG tablet Take 1 tablet  by mouth every 4 (four) hours as needed (breakthrough pain). 30 tablet 0  . Prenatal Vit-Fe Fumarate-FA (PRENATAL MULTIVITAMIN) TABS tablet Take 1 tablet by mouth daily at 12 noon.    . simethicone (MYLICON) 80 MG chewable tablet Chew 1 tablet (80 mg total) by mouth daily. 30 tablet 0   No current facility-administered medications for this  visit.     OBJECTIVE: There were no vitals filed for this visit.   There is no height or weight on file to calculate BMI.    ECOG FS:0 - Asymptomatic  General: Well-developed, well-nourished, no acute distress. Eyes: Pink conjunctiva, anicteric sclera. HEENT: Normocephalic, moist mucous membranes, clear oropharnyx. Lungs: Clear to auscultation bilaterally. Heart: Regular rate and rhythm. No rubs, murmurs, or gallops. Abdomen: Appears appropriate for gestational age.   Musculoskeletal: No edema, cyanosis, or clubbing. Neuro: Alert, answering all questions appropriately. Cranial nerves grossly intact. Skin: No rashes or petechiae noted. Psych: Normal affect. Lymphatics: No cervical, calvicular, axillary or inguinal LAD.  LAB RESULTS:  Lab Results  Component Value Date   NA 136 11/19/2016   K 3.4 (L) 11/19/2016   CL 105 11/19/2016   CO2 23 11/19/2016   GLUCOSE 81 11/19/2016   BUN 9 11/19/2016   CREATININE <0.30 (L) 11/19/2016   CALCIUM 8.7 (L) 11/19/2016   PROT 7.2 11/19/2016   ALBUMIN 3.2 (L) 11/19/2016   AST 21 11/19/2016   ALT 13 (L) 11/19/2016   ALKPHOS 100 11/19/2016   BILITOT 0.7 11/19/2016   GFRNONAA NOT CALCULATED 11/19/2016   GFRAA NOT CALCULATED 11/19/2016    Lab Results  Component Value Date   WBC 9.1 08/12/2018   NEUTROABS 4.1 01/22/2018   HGB 7.9 (L) 08/12/2018   HCT 25.7 (L) 08/12/2018   MCV 80.3 08/12/2018   PLT 181 08/12/2018   Lab Results  Component Value Date   IRON 8 (L) 01/22/2018   TIBC 420 01/22/2018   IRONPCTSAT 2 (L) 01/22/2018   Lab Results  Component Value Date   FERRITIN 2 (L) 01/22/2018     STUDIES: No results found.  ASSESSMENT: Iron deficiency anemia.  PLAN:    1. Iron deficiency anemia: Patient has a significantly reduced hemoglobin and iron stores.  She had similar lab work with her previous pregnancy and received 2 doses of IV Feraheme in September 2018.  She has a significantly decreased MCV, but hemoglobin  electrophoresis is normal.  Proceed with 510 mg IV Feraheme today.  Return to clinic in 1 week for a second infusion.  Patient will then return to clinic the last week of May 2020 prior to her due date for repeat laboratory work, further evaluation, and consideration of additional Feraheme. 2.  Pregnancy: Patient reports her due date is August 25, 2018.  Continue follow-up with OB/GYN as scheduled.  I spent a total of 30 minutes face-to-face with the patient of which greater than 50% of the visit was spent in counseling and coordination of care as detailed above.   Patient expressed understanding and was in agreement with this plan. She also understands that She can call clinic at any time with any questions, concerns, or complaints.    Lloyd Huger, MD   11/06/2018 4:25 PM

## 2018-11-12 ENCOUNTER — Encounter: Payer: Self-pay | Admitting: Pharmacy Technician

## 2018-11-12 NOTE — Progress Notes (Signed)
Patient no longer getting Feraheme from [facility]. based on Medicaid active. Last DOS covered is 06/24/2018.

## 2018-11-13 ENCOUNTER — Inpatient Hospital Stay: Payer: Medicaid Other

## 2018-11-13 ENCOUNTER — Inpatient Hospital Stay: Payer: Medicaid Other | Admitting: Oncology

## 2018-11-25 ENCOUNTER — Other Ambulatory Visit: Payer: Self-pay

## 2018-11-25 ENCOUNTER — Ambulatory Visit: Payer: Medicaid Other | Admitting: Advanced Practice Midwife

## 2018-11-25 DIAGNOSIS — Z113 Encounter for screening for infections with a predominantly sexual mode of transmission: Secondary | ICD-10-CM

## 2018-11-25 DIAGNOSIS — Z0389 Encounter for observation for other suspected diseases and conditions ruled out: Secondary | ICD-10-CM | POA: Diagnosis not present

## 2018-11-25 DIAGNOSIS — Z3009 Encounter for other general counseling and advice on contraception: Secondary | ICD-10-CM | POA: Diagnosis not present

## 2018-11-25 DIAGNOSIS — Z1388 Encounter for screening for disorder due to exposure to contaminants: Secondary | ICD-10-CM | POA: Diagnosis not present

## 2018-11-25 LAB — WET PREP FOR TRICH, YEAST, CLUE
Trichomonas Exam: NEGATIVE
Yeast Exam: NEGATIVE

## 2018-11-25 MED ORDER — METRONIDAZOLE 500 MG PO TABS: 500.0000 mg | ORAL_TABLET | Freq: Two times a day (BID) | ORAL | 0 refills | Status: AC

## 2018-11-25 NOTE — Progress Notes (Signed)
    STI clinic/screening visit  Subjective:  Hailey Martin is a 23 y.o. G3P3 female being seen today for an STI screening visit. The patient reports they do have symptoms.  Patient has the following medical conditions:   Patient Active Problem List   Diagnosis Date Noted  . Uterine scar from previous cesarean delivery affecting pregnancy 08/11/2018  . Delivery of pregnancy by cesarean section 08/11/2018  . Positive GBS test 07/29/2018  . Supervision of high risk pregnancy, antepartum 06/05/2018  . Anemia affecting pregnancy in third trimester 11/17/2016  . Previous cesarean section 09/12/2016  . Late prenatal care 09/12/2016  . Marijuana use 08/23/2016  . Headache 07/06/2015  . Iron deficiency anemia 11/08/2014  . Dysmenorrhea 09/07/2014  . Allergic rhinitis, seasonal 09/07/2014  . 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 History of cervicitis 11/26/2012     Chief Complaint  Patient presents with  . SEXUALLY TRANSMITTED DISEASE    HPI  Patient reports menses stopped, had sex and menses returned with burning during sex and irritated vagina  See flowsheet for further details and programmatic requirements.    The following portions of the patient's history were reviewed and updated as appropriate: allergies, current medications, past medical history, past social history, past surgical history and problem list.  Objective:  There were no vitals filed for this visit.  Physical Exam Constitutional:      Appearance: Normal appearance. She is obese.  HENT:     Head: Normocephalic.     Nose: Nose normal.     Mouth/Throat:     Mouth: Mucous membranes are moist.  Eyes:     Conjunctiva/sclera: Conjunctivae normal.  Neck:     Musculoskeletal: Neck supple.  Cardiovascular:     Rate and Rhythm: Normal rate.  Pulmonary:     Effort: Pulmonary effort is normal.  Abdominal:     Palpations: Abdomen is soft. There is no mass.     Tenderness: There is no guarding.     Hernia: No hernia  is present.     Comments: Poor tone, increased adipose, soft without tenderness  Genitourinary:    General: Normal vulva.     Exam position: Lithotomy position.     Vagina: Signs of injury (bilater healing lacerations posterior aspect of vagina 7:00 and 5:00) present. Vaginal discharge (clear light pink spotting with malodor) present.     Cervix: Normal.     Uterus: Normal.      Adnexa: Right adnexa normal and left adnexa normal.     Rectum: Normal.  Skin:    General: Skin is warm and dry.  Neurological:     Mental Status: She is alert.  Psychiatric:        Mood and Affect: Mood normal.        Behavior: Behavior normal.       Assessment and Plan:  SHARMILA WROBLESKI is a 23 y.o. female presenting to the [facility] Department for STI screening.  BTL so doesn't want birth control  1. Screening examination for venereal disease Treat wet mount per standing orders Immunization nurse consult - WET PREP FOR [place], YEAST, CLUE - Syphilis Serology, [place] [facility] - HIV [place] [facility] - Chlamydia/Gonorrhea [place] [facility]     Return for PRN.  No future appointments.  Herbie Saxon, CNM

## 2018-11-25 NOTE — Progress Notes (Signed)
Wet prep reviewed-per CNM +BV treated Debera Lat, RN

## 2019-01-27 ENCOUNTER — Encounter: Payer: Self-pay | Admitting: Family Medicine

## 2019-01-27 ENCOUNTER — Ambulatory Visit: Payer: Medicaid Other | Admitting: Family Medicine

## 2019-01-27 ENCOUNTER — Other Ambulatory Visit: Payer: Self-pay

## 2019-01-27 ENCOUNTER — Ambulatory Visit: Payer: Medicaid Other

## 2019-01-27 DIAGNOSIS — Z3009 Encounter for other general counseling and advice on contraception: Secondary | ICD-10-CM | POA: Diagnosis not present

## 2019-01-27 DIAGNOSIS — Z113 Encounter for screening for infections with a predominantly sexual mode of transmission: Secondary | ICD-10-CM | POA: Diagnosis not present

## 2019-01-27 DIAGNOSIS — Z1388 Encounter for screening for disorder due to exposure to contaminants: Secondary | ICD-10-CM | POA: Diagnosis not present

## 2019-01-27 DIAGNOSIS — Z0389 Encounter for observation for other suspected diseases and conditions ruled out: Secondary | ICD-10-CM | POA: Diagnosis not present

## 2019-01-27 LAB — WET PREP FOR TRICH, YEAST, CLUE
Trichomonas Exam: NEGATIVE
Yeast Exam: NEGATIVE

## 2019-01-27 MED ORDER — METRONIDAZOLE 500 MG PO TABS: 500.0000 mg | ORAL_TABLET | Freq: Two times a day (BID) | ORAL | 0 refills | Status: AC

## 2019-01-27 NOTE — Progress Notes (Signed)
    STI clinic/screening visit  Subjective:  Hailey Martin is a 23 y.o. female being seen today for an STI screening visit. The patient reports they do have symptoms.  Patient has the following medical conditions:   Patient Active Problem List   Diagnosis Date Noted  . Uterine scar from previous cesarean delivery affecting pregnancy 08/11/2018  . Delivery of pregnancy by cesarean section 08/11/2018  . Positive GBS test 07/29/2018  . Supervision of high risk pregnancy, antepartum 06/05/2018  . Anemia affecting pregnancy in third trimester 11/17/2016  . Previous cesarean section 09/12/2016  . Late prenatal care 09/12/2016  . Marijuana use 08/23/2016  . Headache 07/06/2015  . Iron deficiency anemia 11/08/2014  . Dysmenorrhea 09/07/2014  . Allergic rhinitis, seasonal 09/07/2014  . 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 History of cervicitis 11/26/2012     No chief complaint on file.   HPI Patient reports that she has a h/o BV.  She feels that she has BV now  She has noticed a cloudy disch with a strong odor prior to her period this month.  She delivered 03/3242 without complications.  She had a BTL after delivery.  See flowsheet for further details and programmatic requirements.    The following portions of the patient's history were reviewed and updated as appropriate: allergies, current medications, past medical history, past social history, past surgical history and problem list.  Objective:  There were no vitals filed for this visit.  Physical Exam Genitourinary:    General: Normal vulva.     Vagina: Vaginal discharge present.     Comments: PH . 4.5, + odor noted on exam, scant white disch.   Assessment and Plan:  Hailey Martin is a 23 y.o. female presenting to the [place] for STI screening  1. Screening examination for venereal disease  - WET PREP FOR [place], YEAST, CLUE - Chlamydia/Gonorrhea [facility] - HIV [place] [facility] - Syphilis Serology,  [place] [facility].    2.Symptomatic/clinic BV treatment Metronidazole 500 mg po BIB x 7 days Co. Not to be sexually active x 1 wk.    No follow-ups on file.  No future appointments.  Hassell Done, FNP

## 2019-01-27 NOTE — Progress Notes (Signed)
Wet mount reviewed.  Patient tx'd for BV per C. Marzetta Board FNP VO .Aileen Fass, RN

## 2019-02-15 ENCOUNTER — Ambulatory Visit: Payer: Medicaid Other

## 2019-02-16 ENCOUNTER — Ambulatory Visit: Payer: Medicaid Other

## 2019-02-24 ENCOUNTER — Encounter: Payer: Self-pay | Admitting: Family Medicine

## 2019-02-24 ENCOUNTER — Ambulatory Visit: Payer: Medicaid Other | Admitting: Family Medicine

## 2019-02-24 ENCOUNTER — Other Ambulatory Visit: Payer: Self-pay

## 2019-02-24 DIAGNOSIS — Z1388 Encounter for screening for disorder due to exposure to contaminants: Secondary | ICD-10-CM | POA: Diagnosis not present

## 2019-02-24 DIAGNOSIS — Z0389 Encounter for observation for other suspected diseases and conditions ruled out: Secondary | ICD-10-CM | POA: Diagnosis not present

## 2019-02-24 DIAGNOSIS — Z113 Encounter for screening for infections with a predominantly sexual mode of transmission: Secondary | ICD-10-CM | POA: Diagnosis not present

## 2019-02-24 DIAGNOSIS — Z3009 Encounter for other general counseling and advice on contraception: Secondary | ICD-10-CM | POA: Diagnosis not present

## 2019-02-24 LAB — WET PREP FOR TRICH, YEAST, CLUE
Trichomonas Exam: NEGATIVE
Yeast Exam: NEGATIVE

## 2019-02-24 NOTE — Progress Notes (Signed)
    STI clinic/screening visit  Subjective:  Hailey Martin is a 23 y.o. female being seen today for an STI screening visit. The patient reports they do have symptoms.  Patient reports that they do not desire a pregnancy in the next year.   They reported they are not interested in discussing contraception today.   Patient has the following medical conditions:   Patient Active Problem List   Diagnosis Date Noted  . Delivery of pregnancy by cesarean section 08/11/2018  . Anemia affecting pregnancy in third trimester 11/17/2016  . Previous cesarean section 09/12/2016  . Dysmenorrhea 09/07/2014  . Allergic rhinitis, seasonal 09/07/2014  . 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  States that her symptoms haven't resolved since her presumptive treatment 01/27/2019. She hs stopped using bath soap in her genital area and she has not been sexually active since her last visit.    See flowsheet for further details and programmatic requirements.    The following portions of the patient's history were reviewed and updated as appropriate: allergies, current medications, past medical history, past social history, past surgical history and problem list.  Objective:  There were no vitals filed for this visit.  Physical Exam Constitutional:      Appearance: Normal appearance.  Neck:     Musculoskeletal: Neck supple. No muscular tenderness.  Abdominal:     Palpations: Abdomen is soft.     Tenderness: There is no abdominal tenderness.  Genitourinary:    General: Normal vulva.     Vagina: Vaginal discharge present.     Comments: sm amount of white, blood tinged disch., ph < 4.5, no odor noted on exam. Bimanual not indicated Lymphadenopathy:     Cervical: No cervical adenopathy.  Skin:    General: Skin is warm and dry.     Findings: No lesion or rash.  Neurological:     Mental Status: She is alert.     Assessment and Plan:  Hailey Martin is a 23 y.o. female presenting to the [place] for STI screening  1. Screening examination for venereal disease - WET PREP FOR [place], YEAST, CLUE - Chlamydia/Gonorrhea [place] [facility]  Treat wet prep as per SO- wet prep negative Discussed using Rephresh for 5 days to lower her pH and to use after sex.  Recommend that partner use condoms for 1-2 months- explained that semen was a different pH than her vagina.  Continue to avoid soaps in genital area and change laundry detergent to the "free" brand.   No follow-ups on file.  No future appointments.  Hassell Done, FNP

## 2019-02-24 NOTE — Progress Notes (Signed)
Wet mount reviewed. No tx per SO Ashantia Amaral, RN  

## 2019-03-05 ENCOUNTER — Other Ambulatory Visit: Payer: Medicaid Other

## 2019-04-02 ENCOUNTER — Other Ambulatory Visit: Payer: Self-pay

## 2019-04-02 ENCOUNTER — Ambulatory Visit (LOCAL_COMMUNITY_HEALTH_CENTER): Payer: Self-pay

## 2019-04-02 DIAGNOSIS — Z111 Encounter for screening for respiratory tuberculosis: Secondary | ICD-10-CM

## 2019-04-05 ENCOUNTER — Ambulatory Visit (LOCAL_COMMUNITY_HEALTH_CENTER): Payer: Medicaid Other

## 2019-04-05 ENCOUNTER — Other Ambulatory Visit: Payer: Self-pay

## 2019-04-05 DIAGNOSIS — Z111 Encounter for screening for respiratory tuberculosis: Secondary | ICD-10-CM

## 2019-04-05 LAB — TB SKIN TEST
Induration: 0 mm
TB Skin Test: NEGATIVE

## 2019-04-16 ENCOUNTER — Ambulatory Visit: Payer: Medicaid Other

## 2019-04-27 ENCOUNTER — Other Ambulatory Visit: Payer: Self-pay

## 2019-04-27 ENCOUNTER — Ambulatory Visit: Payer: Medicaid Other

## 2019-04-27 ENCOUNTER — Ambulatory Visit: Payer: Medicaid Other | Admitting: Physician Assistant

## 2019-04-27 DIAGNOSIS — N760 Acute vaginitis: Secondary | ICD-10-CM

## 2019-04-27 DIAGNOSIS — Z113 Encounter for screening for infections with a predominantly sexual mode of transmission: Secondary | ICD-10-CM

## 2019-04-27 DIAGNOSIS — Z3009 Encounter for other general counseling and advice on contraception: Secondary | ICD-10-CM | POA: Diagnosis not present

## 2019-04-27 DIAGNOSIS — B9689 Other specified bacterial agents as the cause of diseases classified elsewhere: Secondary | ICD-10-CM | POA: Diagnosis not present

## 2019-04-27 DIAGNOSIS — Z1388 Encounter for screening for disorder due to exposure to contaminants: Secondary | ICD-10-CM | POA: Diagnosis not present

## 2019-04-27 DIAGNOSIS — Z9851 Tubal ligation status: Secondary | ICD-10-CM

## 2019-04-27 DIAGNOSIS — Z0389 Encounter for observation for other suspected diseases and conditions ruled out: Secondary | ICD-10-CM | POA: Diagnosis not present

## 2019-04-27 LAB — WET PREP FOR TRICH, YEAST, CLUE
Trichomonas Exam: NEGATIVE
Yeast Exam: NEGATIVE

## 2019-04-29 ENCOUNTER — Encounter: Payer: Self-pay | Admitting: Physician Assistant

## 2019-04-29 ENCOUNTER — Ambulatory Visit: Payer: Medicaid Other

## 2019-04-29 DIAGNOSIS — Z9851 Tubal ligation status: Secondary | ICD-10-CM | POA: Insufficient documentation

## 2019-04-29 MED ORDER — METRONIDAZOLE 500 MG PO TABS: 500.0000 mg | ORAL_TABLET | Freq: Two times a day (BID) | ORAL | 0 refills | Status: DC

## 2019-04-29 NOTE — Progress Notes (Signed)
[facility] Department STI clinic/screening visit  Subjective:  Hailey Martin is a 24 y.o. female being seen today for an STI screening visit. The patient reports they do have symptoms.  Patient reports that they do not desire a pregnancy in the next year.   They reported they are not interested in discussing contraception today.  No LMP recorded.   Patient has the following medical conditions:   Patient Active Problem List   Diagnosis Date Noted  . Delivery of pregnancy by cesarean section 08/11/2018  . Anemia affecting pregnancy in third trimester 11/17/2016  . Previous cesarean section 09/12/2016  . Dysmenorrhea 09/07/2014  . Allergic rhinitis, seasonal 09/07/2014  . 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 SEXUALLY TRANSMITTED DISEASE    HPI  Patient reports that she has had a white discharge with odor and itching for 2 weeks.  Denies other symptoms.  LMP 04/26/19 and normal so far.  Patient is s/p BTL.  See flowsheet for further details and programmatic requirements.    The following portions of the patient's history were reviewed and updated as appropriate: allergies, current medications, past medical history, past social history, past surgical history and problem list.  Objective:  There were no vitals filed for this visit.  Physical Exam Constitutional:      General: She is not in acute distress.    Appearance: Normal appearance. She is normal weight.  HENT:     Head: Normocephalic and atraumatic.     Comments: No nits, lice or hair loss. No cervical, supraclavicular or axillary adenopathy.    Mouth/Throat:     Mouth: Mucous membranes are moist.     Pharynx: Oropharynx is clear. No oropharyngeal exudate or posterior oropharyngeal erythema.  Eyes:     Conjunctiva/sclera: Conjunctivae normal.  Pulmonary:     Effort: Pulmonary effort is normal.  Abdominal:     Palpations: Abdomen is soft.     Tenderness: There is no  abdominal tenderness. There is no guarding or rebound.     Hernia: No hernia is present.  Genitourinary:    General: Normal vulva.     Rectum: Normal.     Comments: External genitalia/pubic area without nits, lice, edema, erythema, lesions and inguinal adenopathy. Vagina with normal mucosa, moderate amount of menstrual blood present and vaginal odor. Cervix without visible lesions. Uterus firm, mobile, nt, no masses, no CMT, no adnexal tenderness or fullness. Musculoskeletal:     Cervical back: Neck supple. No tenderness.  Skin:    General: Skin is warm and dry.     Findings: No bruising, erythema, lesion or rash.  Neurological:     Mental Status: She is alert and oriented to person, place, and time.  Psychiatric:        Mood and Affect: Mood normal.        Behavior: Behavior normal.        Thought Content: Thought content normal.        Judgment: Judgment normal.      Assessment and Plan:  Hailey Martin is a 24 y.o. female presenting to the [facility] Department for STI screening  1. Screening for STD (sexually transmitted disease) Patient into clinic with symptoms.  Declines blood work today. Rec condoms with all sex. Await test results.  Counseled that RN will call if needs to RTC for further treatment once results are in. - [place], YEAST, [place] [facility]  2. BV (bacterial vaginosis) Will treat for BV with Metronidazole 500mg  #14 1 po BID for 7 days with food, no EtOH for 24 hr before and until 72 hr after completing medicine. No sex for 7 days. Enc to use OTC antifungal cream if has itching during or just after treatment with antibiotic. - metroNIDAZOLE (FLAGYL) 500 MG tablet; Take 1 tablet (500 mg total) by mouth 2 (two) times daily.  Dispense: 14 tablet; Refill: 0  No follow-ups on file.  No future appointments.  , PA

## 2019-04-30 ENCOUNTER — Ambulatory Visit: Payer: Medicaid Other

## 2019-05-03 ENCOUNTER — Ambulatory Visit: Payer: Medicaid Other

## 2019-05-07 ENCOUNTER — Telehealth: Payer: Self-pay | Admitting: General Practice

## 2019-05-07 NOTE — Telephone Encounter (Signed)
TC with patient.  Reports having sx's and surprised that GC/Chl results are negative.  C/O itching and d/c.  Explained to patient that she may have yeast from taking MTZ, still requests f/u appt.  Scheduled patient for 05/13/19 Richmond Campbell, RN

## 2019-05-07 NOTE — Telephone Encounter (Signed)
calling about test results

## 2019-05-13 ENCOUNTER — Ambulatory Visit: Payer: Self-pay

## 2019-05-23 NOTE — Progress Notes (Signed)
Chart reviewed by Pharmacist  Suzanne Walker PharmD, Contract Pharmacist at  [facility]  

## 2019-06-09 ENCOUNTER — Ambulatory Visit: Payer: Medicaid Other

## 2019-06-14 ENCOUNTER — Encounter: Payer: Self-pay | Admitting: Physician Assistant

## 2019-06-14 ENCOUNTER — Ambulatory Visit: Payer: Medicaid Other | Admitting: Physician Assistant

## 2019-06-14 ENCOUNTER — Other Ambulatory Visit: Payer: Self-pay

## 2019-06-14 DIAGNOSIS — B9689 Other specified bacterial agents as the cause of diseases classified elsewhere: Secondary | ICD-10-CM

## 2019-06-14 DIAGNOSIS — N760 Acute vaginitis: Secondary | ICD-10-CM | POA: Diagnosis not present

## 2019-06-14 DIAGNOSIS — Z0389 Encounter for observation for other suspected diseases and conditions ruled out: Secondary | ICD-10-CM | POA: Diagnosis not present

## 2019-06-14 DIAGNOSIS — Z1388 Encounter for screening for disorder due to exposure to contaminants: Secondary | ICD-10-CM | POA: Diagnosis not present

## 2019-06-14 DIAGNOSIS — Z3009 Encounter for other general counseling and advice on contraception: Secondary | ICD-10-CM | POA: Diagnosis not present

## 2019-06-14 DIAGNOSIS — Z113 Encounter for screening for infections with a predominantly sexual mode of transmission: Secondary | ICD-10-CM

## 2019-06-14 LAB — WET PREP FOR TRICH, YEAST, CLUE
Trichomonas Exam: NEGATIVE
Yeast Exam: NEGATIVE

## 2019-06-14 MED ORDER — METRONIDAZOLE 500 MG PO TABS: 500.0000 mg | ORAL_TABLET | Freq: Two times a day (BID) | ORAL | 0 refills | Status: AC

## 2019-06-14 NOTE — Progress Notes (Signed)
In for screening due to +discharge; declines HIV/RPR testing Sharlette Dense, RN

## 2019-06-14 NOTE — Progress Notes (Signed)
[facility] Department STI clinic/screening visit  Subjective:  Hailey Martin is a 24 y.o. female being seen today for an STI screening visit. The patient reports they do have symptoms.  Patient reports that they do not desire a pregnancy in the next year.   They reported they are not interested in discussing contraception today.  Patient's last menstrual period was 05/23/2019 (exact date).   Patient has the following medical conditions:   Patient Active Problem List   Diagnosis Date Noted  . History of bilateral tubal ligation 04/29/2019  . Anemia affecting pregnancy in third trimester 11/17/2016  . Previous cesarean section 09/12/2016  . Dysmenorrhea 09/07/2014  . Allergic rhinitis, seasonal 09/07/2014  . 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 SEXUALLY TRANSMITTED DISEASE    HPI  Patient reports that she has had milky discharge with an odor for 1 week.  Denies other symptoms and requests check for BV.  Declines blood work today.  See flowsheet for further details and programmatic requirements.    The following portions of the patient's history were reviewed and updated as appropriate: allergies, current medications, past medical history, past social history, past surgical history and problem list.  Objective:  There were no vitals filed for this visit.  Physical Exam Constitutional:      General: She is not in acute distress.    Appearance: Normal appearance. She is normal weight.  HENT:     Head: Normocephalic and atraumatic.     Comments: No nits, lice, or hair loss. No cervical, supraclavicular or axillary adenopathy.    Mouth/Throat:     Mouth: Mucous membranes are moist.     Pharynx: Oropharynx is clear. No oropharyngeal exudate or posterior oropharyngeal erythema.  Eyes:     Conjunctiva/sclera: Conjunctivae normal.  Pulmonary:     Effort: Pulmonary effort is normal.  Abdominal:     Palpations: Abdomen is soft.  There is no mass.     Tenderness: There is no abdominal tenderness. There is no guarding or rebound.  Genitourinary:    General: Normal vulva.     Rectum: Normal.     Comments: External genitalia/pubic area without nits, lice, edema, erythema, lesions and inguinal adenopathy. Vagina with normal mucosa and moderate amount of thin, grayish discharge with pH=>4.5. Cervix without visible lesions. Uterus firm, mobile, nt, no masses, no CMT, no adnexal tenderness or fullness. Musculoskeletal:     Cervical back: Neck supple. No tenderness.  Skin:    General: Skin is warm and dry.     Findings: No bruising, erythema, lesion or rash.  Neurological:     Mental Status: She is alert and oriented to person, place, and time.  Psychiatric:        Mood and Affect: Mood normal.        Behavior: Behavior normal.        Thought Content: Thought content normal.        Judgment: Judgment normal.      Assessment and Plan:  Hailey Martin is a 24 y.o. female presenting to the [facility] Department for STI screening  1. Screening for STD (sexually transmitted disease) Patient with symptoms.  Declines blood work today. Rec condoms with all sex. Await test results.  Counseled that RN will call if needs to RTC for further treatment once results are back.  - WET PREP FOR [place], YEAST, [place] [facility]  2. BV (bacterial vaginosis) Treat  for BV with Metroniazole 500mg  #14 1 po BID for 7 days with food, no EtOH for 24 hr before and until 72 hr after completing medicine. No sex for 7 days. Rec using OTC antifungal cream if has itching during or just after antibiotic treatment. - metroNIDAZOLE (FLAGYL) 500 MG tablet; Take 1 tablet (500 mg total) by mouth 2 (two) times daily for 7 days.  Dispense: 14 tablet; Refill: 0     No follow-ups on file.  No future appointments.  , PA

## 2019-06-14 NOTE — Progress Notes (Signed)
Wet Mount results reviewed. Patient treated per provider orders. Osiris Charles, RN  

## 2019-08-18 IMAGING — US US OB COMP LESS 14 WK
1 series · 14 of 28 positions shown · non-contrast
Comparison: None.

CLINICAL DATA: Vaginal bleeding on and off for 9 weeks. Pregnant
patient. Beta HCG level [DATE].

EXAM:
OBSTETRIC <14 WK ULTRASOUND
TECHNIQUE: Transabdominal ultrasound was performed for evaluation of the
gestation as well as the maternal uterus and adnexal regions.

[Series 1: us ob comp less 14 wk · 0.14mm/px · 35 acquisitions, 14 frames shown]
[im 2/35]
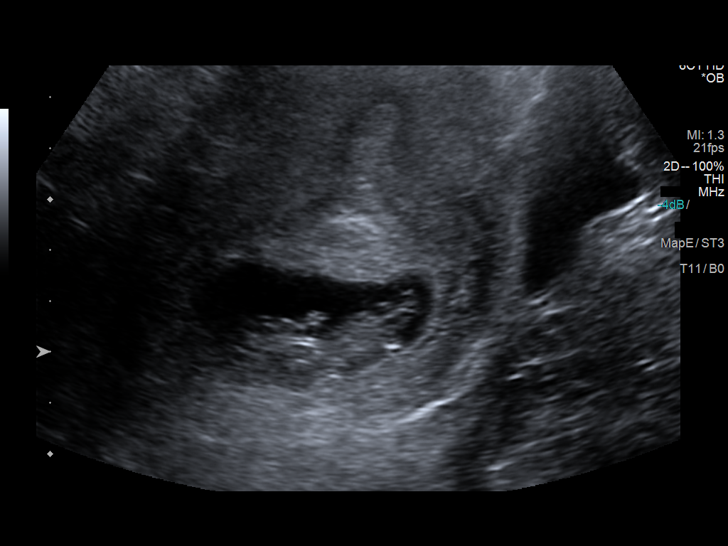
[im 4/35]
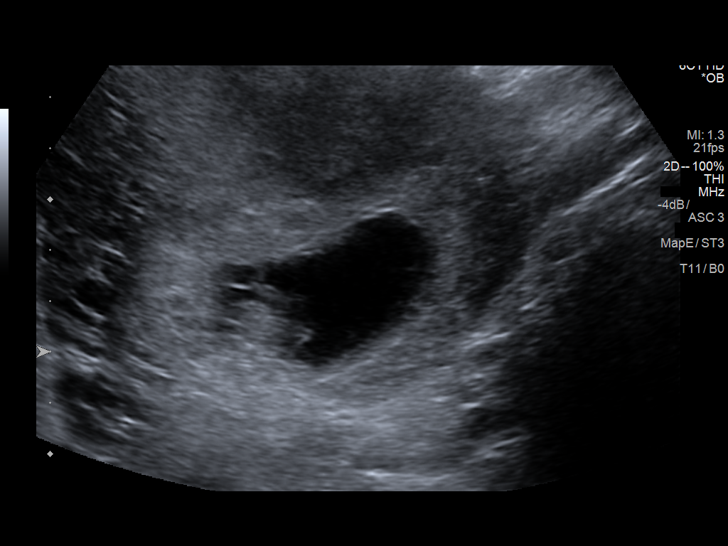
[im 7/35]
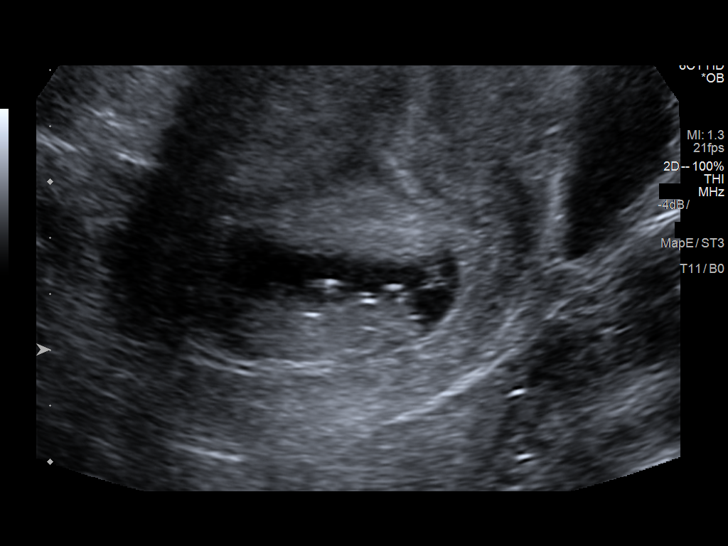
[im 9/35]
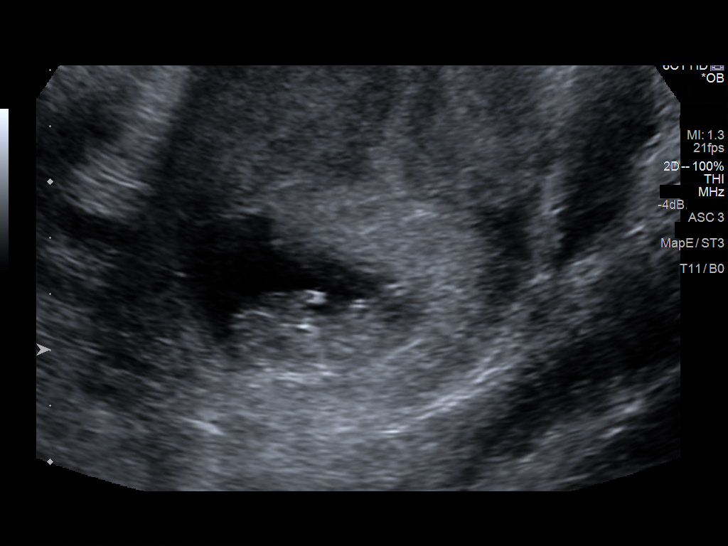
[im 12/35]
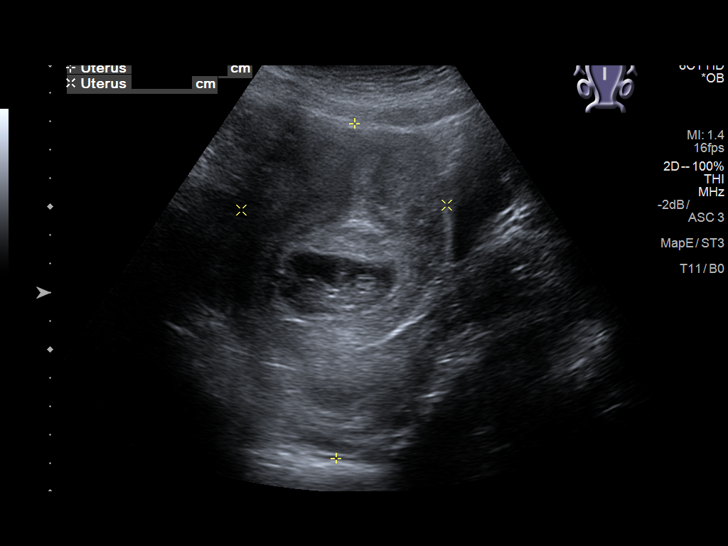
[im 14/35]
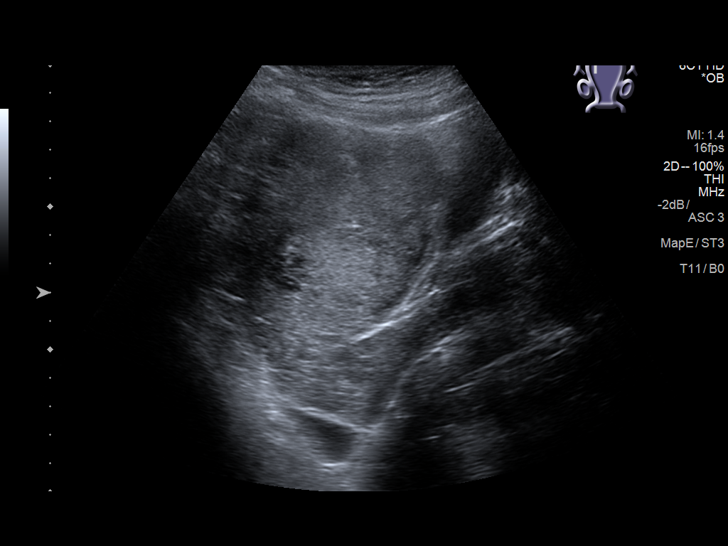
[im 17/35]
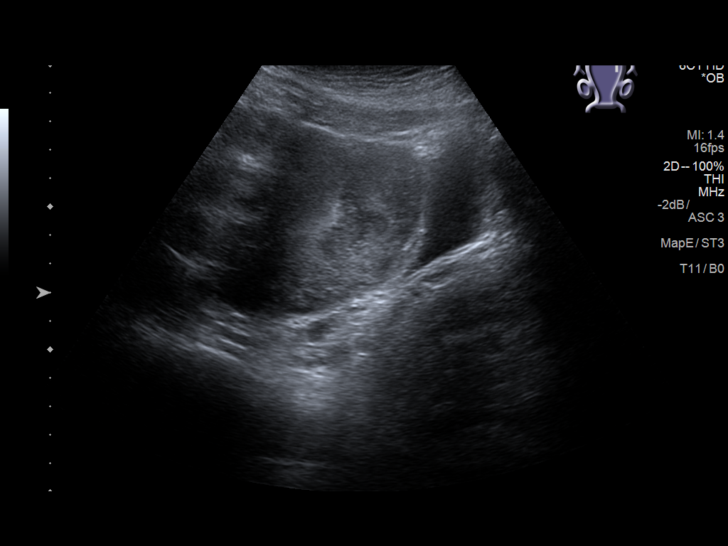
[im 19/35]
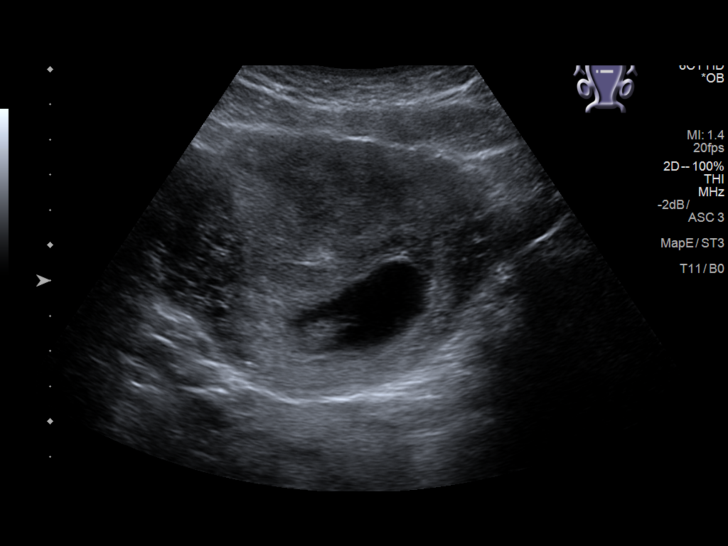
[im 22/35]
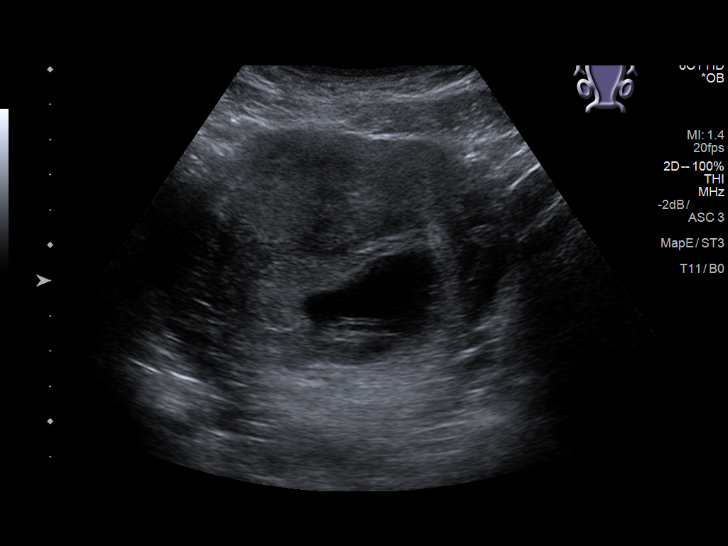
[im 24/35]
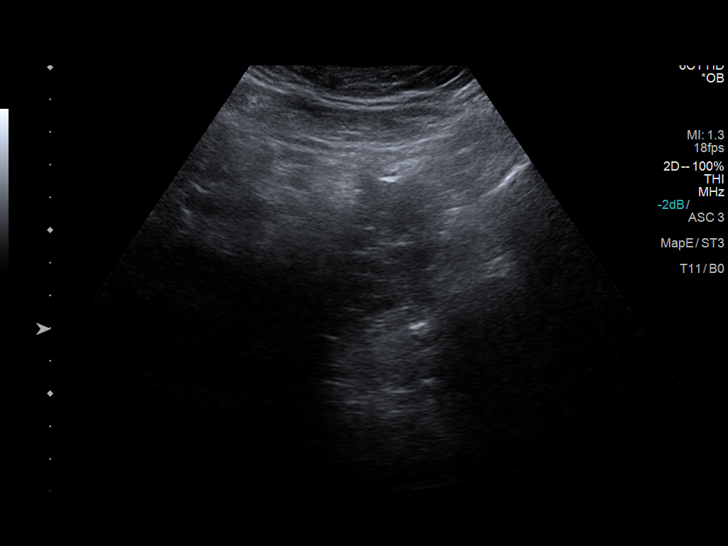
[im 27/35]
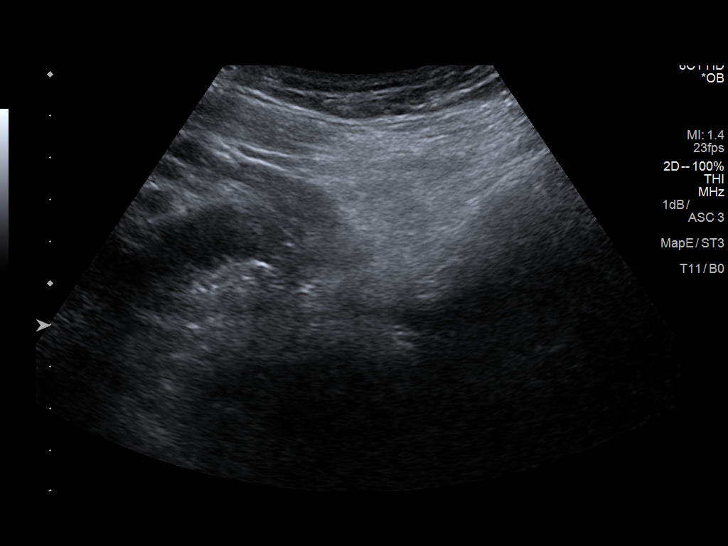
[im 29/35]
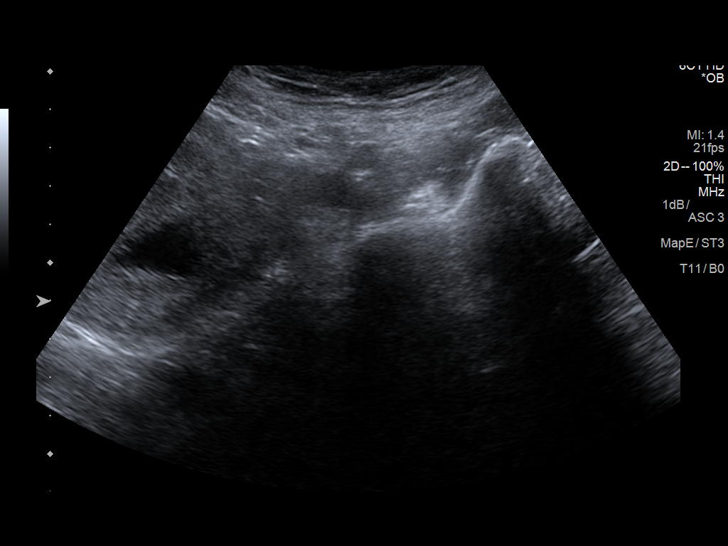
[im 32/35]
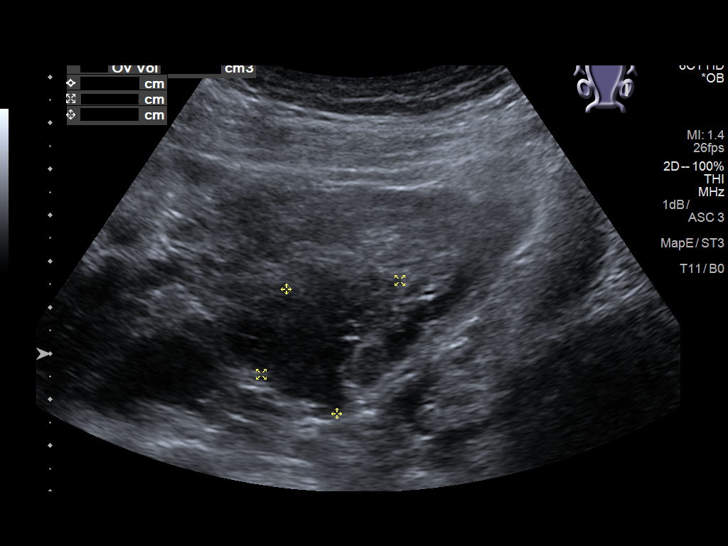
[im 35/35]
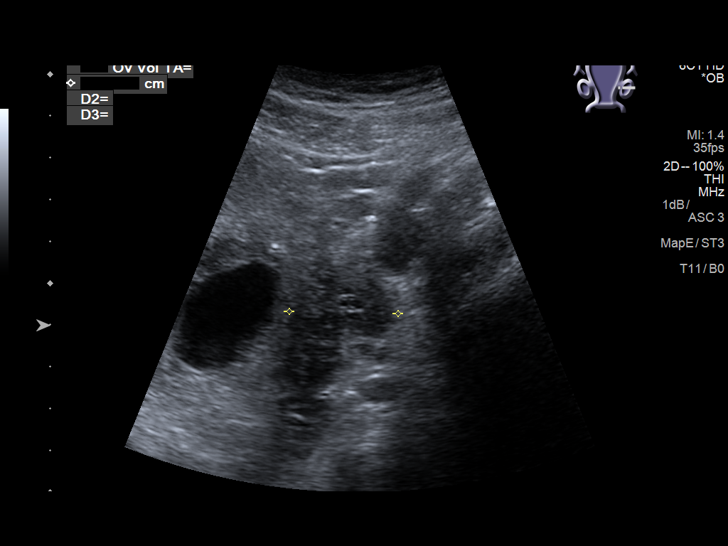

[14 of 28 positions shown; findings below may reference images not displayed]

FINDINGS: Intrauterine gestational sac: Single

Yolk sac:  Not Visualized.

Embryo:  Visualized.

Cardiac Activity: Visualized.

Heart Rate: 178 bpm

CRL:   33.9 mm   10 w 2 d                  US EDC: 08/18/2018

Subchorionic hemorrhage:  None visualized.

Maternal uterus/adnexae: Uterus is unremarkable. No masses. Cervix
is closed. Normal left ovary. Right ovary not visualized. No adnexal
masses. No abnormal pelvic free fluid.
IMPRESSION: 1. Single live intrauterine pregnancy with a measured gestational
age of 10 weeks and 2 days.
2. No pregnancy complication.

## 2019-09-23 DIAGNOSIS — Z419 Encounter for procedure for purposes other than remedying health state, unspecified: Secondary | ICD-10-CM | POA: Diagnosis not present

## 2019-10-24 DIAGNOSIS — Z419 Encounter for procedure for purposes other than remedying health state, unspecified: Secondary | ICD-10-CM | POA: Diagnosis not present

## 2019-11-24 DIAGNOSIS — Z419 Encounter for procedure for purposes other than remedying health state, unspecified: Secondary | ICD-10-CM | POA: Diagnosis not present

## 2019-12-13 ENCOUNTER — Other Ambulatory Visit: Payer: Self-pay

## 2019-12-13 ENCOUNTER — Ambulatory Visit: Payer: Medicaid Other | Admitting: Advanced Practice Midwife

## 2019-12-13 ENCOUNTER — Encounter: Payer: Self-pay | Admitting: Advanced Practice Midwife

## 2019-12-13 DIAGNOSIS — E663 Overweight: Secondary | ICD-10-CM | POA: Insufficient documentation

## 2019-12-13 DIAGNOSIS — Z113 Encounter for screening for infections with a predominantly sexual mode of transmission: Secondary | ICD-10-CM

## 2019-12-13 LAB — WET PREP FOR TRICH, YEAST, CLUE
Trichomonas Exam: NEGATIVE
Yeast Exam: NEGATIVE

## 2019-12-13 NOTE — Progress Notes (Signed)
Wet mount reviewed with provider and is negative today. No treatment needed for wet mount per standing order and per provider verbal order. Awaiting further TR's. Counseled pt per provider orders and pt states understanding. Provider orders completed.

## 2019-12-13 NOTE — Progress Notes (Signed)
[facility] Department STI clinic/screening visit  Subjective:  Hailey Martin is a 24 y.o. SBF G3P3 nonsmoker female being seen today for an STI screening visit. The patient reports they do have symptoms.  Patient reports that they do not desire a pregnancy in the next year.   They reported they are not interested in discussing contraception today.  Patient's last menstrual period was 12/10/2019 (exact date).   Patient has the following medical conditions:   Patient Active Problem List   Diagnosis Date Noted  . Overweight 181 lbs 12/13/2019  . History of bilateral tubal ligation 04/29/2019  . Anemia affecting pregnancy in third trimester 11/17/2016  . Previous cesarean section 09/12/2016  . Dysmenorrhea 09/07/2014  . Allergic rhinitis, seasonal 09/07/2014  . 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   HPI  Patient reports white d/c with malodor x 2 wks. LMP 12/11/19.  Last sex 12/10/19 without condom; with current partner off and on x 6 years.  1 sex partner in last 3 mo.  Last MJ 2018.  Last ETOH 12/10/19 (1 [place] [place]) 1x/mo.  Has BTL.  Last pap age 49 per pt. Denies cigs, vaping  Last HIV test per patient/review of record was 01/27/2019 Patient reports last pap was age 89  See flowsheet for further details and programmatic requirements.    The following portions of the patient's history were reviewed and updated as appropriate: allergies, current medications, past medical history, past social history, past surgical history and problem list.  Objective:  There were no vitals filed for this visit.  Physical Exam Vitals and nursing note reviewed.  Constitutional:      Appearance: Normal appearance.  HENT:     Head: Normocephalic and atraumatic.     Mouth/Throat:     Mouth: Mucous membranes are moist.     Pharynx: Oropharynx is clear. No oropharyngeal exudate or posterior oropharyngeal erythema.  Eyes:     Conjunctiva/sclera:  Conjunctivae normal.  Pulmonary:     Effort: Pulmonary effort is normal.  Abdominal:     Palpations: Abdomen is soft. There is no mass.     Tenderness: There is no abdominal tenderness. There is no rebound.     Comments: Soft without tenderness  Genitourinary:    General: Normal vulva.     Exam position: Lithotomy position.     Pubic Area: No rash or pubic lice.      Labia:        Right: No rash or lesion.        Left: No rash or lesion.      Vagina: Bleeding (moderate red menses blood) present. No vaginal discharge, erythema or lesions.     Cervix: Normal.     Uterus: Normal.      Adnexa: Right adnexa normal and left adnexa normal.     Rectum: Normal.     Lymphadenopathy:     Head:     Right side of head: No preauricular or posterior auricular adenopathy.     Left side of head: No preauricular or posterior auricular adenopathy.     Cervical: No cervical adenopathy.     Upper Body:     Right upper body: No supraclavicular or axillary adenopathy.     Left upper body: No supraclavicular or axillary adenopathy.     Lower Body: No right inguinal adenopathy. No left inguinal adenopathy.  Skin:    General: Skin is warm and dry.     Findings:  No rash.  Neurological:     Mental Status: She is alert and oriented to person, place, and time.      Assessment and Plan:  Hailey Martin is a 24 y.o. female presenting to the [facility] Department for STI screening  1. Overweight 181 lbs   2. Screening examination for venereal disease Treat wet mount per standing orders Immunization nurse consult - WET PREP FOR TRICH, YEAST, CLUE - Chlamydia/Gonorrhea [place] [facility] - Syphilis Serology, [place] [facility] - HIV [place] [facility]     Return if symptoms worsen or fail to improve.  No future appointments.  Alberteen Spindle, CNM

## 2019-12-17 LAB — GONOCOCCUS CULTURE

## 2019-12-24 DIAGNOSIS — Z419 Encounter for procedure for purposes other than remedying health state, unspecified: Secondary | ICD-10-CM | POA: Diagnosis not present

## 2020-01-05 IMAGING — US OBSTETRIC 14+ WK ULTRASOUND
1 series · 13 of 28 positions shown · non-contrast
Comparison: none

CLINICAL DATA: High-risk pregnancy.  Fetal anatomy survey.

EXAM:
OBSTETRICAL ULTRASOUND >14 WKS

[Series 1: obstetric 14+ wk ultrasound · 0.28mm/px · 13 of 95 slices shown]
[im 4/95]
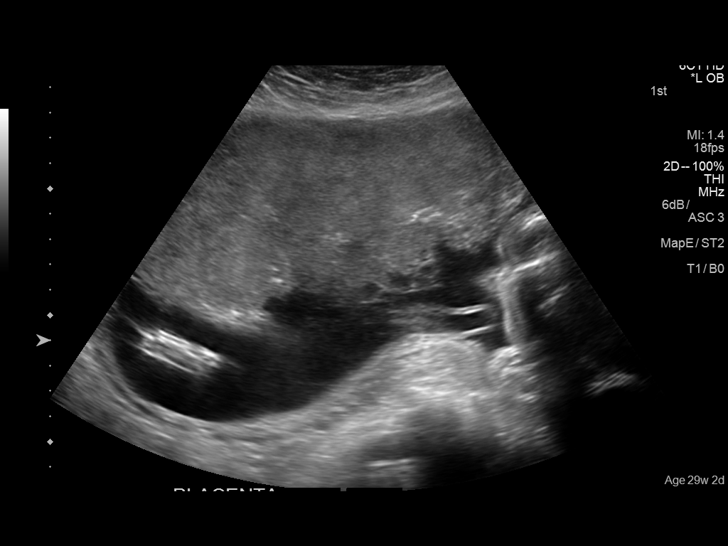
[im 11/95]
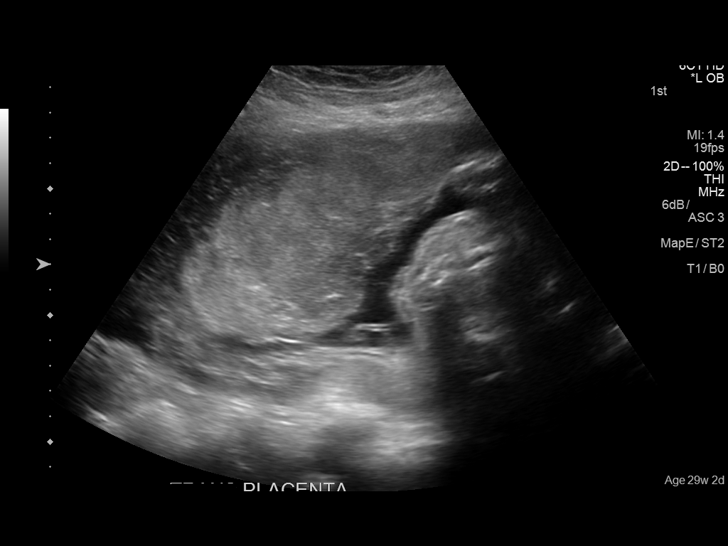
[im 18/95]
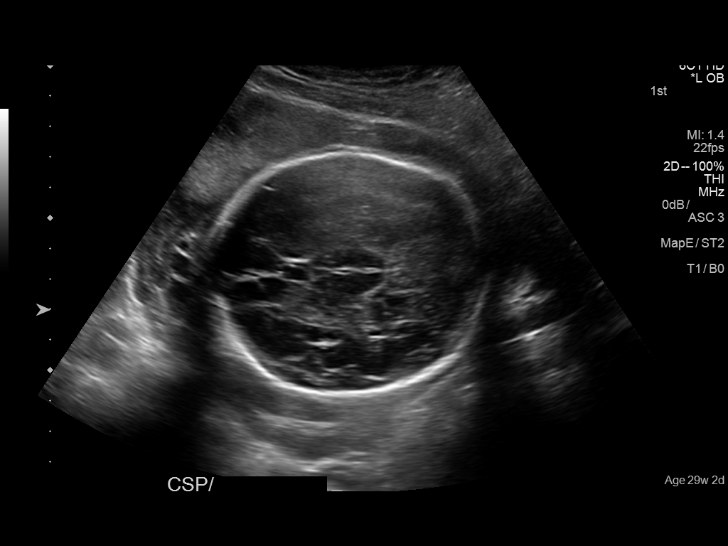
[im 25/95]
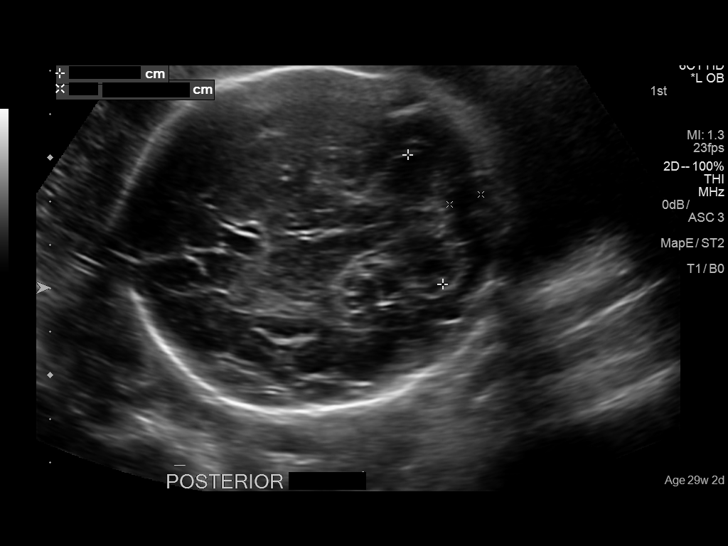
[im 32/95]
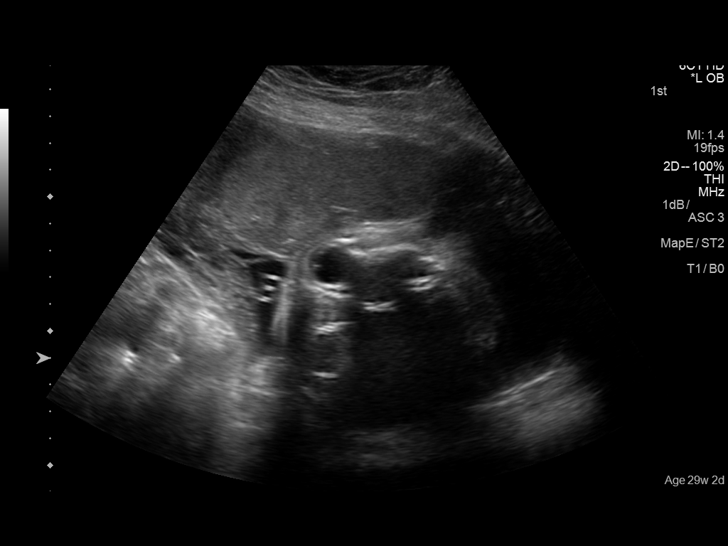
[im 39/95]
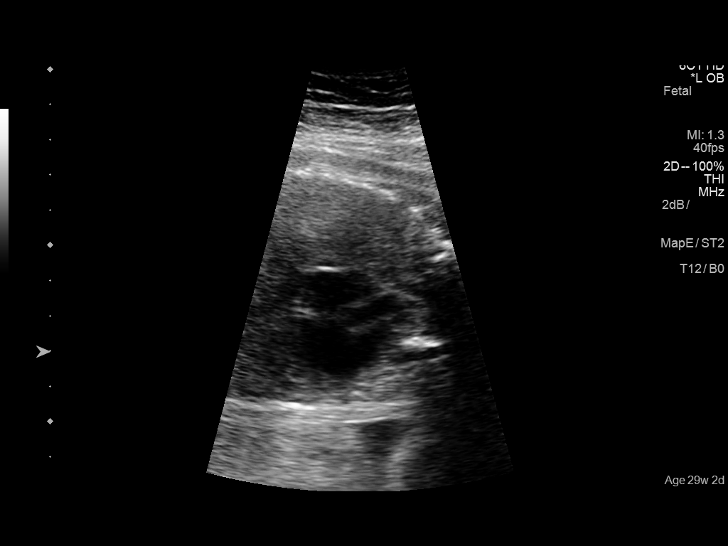
[im 49/95]
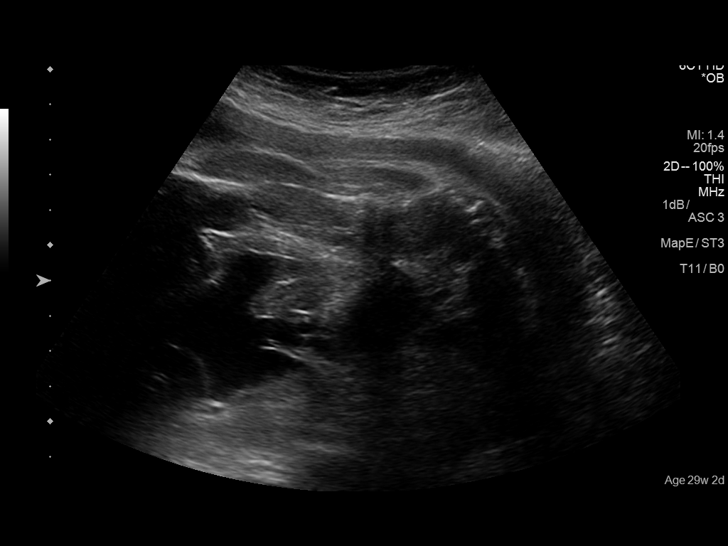
[im 56/95]
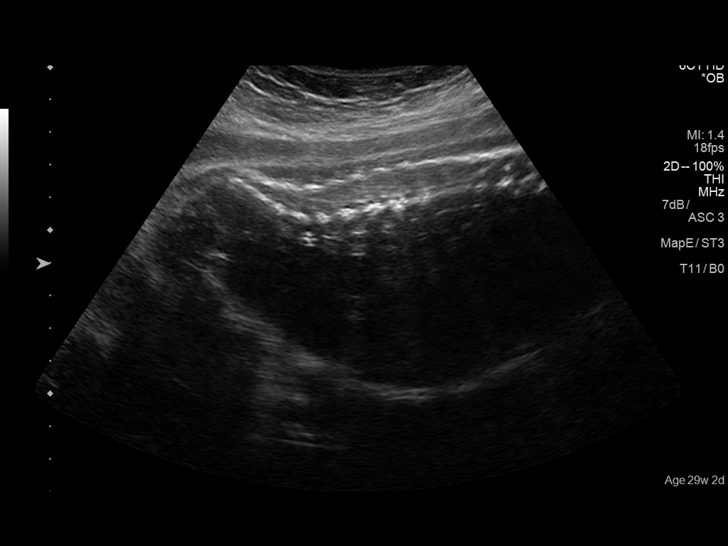
[im 63/95]
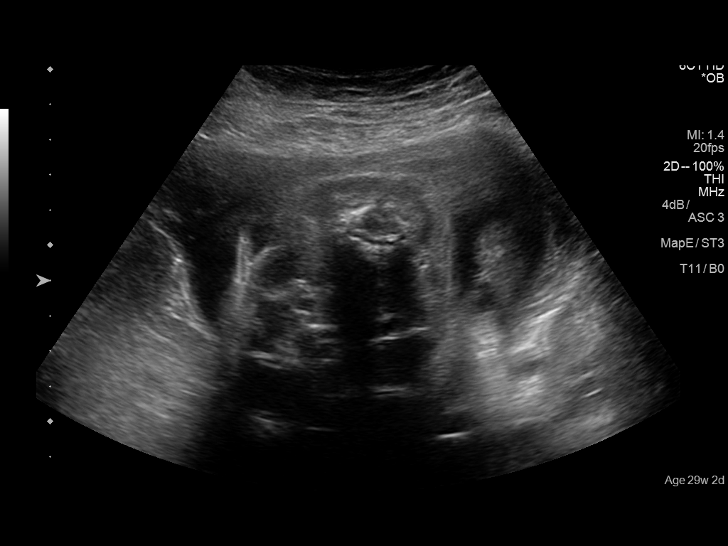
[im 70/95]
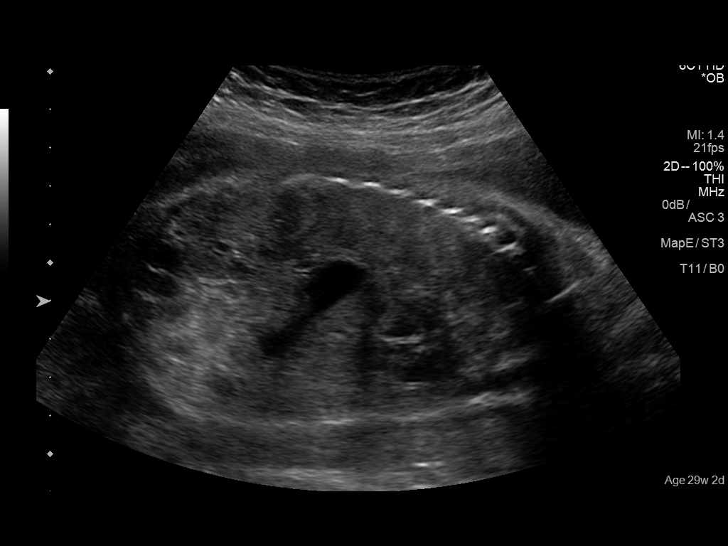
[im 77/95]
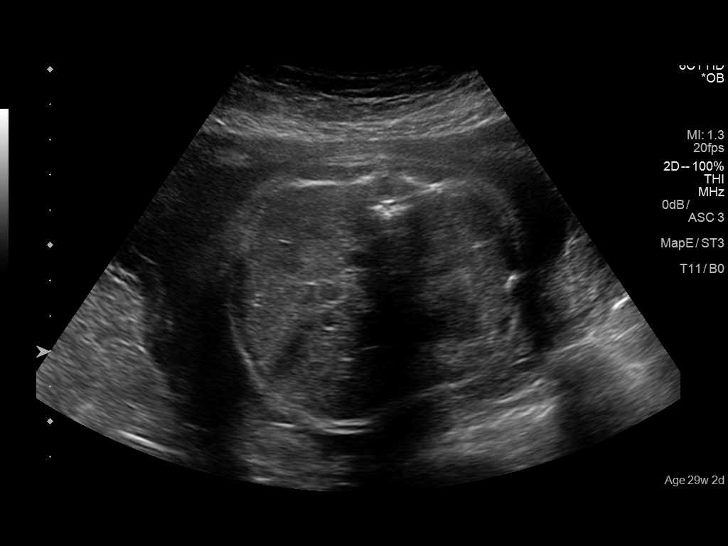
[im 84/95]
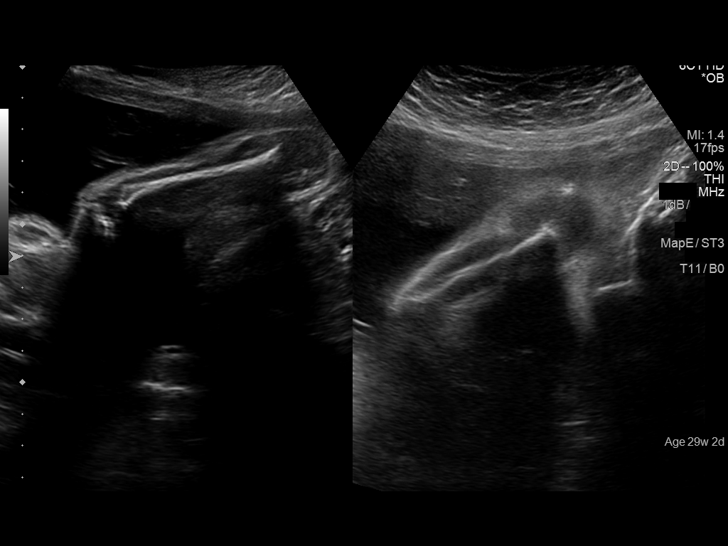
[im 91/95]
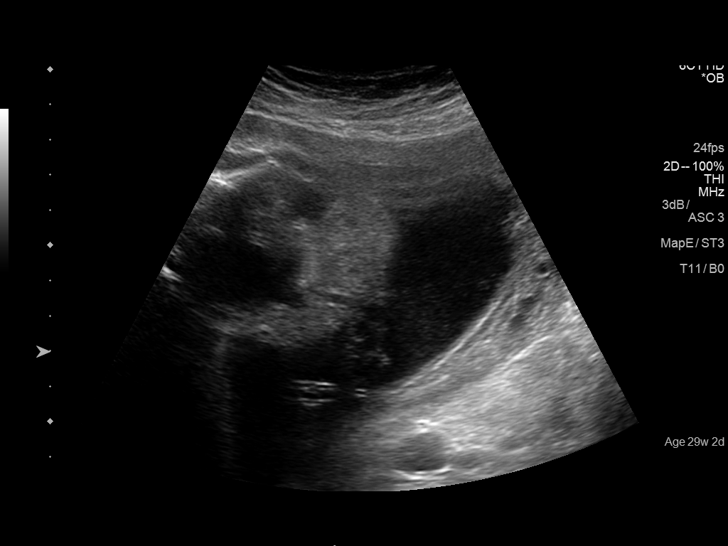

[13 of 28 positions shown; findings below may reference images not displayed]

FINDINGS: Number of Fetuses: 1

Heart Rate:  152 bpm

Movement: Present

Presentation: Cephalic

Previa: No

Placental Location: Anterior

Amniotic Fluid (Subjective): Normal

Amniotic Fluid (Objective):

AFI = 18.9 cm (5%ile= 9.2 cm, 95%= 23.1 cm for 29 wks)

FETAL BIOMETRY

BPD: 7.9cm 31w 5d

HC:   28.0cm 30w 5d

AC:   26.6cm 30w 5d

FL:   5.6cm 29w 2d

Current Mean GA: 30w 4d US EDC: 08/16/2018

Assigned GA:  29Sc Tow Assigned EDC: 08/25/2018

Estimated Fetal Weight:  1,553g 75%ile

FETAL ANATOMY

Lateral Ventricles: Appears normal

Thalami/CSP: Appears normal

Posterior Fossa:  Appears normal

Nuchal Region: Appears normal

Upper Lip: Appears normal

Spine: Appears normal

4 Chamber Heart on Left: Appears normal

LVOT: Appears normal

RVOT: Appears normal

Stomach on Left: Appears normal

3 Vessel Cord: Appears normal

Cord Insertion site: Appears normal

Kidneys: Appears normal

Bladder: Appears normal

Extremities: Appears normal

Technically difficult due to: Limited exam due to fetal position.
Fetal profile/face not visualized.

Maternal Findings:

Cervix:  3.5 cm
IMPRESSION: Single viable intrauterine pregnancy at 30 weeks 4 days. Cephalic
presentation. Exam limited due to fetal position as above.

## 2020-01-13 ENCOUNTER — Encounter: Payer: Self-pay | Admitting: Physician Assistant

## 2020-01-13 ENCOUNTER — Other Ambulatory Visit: Payer: Self-pay

## 2020-01-13 ENCOUNTER — Ambulatory Visit: Payer: Medicaid Other | Admitting: Physician Assistant

## 2020-01-13 DIAGNOSIS — Z113 Encounter for screening for infections with a predominantly sexual mode of transmission: Secondary | ICD-10-CM | POA: Diagnosis not present

## 2020-01-13 DIAGNOSIS — Z299 Encounter for prophylactic measures, unspecified: Secondary | ICD-10-CM

## 2020-01-13 LAB — WET PREP FOR TRICH, YEAST, CLUE
Trichomonas Exam: NEGATIVE
Yeast Exam: NEGATIVE

## 2020-01-13 MED ORDER — CLOTRIMAZOLE 1 % VA CREA: 1.0000 | TOPICAL_CREAM | Freq: Every day | VAGINAL | 0 refills | Status: AC

## 2020-01-13 NOTE — Progress Notes (Signed)
Wet mount reviewed by provider, dispensed Clotrimazole 1% vaginal cream per provider order. Provider orders completed.

## 2020-01-13 NOTE — Progress Notes (Signed)
[facility] Department STI clinic/screening visit  Subjective:  Hailey Martin is a 24 y.o. female being seen today for an STI screening visit. The patient reports they do have symptoms.  Patient reports that they do not desire a pregnancy in the next year.   They reported they are not interested in discussing contraception today.  No LMP recorded.   Patient has the following medical conditions:   Patient Active Problem List   Diagnosis Date Noted  . Overweight 181 lbs 12/13/2019  . History of bilateral tubal ligation 04/29/2019  . Anemia affecting pregnancy in third trimester 11/17/2016  . Previous cesarean section 09/12/2016  . Dysmenorrhea 09/07/2014  . Allergic rhinitis, seasonal 09/07/2014  . 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 SEXUALLY TRANSMITTED DISEASE    STD screening except bloodwork    HPI  Patient reports that she has had internal and external itching and white discharge for 5 days.  Denies chronic conditions and regular medicines.  States that she had her physical exam with pap about 3 yr ago and HIV testing 3 weeks ago.  Using BTL as her BCM and last period was 12/31/2019.   See flowsheet for further details and programmatic requirements.    The following portions of the patient's history were reviewed and updated as appropriate: allergies, current medications, past medical history, past social history, past surgical history and problem list.  Objective:  There were no vitals filed for this visit.  Physical Exam Constitutional:      General: She is not in acute distress.    Appearance: Normal appearance.  HENT:     Head: Normocephalic and atraumatic.     Comments: No nits,lice, or hair loss. No cervical, supraclavicular or axillary adenopathy.    Mouth/Throat:     Mouth: Mucous membranes are moist.     Pharynx: Oropharynx is clear. No oropharyngeal exudate or posterior oropharyngeal erythema.  Eyes:      Conjunctiva/sclera: Conjunctivae normal.  Pulmonary:     Effort: Pulmonary effort is normal.  Abdominal:     Palpations: Abdomen is soft. There is no mass.     Tenderness: There is no abdominal tenderness. There is no guarding or rebound.  Genitourinary:    General: Normal vulva.     Rectum: Normal.     Comments: External genitalia/pubic area without nits, lice, edema, erythema, lesions and inguinal adenopathy. Vagina with normal mucosa and small amount of white, slightly clumping discharge. Cervix without visible lesions. Uterus firm, mobile, nt, no masses, no CMT, no adnexal tenderness or fullness. Musculoskeletal:     Cervical back: Neck supple. No tenderness.  Skin:    General: Skin is warm and dry.     Findings: No bruising, erythema, lesion or rash.  Neurological:     Mental Status: She is alert and oriented to person, place, and time.  Psychiatric:        Mood and Affect: Mood normal.        Behavior: Behavior normal.        Thought Content: Thought content normal.        Judgment: Judgment normal.      Assessment and Plan:  Hailey Martin is a 24 y.o. female presenting to the [facility] Department for STI screening  1. Screening for STD (sexually transmitted disease) Patient into clinic with symptoms. Patient declines blood work today.  Rec condoms with all sex. Await test results.  Counseled that RN  will call if needs to RTC for treatment once results are back. - WET PREP FOR TRICH, YEAST, CLUE - Chlamydia/Gonorrhea [place] [facility]  2. Prophylactic measure Will treat to cover for possible yeast with Clotrimazole 1% vaginal cream 1 app qhs for 7 days while awaiting other results. No sex for 7 days. - clotrimazole (CLOTRIMAZOLE-7) 1 % vaginal cream; Place 1 Applicatorful vaginally at bedtime for 7 days.  Dispense: 45 g; Refill: 0     No follow-ups on file.  No future appointments.  Matt Holmes, PA

## 2020-01-14 ENCOUNTER — Ambulatory Visit: Payer: Medicaid Other

## 2020-01-24 DIAGNOSIS — Z419 Encounter for procedure for purposes other than remedying health state, unspecified: Secondary | ICD-10-CM | POA: Diagnosis not present

## 2020-02-22 ENCOUNTER — Other Ambulatory Visit: Payer: Self-pay

## 2020-02-22 ENCOUNTER — Observation Stay
Admission: EM | Admit: 2020-02-22 | Discharge: 2020-02-23 | Disposition: A | Discharge status: Home / Self Care | Payer: Medicaid Other | Attending: Internal Medicine | Admitting: Internal Medicine

## 2020-02-22 ENCOUNTER — Observation Stay: Payer: Medicaid Other

## 2020-02-22 DIAGNOSIS — N920 Excessive and frequent menstruation with regular cycle: Secondary | ICD-10-CM | POA: Diagnosis not present

## 2020-02-22 DIAGNOSIS — R112 Nausea with vomiting, unspecified: Secondary | ICD-10-CM | POA: Diagnosis not present

## 2020-02-22 DIAGNOSIS — N939 Abnormal uterine and vaginal bleeding, unspecified: Secondary | ICD-10-CM | POA: Diagnosis not present

## 2020-02-22 DIAGNOSIS — N921 Excessive and frequent menstruation with irregular cycle: Secondary | ICD-10-CM | POA: Insufficient documentation

## 2020-02-22 DIAGNOSIS — D62 Acute posthemorrhagic anemia: Secondary | ICD-10-CM | POA: Insufficient documentation

## 2020-02-22 DIAGNOSIS — D500 Iron deficiency anemia secondary to blood loss (chronic): Secondary | ICD-10-CM | POA: Diagnosis not present

## 2020-02-22 DIAGNOSIS — Z20822 Contact with and (suspected) exposure to covid-19: Secondary | ICD-10-CM | POA: Diagnosis not present

## 2020-02-22 DIAGNOSIS — D509 Iron deficiency anemia, unspecified: Secondary | ICD-10-CM | POA: Diagnosis present

## 2020-02-22 DIAGNOSIS — R0602 Shortness of breath: Secondary | ICD-10-CM | POA: Diagnosis not present

## 2020-02-22 DIAGNOSIS — N858 Other specified noninflammatory disorders of uterus: Secondary | ICD-10-CM | POA: Diagnosis not present

## 2020-02-22 DIAGNOSIS — R1110 Vomiting, unspecified: Secondary | ICD-10-CM

## 2020-02-22 DIAGNOSIS — N854 Malposition of uterus: Secondary | ICD-10-CM | POA: Diagnosis not present

## 2020-02-22 DIAGNOSIS — D649 Anemia, unspecified: Principal | ICD-10-CM | POA: Insufficient documentation

## 2020-02-22 DIAGNOSIS — R42 Dizziness and giddiness: Secondary | ICD-10-CM | POA: Diagnosis present

## 2020-02-22 LAB — MAGNESIUM: Magnesium: 2 mg/dL (ref 1.7–2.4)

## 2020-02-22 LAB — COMPREHENSIVE METABOLIC PANEL
ALT: 13 U/L (ref 0–44)
AST: 17 U/L (ref 15–41)
Albumin: 4.4 g/dL (ref 3.5–5.0)
Alkaline Phosphatase: 46 U/L (ref 38–126)
Anion gap: 13 (ref 5–15)
BUN: 12 mg/dL (ref 6–20)
CO2: 25 mmol/L (ref 22–32)
Calcium: 9.4 mg/dL (ref 8.9–10.3)
Chloride: 102 mmol/L (ref 98–111)
Creatinine, Ser: 0.44 mg/dL (ref 0.44–1.00)
GFR, Estimated: >60 mL/min (ref >60)
Glucose, Bld: 102 mg/dL — ABNORMAL HIGH (ref 70–99)
Potassium: 3.1 mmol/L — ABNORMAL LOW (ref 3.5–5.1)
Sodium: 140 mmol/L (ref 135–145)
Total Bilirubin: 0.9 mg/dL (ref 0.3–1.2)
Total Protein: 7.8 g/dL (ref 6.5–8.1)

## 2020-02-22 LAB — URINALYSIS, COMPLETE (UACMP) WITH MICROSCOPIC
Bacteria, UA: NONE SEEN
Bilirubin Urine: NEGATIVE
Glucose, UA: NEGATIVE mg/dL
Ketones, ur: 20 mg/dL — AB
Nitrite: NEGATIVE
Protein, ur: >=300 mg/dL — AB
RBC / HPF: >50 RBC/hpf — ABNORMAL HIGH (ref 0–5)
Specific Gravity, Urine: 1.026 (ref 1.005–1.030)
WBC, UA: >50 WBC/hpf — ABNORMAL HIGH (ref 0–5)
pH: 6 (ref 5.0–8.0)

## 2020-02-22 LAB — IRON AND TIBC
Iron: 10 ug/dL — ABNORMAL LOW (ref 28–170)
Saturation Ratios: 2 % — ABNORMAL LOW (ref 10.4–31.8)
TIBC: 531 ug/dL — ABNORMAL HIGH (ref 250–450)
UIBC: 521 ug/dL

## 2020-02-22 LAB — CBC
HCT: 21.3 % — ABNORMAL LOW (ref 36.0–46.0)
Hemoglobin: 5.2 g/dL — ABNORMAL LOW (ref 12.0–15.0)
MCH: 13.3 pg — ABNORMAL LOW (ref 26.0–34.0)
MCHC: 24.4 g/dL — ABNORMAL LOW (ref 30.0–36.0)
MCV: 54.6 fL — ABNORMAL LOW (ref 80.0–100.0)
Platelets: 372 10*3/uL (ref 150–400)
RBC: 3.9 MIL/uL (ref 3.87–5.11)
RDW: 21.9 % — ABNORMAL HIGH (ref 11.5–15.5)
WBC: 6 10*3/uL (ref 4.0–10.5)
nRBC: 0 % (ref 0.0–0.2)

## 2020-02-22 LAB — PREGNANCY, URINE: Preg Test, Ur: NEGATIVE

## 2020-02-22 LAB — WET PREP, GENITAL
Clue Cells Wet Prep HPF POC: NONE SEEN
Sperm: NONE SEEN
Trich, Wet Prep: NONE SEEN
Yeast Wet Prep HPF POC: NONE SEEN

## 2020-02-22 LAB — RESP PANEL BY RT-PCR (FLU A&B, COVID) ARPGX2
Influenza A by PCR: NEGATIVE
Influenza B by PCR: NEGATIVE
SARS Coronavirus 2 by RT PCR: NEGATIVE

## 2020-02-22 LAB — LIPASE, BLOOD: Lipase: 26 U/L (ref 11–51)

## 2020-02-22 LAB — CHLAMYDIA/NGC RT PCR (ARMC ONLY)
Chlamydia Tr: NOT DETECTED
N gonorrhoeae: NOT DETECTED

## 2020-02-22 LAB — PREPARE RBC (CROSSMATCH)

## 2020-02-22 MED ORDER — SODIUM CHLORIDE 0.9% FLUSH: 3.0000 mL | INTRAVENOUS | Status: DC | PRN

## 2020-02-22 MED ORDER — SODIUM CHLORIDE 0.9 % IV SOLN: 250.0000 mL | INTRAVENOUS | Status: DC | PRN

## 2020-02-22 MED ORDER — SODIUM CHLORIDE 0.9% FLUSH
3.0000 mL | Freq: Two times a day (BID) | INTRAVENOUS | Status: DC
  Administered 2020-02-23: 10:00:00 3 mL via INTRAVENOUS

## 2020-02-22 MED ORDER — POTASSIUM CHLORIDE IN NACL 40-0.9 MEQ/L-% IV SOLN
INTRAVENOUS | Status: DC
  Filled 2020-02-22 (×4): qty 1000

## 2020-02-22 MED ORDER — ONDANSETRON HCL 4 MG/2ML IJ SOLN
4.0000 mg | Freq: Four times a day (QID) | INTRAMUSCULAR | Status: DC | PRN
  Administered 2020-02-22: 4 mg via INTRAVENOUS
  Filled 2020-02-22: qty 2

## 2020-02-22 MED ORDER — SODIUM CHLORIDE 0.9 % IV SOLN
10.0000 mL/h | Freq: Once | INTRAVENOUS | Status: AC
  Administered 2020-02-22: 10 mL/h via INTRAVENOUS

## 2020-02-22 MED ORDER — ONDANSETRON HCL 4 MG PO TABS: 4.0000 mg | ORAL_TABLET | Freq: Four times a day (QID) | ORAL | Status: DC | PRN

## 2020-02-22 NOTE — Consult Note (Signed)
Reason for Consult:Menorrhagia Referring Physician: Agbata, Hailey Martin is an 24 y.o. female.  HPI: 24 year old G3, P3 with menorrhagia who presented to the ED for acute blood loss.  She reports that she is feeling unwell at home and having significant nausea and vomiting even with small sips of water.  This is how she knew something was not right.  She was found to be very anemic.  She is being admitted for a blood transfusion.  She reports a history of chronic menorrhagia over the last year.  Patient delivered about a year ago and has been using a tubal ligation for birth control.  In the past she took Depo-Provera and oral contraceptive pills which she reports did help reduce her bleeding.  She denies any personal history of fibroids or although she reports that her mother has had struggles with fibroids in the past.  She reports that her bleeding has been very light today and has largely resolved.  She is on day 3 of her regular 6-day menstrual cycle.   Gynecological History   LMP: 02/20/2020 Describes periods as regular monthly periods.  Reports that the her cycles generally last 6 days.  Reports that she has heavy bleeding during her menstrual cycle.  She denies passage of large clots.  She reports that she does have flooding and gushing sensations when she stands sometimes.  She also reports that she is to change her tampon or pad every 30 minutes because the saturated. She denies any history of history of fibroids, polyps, or ovarian cyst She denies a history of PCOS or endometriosis Sexually Active: Yes she currently uses tubal ligation for birth control.  Last pap smear: 2018, result not in computer, due to repeat which can be done outpatient.   Obstetrical History  OB History  Gravida Para Term Preterm AB Living  3 3 3  0 0 3  SAB TAB Ectopic Multiple Live Births  0 0 0 0 3    # Outcome Date GA Lbr Len/2nd Weight Sex Delivery Anes PTL Lv  3 Term 08/11/18 [redacted]w[redacted]d  3110  g M CS-LTranv Spinal  LIV  2 Term 01/28/17 [redacted]w[redacted]d   F CS-LTranv  N LIV  1 Term 08/29/15 [redacted]w[redacted]d  2890 g F  Gen  LIV     Complications: Other Excessive Bleeding, Abruptio Placenta    Obstetric Comments  LTCS for placental abruption      Past Medical History:  Diagnosis Date  . Allergy   . Anemia   . Delivery of pregnancy by cesarean section 08/11/2018  . Dysmenorrhea   . History of cervicitis 11/26/2012   Chlamydia    . Overweight   . Seborrhea     Past Surgical History:  Procedure Laterality Date  . CESAREAN SECTION N/A 08/29/2015   Procedure: CESAREAN SECTION;  Surgeon: 10/29/2015, MD;  Location: [facility] ORS;  Service: Obstetrics;  Laterality: N/A;  . CESAREAN SECTION N/A 01/28/2017   Procedure: CESAREAN SECTION;  Surgeon: 13/08/2016, MD;  Location: [facility] ORS;  Service: Obstetrics;  Laterality: N/A;  . CESAREAN SECTION WITH BILATERAL TUBAL LIGATION N/A 08/11/2018   Procedure: CESAREAN SECTION WITH TUBAL LIGATION;  Surgeon: 08/13/2018, MD;  Location: [facility] ORS;  Service: Obstetrics;  Laterality: N/A;  . WISDOM TOOTH EXTRACTION      Family History  Problem Relation Age of Onset  . Heart failure Father 55  . Alcohol abuse Father   . Kidney disease Father   . Liver disease Father   .  Thyroid disease Mother   . Breast cancer Paternal Grandmother   . Diabetes Paternal Grandmother   . Hypertension Paternal Grandmother   . Colon cancer Paternal Grandfather   . Hypertension Paternal Grandfather   . Heart disease Paternal Grandfather     Social History:  reports that she has never smoked. She has never used smokeless tobacco. She reports current alcohol use of about 1.0 standard drink of alcohol per week. She reports previous drug use. Drug: Marijuana.  Allergies: No Known Allergies  Medications: I have reviewed the patient's current medications.  Results for orders placed or performed during the hospital encounter of 02/22/20 (from the past 48 hour(s))   Urinalysis, Complete w Microscopic     Status: Abnormal   Collection Time: 02/22/20  2:23 PM  Result Value Ref Range   Color, Urine AMBER (A) YELLOW    Comment: BIOCHEMICALS MAY BE AFFECTED BY COLOR   APPearance CLOUDY (A) CLEAR   Specific Gravity, Urine 1.026 1.005 - 1.030   pH 6.0 5.0 - 8.0   Glucose, UA NEGATIVE NEGATIVE mg/dL   Hgb urine dipstick LARGE (A) NEGATIVE   Bilirubin Urine NEGATIVE NEGATIVE   Ketones, ur 20 (A) NEGATIVE mg/dL   Protein, ur >=960>=300 (A) NEGATIVE mg/dL   Nitrite NEGATIVE NEGATIVE   Leukocytes,Ua TRACE (A) NEGATIVE   RBC / HPF >50 (H) 0 - 5 RBC/hpf   WBC, UA >50 (H) 0 - 5 WBC/hpf   Bacteria, UA NONE SEEN NONE SEEN   Squamous Epithelial / LPF 0-5 0 - 5   Mucus PRESENT     Comment: Performed at [facility], [address] [place], [place] [address]  Iron and TIBC     Status: Abnormal   Collection Time: 02/22/20  2:25 PM  Result Value Ref Range   Iron 10 (L) 28 - 170 ug/dL   TIBC 811531 (H) 914250 - 782450 ug/dL   Saturation Ratios 2 (L) 10.4 - 31.8 %   UIBC 521 ug/dL    Comment: Performed at [facility], [address] [place], [place] [address]  Lipase, blood     Status: None   Collection Time: 02/22/20  2:26 PM  Result Value Ref Range   Lipase 26 11 - 51 U/L    Comment: Performed at [facility], [address] [place], [place] [address]  Comprehensive metabolic panel     Status: Abnormal   Collection Time: 02/22/20  2:26 PM  Result Value Ref Range   Sodium 140 135 - 145 mmol/L   Potassium 3.1 (L) 3.5 - 5.1 mmol/L   Chloride 102 98 - 111 mmol/L   CO2 25 22 - 32 mmol/L   Glucose, Bld 102 (H) 70 - 99 mg/dL    Comment: Glucose reference range applies only to samples taken after fasting for at least 8 hours.   BUN 12 6 - 20 mg/dL   Creatinine, Ser 7.840.44 0.44 - 1.00 mg/dL   Calcium 9.4 8.9 - 69.610.3 mg/dL   Total Protein 7.8 6.5 - 8.1 g/dL   Albumin 4.4 3.5 - 5.0 g/dL   AST 17 15 - 41 U/L   ALT 13 0 - 44 U/L   Alkaline  Phosphatase 46 38 - 126 U/L   Total Bilirubin 0.9 0.3 - 1.2 mg/dL   GFR, Estimated >29>60 >52>60 mL/min    Comment: (NOTE) Calculated using the CKD-EPI Creatinine Equation (2021)    Anion gap 13 5 - 15    Comment: Performed at  [facility], [address] [place], [place] [address]  CBC     Status: Abnormal   Collection Time: 02/22/20  2:26 PM  Result Value Ref Range   WBC 6.0 4.0 - 10.5 K/uL   RBC 3.90 3.87 - 5.11 MIL/uL   Hemoglobin 5.2 (L) 12.0 - 15.0 g/dL    Comment: Reticulocyte Hemoglobin testing may be clinically indicated, consider ordering this additional test [id number]    HCT 21.3 (L) 36 - 46 %   MCV 54.6 (L) 80.0 - 100.0 fL   MCH 13.3 (L) 26.0 - 34.0 pg   MCHC 24.4 (L) 30.0 - 36.0 g/dL   RDW 46.9 (H) 62.9 - 52.8 %   Platelets 372 150 - 400 K/uL   nRBC 0.0 0.0 - 0.2 %    Comment: Performed at [facility], [address] [place], [place] [address]  Pregnancy, urine     Status: None   Collection Time: 02/22/20  2:26 PM  Result Value Ref Range   Preg Test, Ur NEGATIVE NEGATIVE    Comment: Performed at [facility], [address], [place], [place] [address]  Type and screen [facility]     Status: None (Preliminary result)   Collection Time: 02/22/20  3:20 PM  Result Value Ref Range   ABO/RH(D) A POS    Antibody Screen NEG    Sample Expiration 02/25/2020,2359    Unit Number [id number]    Blood Component Type RBC LR PHER1    Unit division 00    Status of Unit ISSUED    Transfusion Status OK TO TRANSFUSE    Crossmatch Result      Compatible Performed at [facility], [address] [place], [place] [address]    Unit Number [id number]    Blood Component Type RED CELLS,LR    Unit division 00    Status of Unit ISSUED    Transfusion Status OK TO TRANSFUSE    Crossmatch Result Compatible   Resp Panel by RT-PCR (Flu A&B, Covid) Nasopharyngeal Swab     Status: None   Collection Time: 02/22/20  3:45 PM    Specimen: Nasopharyngeal Swab; Nasopharyngeal(NP) swabs in vial transport medium  Result Value Ref Range   SARS Coronavirus 2 by RT PCR NEGATIVE NEGATIVE    Comment: (NOTE) SARS-CoV-2 target nucleic acids are NOT DETECTED.  The SARS-CoV-2 RNA is generally detectable in upper respiratory specimens during the acute phase of infection. The lowest concentration of SARS-CoV-2 viral copies this assay can detect is 138 copies/mL. A negative result does not preclude SARS-Cov-2 infection and should not be used as the sole basis for treatment or other patient management decisions. A negative result may occur with  improper specimen collection/handling, submission of specimen other than nasopharyngeal swab, presence of viral mutation(s) within the areas targeted by this assay, and inadequate number of viral copies(<138 copies/mL). A negative result must be combined with clinical observations, patient history, and epidemiological information. The expected result is Negative.  Fact Sheet for Patients:  BloggerCourse.com  Fact Sheet for Healthcare Providers:  SeriousBroker.it  This test is no t yet approved or cleared by the [place] FDA and  has been authorized for detection and/or diagnosis of SARS-CoV-2 by FDA under an Emergency Use Authorization (EUA). This EUA will remain  in effect (meaning this test can be used) for the duration of the COVID-19 declaration under Section 564(b)(1) of the Act, 21 U.S.C.section 360bbb-3(b)(1), unless the authorization is terminated  or  revoked sooner.       Influenza A by PCR NEGATIVE NEGATIVE   Influenza B by PCR NEGATIVE NEGATIVE    Comment: (NOTE) The Xpert Xpress SARS-CoV-2/FLU/RSV plus assay is intended as an aid in the diagnosis of influenza from Nasopharyngeal swab specimens and should not be used as a sole basis for treatment. Nasal washings and aspirates are unacceptable for Xpert Xpress  SARS-CoV-2/FLU/RSV testing.  Fact Sheet for Patients: BloggerCourse.com  Fact Sheet for Healthcare Providers: SeriousBroker.it  This test is not yet approved or cleared by the [place] FDA and has been authorized for detection and/or diagnosis of SARS-CoV-2 by FDA under an Emergency Use Authorization (EUA). This EUA will remain in effect (meaning this test can be used) for the duration of the COVID-19 declaration under Section 564(b)(1) of the Act, 21 U.S.C. section 360bbb-3(b)(1), unless the authorization is terminated or revoked.  Performed at [facility], [address] [place], [place] [address]   Prepare RBC (crossmatch)     Status: None   Collection Time: 02/22/20  3:48 PM  Result Value Ref Range   Order Confirmation      ORDER PROCESSED BY BLOOD BANK Performed at [facility], [address] [place], [place] [address]   Wet prep, genital     Status: Abnormal   Collection Time: 02/22/20  3:49 PM  Result Value Ref Range   Yeast Wet Prep HPF POC NONE SEEN NONE SEEN   Trich, Wet Prep NONE SEEN NONE SEEN   Clue Cells Wet Prep HPF POC NONE SEEN NONE SEEN   WBC, Wet Prep HPF POC FEW (A) NONE SEEN   Sperm NONE SEEN     Comment: Performed at [facility], [address] [place], [place] [address]  Chlamydia/NGC rt PCR [facility] only)     Status: None   Collection Time: 02/22/20  3:49 PM   Specimen: Cervical/Vaginal swab  Result Value Ref Range   Specimen source GC/Chlam URINE, RANDOM    Chlamydia Tr NOT DETECTED NOT DETECTED   N gonorrhoeae NOT DETECTED NOT DETECTED    Comment: (NOTE) This CT/NG assay has not been evaluated in patients with a history of  hysterectomy. Performed at [facility], [address] [place], [place] [address]     US PELVIC COMPLETE WITH TRANSVAGINAL  Result Date: 02/22/2020 CLINICAL DATA:  Menorrhagia with irregular cycles EXAM: TRANSABDOMINAL  AND TRANSVAGINAL ULTRASOUND OF PELVIS TECHNIQUE: Both transabdominal and transvaginal ultrasound examinations of the pelvis were performed. Transabdominal technique was performed for global imaging of the pelvis including uterus, ovaries, adnexal regions, and pelvic cul-de-sac. It was necessary to proceed with endovaginal exam following the transabdominal exam to visualize the bilateral ovaries. COMPARISON:  Obstetrical ultrasound 06/22/2018 FINDINGS: Uterus Measurements: 7.8 x 5.1 x 6.5 cm = volume: 135.3 mL. Anteverted and slightly anteflexed uterus. Endometrium Thickness: 2.3 mm. Slightly heterogeneous with echogenic endometrial debris and trace fluid. Additionally, there is subendometrial cystic change at the uterine fundus (image 27/76). Right ovary Measurements: 2.8 x 1.3 x 1.2 cm = volume: 2.4 mL. Normal appearance of the ovary. No concerning adnexal lesion. Left ovary Measurements: 3.5 x 1.4 x 1.6 cm = volume: 4 mL. Normal appearance of the ovary. No concerning adnexal lesion. Other findings Trace to small anechoic volume free fluid predominantly within the right adnexa. IMPRESSION: 1. Slightly heterogeneous endometrium with echogenic debris and trace fluid. Findings could reflect endometrial blood products. Subendometrial cystic change at the uterine fundus is nonspecific though can be seen  in the setting of adenomyosis. If bleeding remains unresponsive to hormonal or medical therapy, sonohysterogram should be considered for focal lesion work-up. (Ref: Radiological Reasoning: Algorithmic Workup of Abnormal Vaginal Bleeding with Endovaginal Sonography and Sonohysterography. AJR 2008; [id number]:O13-08) 2. Trace to small volume of anechoic free fluid in the right adnexa, nonspecific and often physiologic in a reproductive age female. Electronically Signed   By: Kreg Shropshire M.D.   On: 02/22/2020 18:51    Review of Systems  Constitutional: Negative for chills and fever.  HENT: Negative for congestion, hearing  loss and sinus pain.   Respiratory: Negative for cough, shortness of breath and wheezing.   Cardiovascular: Negative for chest pain, palpitations and leg swelling.  Gastrointestinal: Negative for abdominal pain, constipation, diarrhea, nausea and vomiting.  Genitourinary: Negative for dysuria, flank pain, frequency, hematuria and urgency.  Musculoskeletal: Negative for back pain.  Skin: Negative for rash.  Neurological: Negative for dizziness and headaches.  Psychiatric/Behavioral: Negative for suicidal ideas. The patient is not nervous/anxious.    Blood pressure 129/69, pulse 64, temperature 99 F (37.2 C), temperature source Oral, resp. rate (!) 21, height 5\' 6"  (1.676 m), weight 81.6 kg, last menstrual period 02/22/2020, SpO2 100 %, not currently breastfeeding. Physical Exam Vitals and nursing note reviewed.  Constitutional:      Appearance: She is well-developed.  HENT:     Head: Normocephalic and atraumatic.  Cardiovascular:     Rate and Rhythm: Normal rate and regular rhythm.  Pulmonary:     Effort: Pulmonary effort is normal.     Breath sounds: Normal breath sounds.  Abdominal:     General: Bowel sounds are normal.     Palpations: Abdomen is soft.  Musculoskeletal:        General: Normal range of motion.  Skin:    General: Skin is warm and dry.  Neurological:     Mental Status: She is alert and oriented to person, place, and time.  Psychiatric:        Behavior: Behavior normal.        Thought Content: Thought content normal.        Judgment: Judgment normal.     Assessment/Plan: 24 year old with menorrhagia with regular cycle.  Pelvic ultrasound was reviewed and normal although report is still pending.  No evidence of fibroids or polyps.  Thin endometrium. Discussed options for management of menorrhagia with the patient including oral birth control pills, extended-cycle birth control pills, Depo-Provera IUD and Nexplanon.  Patient seems most interested in oral birth  control options at this time and will consider continue to consider.  Patient has been seen in our clinic previously and can plan close follow-up to consider continue work-up and treatment for menorrhagia.  Recommend continuing with IV blood transfusions.  Would recommend considering outpatient hematology referral for IV iron transfusions after she is discharged.  If bleeding returns would recommend Provera.  Please contact our service if that happens and we can make additional recommendations.  Ieesha Abbasi R Treshon Stannard 02/22/2020, 7:00 PM

## 2020-02-22 NOTE — H&P (Signed)
History and Physical    [name] UTM:[id number] DOB: 1996/01/16 DOA: 02/22/2020  PCP: Patient, No Pcp Per   Patient coming from: Home  I have personally briefly reviewed patient's old medical records in [facility]  Chief Complaint: Weakness  HPI: Hailey Martin is a 24 y.o. female with medical history significant for iron deficiency anemia who presents to the ER for evaluation of abdominal cramps that started about 3 days prior to her presentation associated with her menstrual cycle.  Patient states that her menstrual cycle usually last about 7 days and is usually very heavy with her having to change pads at least every half an hour.  She has had nausea and vomiting associated with the abdominal cramps and has not been able to tolerate any oral intake in 3 days.  She admits to taking ibuprofen to provide relief for her cramps. She complains of feeling weak, dizzy, shortness of breath with mild to moderate exertion as well as chest tightness She denies having any diaphoresis, no palpitations, no changes in her bowel habits, no fever, no chills, no cough She has had significant anemia in the past and has received iron infusions but never blood transfusion. Labs show sodium 140, potassium 3.1, chloride 102, bicarb 25, BUN 12, creatinine 0.4, calcium 9.4, alkaline phosphatase 46, albumin 4.4, lipase 26, AST 17, ALT 13, total protein 7.8, white count 6.0, hemoglobin 5.2, hematocrit 21.3, MCV 54.6, RDW 21.9, platelet count 3 7 Respiratory viral panel is negative  ED Course: Patient is a 24 year old female who presents to the ER for evaluation of weakness, dizziness, lightheadedness associated with exertional shortness of breath and is found to have a hemoglobin of 5.2g/dl.  She has a history of iron deficiency anemia and has had iron infusions in the past.  Anemia still is associated with heavy menstrual bleeding.  ER provider has ordered for 2 units of packed RBC.  Patient will be referred to  observation status for further evaluation  Review of Systems: As per HPI otherwise 10 point review of systems negative.    Past Medical History:  Diagnosis Date  . Allergy   . Anemia   . Delivery of pregnancy by cesarean section 08/11/2018  . Dysmenorrhea   . History of cervicitis 11/26/2012   Chlamydia    . Overweight   . Seborrhea     Past Surgical History:  Procedure Laterality Date  . CESAREAN SECTION N/A 08/29/2015   Procedure: CESAREAN SECTION;  Surgeon: Vena Austria, MD;  Location: [facility] ORS;  Service: Obstetrics;  Laterality: N/A;  . CESAREAN SECTION N/A 01/28/2017   Procedure: CESAREAN SECTION;  Surgeon: Conard Novak, MD;  Location: [facility] ORS;  Service: Obstetrics;  Laterality: N/A;  . CESAREAN SECTION WITH BILATERAL TUBAL LIGATION N/A 08/11/2018   Procedure: CESAREAN SECTION WITH TUBAL LIGATION;  Surgeon: Vena Austria, MD;  Location: [facility] ORS;  Service: Obstetrics;  Laterality: N/A;  . WISDOM TOOTH EXTRACTION       reports that she has never smoked. She has never used smokeless tobacco. She reports current alcohol use of about 1.0 standard drink of alcohol per week. She reports previous drug use. Drug: Marijuana.  No Known Allergies  Family History  Problem Relation Age of Onset  . Heart failure Father 15  . Alcohol abuse Father   . Kidney disease Father   . Liver disease Father   . Thyroid disease Mother   . Breast cancer Paternal Grandmother   . Diabetes Paternal Grandmother   .  Hypertension Paternal Grandmother   . Colon cancer Paternal Grandfather   . Hypertension Paternal Grandfather   . Heart disease Paternal Grandfather      Prior to Admission medications   Not on File    Physical Exam: Vitals:   02/22/20 1424 02/22/20 1515 02/22/20 1530 02/22/20 1642  BP: (!) 107/52 120/66 119/68 122/64  Pulse: 73 67 64 66  Resp: 18 17 15 16   Temp: 98.4 F (36.9 C)   98.5 F (36.9 C)  TempSrc:    Oral  SpO2: 100% 100% 100% 100%  Weight:        Height:         Vitals:   02/22/20 1424 02/22/20 1515 02/22/20 1530 02/22/20 1642  BP: (!) 107/52 120/66 119/68 122/64  Pulse: 73 67 64 66  Resp: 18 17 15 16   Temp: 98.4 F (36.9 C)   98.5 F (36.9 C)  TempSrc:    Oral  SpO2: 100% 100% 100% 100%  Weight:      Height:        Constitutional: NAD, alert and oriented x 3.  Patient appears very pale. Eyes: PERRL, lids and conjunctivae pallor ENMT: Mucous membranes are moist.  Neck: normal, supple, no masses, no thyromegaly Respiratory: clear to auscultation bilaterally, no wheezing, no crackles. Normal respiratory effort. No accessory muscle use.  Cardiovascular: Regular rate and rhythm, no murmurs / rubs / gallops. No extremity edema. 2+ pedal pulses. No carotid bruits.  Abdomen: no tenderness, no masses palpated. No hepatosplenomegaly. Bowel sounds positive.  Musculoskeletal: no clubbing / cyanosis. No joint deformity upper and lower extremities.  Skin: no rashes, lesions, ulcers.  Neurologic: No gross focal neurologic deficit.  Generalized weakness Psychiatric: Normal mood and affect.   Labs on Admission: I have personally reviewed following labs and imaging studies  CBC: Recent Labs  Lab 02/22/20 1426  WBC 6.0  HGB 5.2*  HCT 21.3*  MCV 54.6*  PLT 372   Basic Metabolic Panel: Recent Labs  Lab 02/22/20 1426  NA 140  K 3.1*  CL 102  CO2 25  GLUCOSE 102*  BUN 12  CREATININE 0.44  CALCIUM 9.4   GFR: Estimated Creatinine Clearance: 115.7 mL/min (by C-G formula based on SCr of 0.44 mg/dL). Liver Function Tests: Recent Labs  Lab 02/22/20 1426  AST 17  ALT 13  ALKPHOS 46  BILITOT 0.9  PROT 7.8  ALBUMIN 4.4   Recent Labs  Lab 02/22/20 1426  LIPASE 26   No results for input(s): AMMONIA in the last 168 hours. Coagulation Profile: No results for input(s): INR, PROTIME in the last 168 hours. Cardiac Enzymes: No results for input(s): CKTOTAL, CKMB, CKMBINDEX, TROPONINI in the last 168 hours. BNP (last  3 results) No results for input(s): PROBNP in the last 8760 hours. HbA1C: No results for input(s): HGBA1C in the last 72 hours. CBG: No results for input(s): GLUCAP in the last 168 hours. Lipid Profile: No results for input(s): CHOL, HDL, LDLCALC, TRIG, CHOLHDL, LDLDIRECT in the last 72 hours. Thyroid Function Tests: No results for input(s): TSH, T4TOTAL, FREET4, T3FREE, THYROIDAB in the last 72 hours. Anemia Panel: No results for input(s): VITAMINB12, FOLATE, FERRITIN, TIBC, IRON, RETICCTPCT in the last 72 hours. Urine analysis:    Component Value Date/Time   COLORURINE AMBER (A) 02/22/2020 1423   APPEARANCEUR CLOUDY (A) 02/22/2020 1423   APPEARANCEUR Clear 08/16/2016 1525   LABSPEC 1.026 02/22/2020 1423   PHURINE 6.0 02/22/2020 1423   GLUCOSEU NEGATIVE 02/22/2020 1423  HGBUR LARGE (A) 02/22/2020 1423   BILIRUBINUR NEGATIVE 02/22/2020 1423   BILIRUBINUR negative 11/14/2016 0913   BILIRUBINUR Negative 08/16/2016 1525   KETONESUR 20 (A) 02/22/2020 1423   PROTEINUR >=300 (A) 02/22/2020 1423   UROBILINOGEN 0.2 11/14/2016 0913   NITRITE NEGATIVE 02/22/2020 1423   LEUKOCYTESUR TRACE (A) 02/22/2020 1423    Radiological Exams on Admission: No results found.  EKG: Independently reviewed.   Assessment/Plan Principal Problem:   Acute blood loss anemia Active Problems:   Iron deficiency anemia   Symptomatic anemia    Severe symptomatic anemia Secondary to acute blood loss from menorrhagia Patient presents for evaluation of dizziness, lightheadedness, shortness of breath with mild to moderate exertion and chest tightness She is found to have a hemoglobin of 5.2g/dl with microcytosis Patient has a history of iron deficiency anemia and has received iron infusions in the past We will obtain iron level Patient will receive transfusion of 2 units of packed RBC We will need referral to GYN upon discharge   Hypokalemia Related to GI losses from nausea and vomiting Supplement  potassium Obtain magnesium level  DVT prophylaxis: SCD Code Status: Full code Family Communication: Greater than 50% of time was spent discussing patient's condition and plan of care with her and her mother at the bedside.  All questions and concerns have been addressed.  They verbalized understanding and agree with the plan. Disposition Plan: Back to previous home environment Consults called: GYN    Rayya Yagi MD [facility]     02/22/2020, 4:51 PM

## 2020-02-22 NOTE — ED Triage Notes (Signed)
Pt comes via POV from home with c/o abdominal pain that started 3 days ago. Pt states N/V/D. Pt states heavy bleeding from menstrual cycle which is unsual.

## 2020-02-22 NOTE — ED Provider Notes (Signed)
[facility] Emergency Department Provider Note   ____________________________________________   First MD Initiated Contact with Patient 02/22/20 1514     (approximate)  I have reviewed the triage vital signs and the nursing notes.   HISTORY  Chief Complaint Abdominal Pain    HPI Hailey Martin is a 24 y.o. female with no significant past medical history who presents to the ED complaining of vomiting and lightheadedness. Patient reports that her menses started 3 to 4 days ago and have been very heavy. She states they are typically very heavy but they have been even heavier than usual recently, with her having to change a pad about every 30 minutes. She has started feeling very lightheaded with some difficulty catching her breath and tightness in her chest. There is been associated with some nausea and vomiting, which she states is not typical for her menses. She has had occasional abdominal cramping, but currently denies any abdominal pain. She has not had any dysuria and denies any fevers or cough. She reports being told she is anemic in the past and she has been treated with iron infusions, but she has never had a blood transfusion previously.        Past Medical History:  Diagnosis Date  . Allergy   . Anemia   . Delivery of pregnancy by cesarean section 08/11/2018  . Dysmenorrhea   . History of cervicitis 11/26/2012   Chlamydia    . Overweight   . Seborrhea     Patient Active Problem List   Diagnosis Date Noted  . Acute blood loss anemia 02/22/2020  . Symptomatic anemia 02/22/2020  . Overweight 181 lbs 12/13/2019  . History of bilateral tubal ligation 04/29/2019  . Anemia affecting pregnancy in third trimester 11/17/2016  . Previous cesarean section 09/12/2016  . Iron deficiency anemia 11/08/2014  . Dysmenorrhea 09/07/2014  . Allergic rhinitis, seasonal 09/07/2014  . 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 CESAREAN SECTION N/A 08/29/2015   Procedure: CESAREAN SECTION;  Surgeon: Vena Austria, MD;  Location: [facility] ORS;  Service: Obstetrics;  Laterality: N/A;  . CESAREAN SECTION N/A 01/28/2017   Procedure: CESAREAN SECTION;  Surgeon: Conard Novak, MD;  Location: [facility] ORS;  Service: Obstetrics;  Laterality: N/A;  . CESAREAN SECTION WITH BILATERAL TUBAL LIGATION N/A 08/11/2018   Procedure: CESAREAN SECTION WITH TUBAL LIGATION;  Surgeon: Vena Austria, MD;  Location: [facility] ORS;  Service: Obstetrics;  Laterality: N/A;  . WISDOM TOOTH EXTRACTION      Prior to Admission medications   Not on File    Allergies Patient has no known allergies.  Family History  Problem Relation Age of Onset  . Heart failure Father 46  . Alcohol abuse Father   . Kidney disease Father   . Liver disease Father   . Thyroid disease Mother   . Breast cancer Paternal Grandmother   . Diabetes Paternal Grandmother   . Hypertension Paternal Grandmother   . Colon cancer Paternal Grandfather   . Hypertension Paternal Grandfather   . Heart disease Paternal Grandfather     Social History Social History   Tobacco Use  . Smoking status: Never Smoker  . Smokeless tobacco: Never Used  Vaping Use  . Vaping Use: Never used  Substance Use Topics  . Alcohol use: Yes    Alcohol/week: 1.0 standard drink    Types: 1 Standard drinks or equivalent per week    Comment: 1x/mo with last  use 12/10/19  . Drug use: Not Currently    Types: Marijuana    Review of Systems  Constitutional: No fever/chills. Positive for lightheadedness. Eyes: No visual changes. ENT: No sore throat. Cardiovascular: Positive for chest pain. Respiratory: Positive for shortness of breath. Gastrointestinal: No abdominal pain. Positive for nausea and vomiting.  No diarrhea.  No constipation. Genitourinary: Negative for dysuria. Positive for vaginal bleeding. Musculoskeletal: Negative for back pain. Skin: Negative for  rash. Neurological: Negative for headaches, focal weakness or numbness.  ____________________________________________   PHYSICAL EXAM:  VITAL SIGNS: ED Triage Vitals  Enc Vitals Group     BP 02/22/20 1424 (!) 107/52     Pulse Rate 02/22/20 1424 73     Resp 02/22/20 1424 18     Temp 02/22/20 1424 98.4 F (36.9 C)     Temp src --      SpO2 02/22/20 1424 100 %     Weight 02/22/20 1422 180 lb (81.6 kg)     Height 02/22/20 1422 5\' 6"  (1.676 m)     Head Circumference --      Peak Flow --      Pain Score 02/22/20 1422 7     Pain Loc --      Pain Edu? --      Excl. in GC? --     Constitutional: Alert and oriented. Eyes: Conjunctivae are normal. Head: Atraumatic. Nose: No congestion/rhinnorhea. Mouth/Throat: Mucous membranes are moist. Neck: Normal ROM Cardiovascular: Normal rate, regular rhythm. Grossly normal heart sounds. Respiratory: Normal respiratory effort.  No retractions. Lungs CTAB. Gastrointestinal: Soft and nontender. No distention. Genitourinary: Moderate amount of blood coming from the cervical os with no cervical motion or adnexal tenderness. Musculoskeletal: No lower extremity tenderness nor edema. Neurologic:  Normal speech and language. No gross focal neurologic deficits are appreciated. Skin:  Skin is warm, dry and intact. No rash noted. Psychiatric: Mood and affect are normal. Speech and behavior are normal.  ____________________________________________   LABS (all labs ordered are listed, but only abnormal results are displayed)  Labs Reviewed  WET PREP, GENITAL - Abnormal; Notable for the following components:      Result Value   WBC, Wet Prep HPF POC FEW (*)    All other components within normal limits  COMPREHENSIVE METABOLIC PANEL - Abnormal; Notable for the following components:   Potassium 3.1 (*)    Glucose, Bld 102 (*)    All other components within normal limits  CBC - Abnormal; Notable for the following components:   Hemoglobin 5.2 (*)     HCT 21.3 (*)    MCV 54.6 (*)    MCH 13.3 (*)    MCHC 24.4 (*)    RDW 21.9 (*)    All other components within normal limits  URINALYSIS, COMPLETE (UACMP) WITH MICROSCOPIC - Abnormal; Notable for the following components:   Color, Urine AMBER (*)    APPearance CLOUDY (*)    Hgb urine dipstick LARGE (*)    Ketones, ur 20 (*)    Protein, ur >=300 (*)    Leukocytes,Ua TRACE (*)    RBC / HPF >50 (*)    WBC, UA >50 (*)    All other components within normal limits  IRON AND TIBC - Abnormal; Notable for the following components:   Iron 10 (*)    TIBC 531 (*)    Saturation Ratios 2 (*)    All other components within normal limits  RESP PANEL BY RT-PCR (FLU A&B, COVID) ARPGX2  CHLAMYDIA/NGC RT PCR ([facility] ONLY)  URINE CULTURE  LIPASE, BLOOD  PREGNANCY, URINE  HIV ANTIBODY (ROUTINE TESTING W REFLEX)  MAGNESIUM  BASIC METABOLIC PANEL  CBC  TYPE AND SCREEN  PREPARE RBC (CROSSMATCH)    PROCEDURES  Procedure(s) performed (including Critical Care):  .Critical Care Performed by: Chesley Noon, MD Authorized by: Chesley Noon, MD   Critical care provider statement:    Critical care time (minutes):  45   Critical care time was exclusive of:  Separately billable procedures and treating other patients and teaching time   Critical care was necessary to treat or prevent imminent or life-threatening deterioration of the following conditions:  Circulatory failure   Critical care was time spent personally by me on the following activities:  Discussions with consultants, evaluation of patient's response to treatment, examination of patient, ordering and performing treatments and interventions, ordering and review of laboratory studies, ordering and review of radiographic studies, pulse oximetry, re-evaluation of patient's condition, obtaining history from patient or surrogate and review of old charts   I assumed direction of critical care for this patient from another provider in my  specialty: no       ____________________________________________   INITIAL IMPRESSION / ASSESSMENT AND PLAN / ED COURSE       24 year old female presents to the ED complaining of heavy vaginal bleeding with her menses for the past 3 to 4 days, has since developed lightheadedness, chest tightness, shortness of breath, nausea, and vomiting. She reports occasional abdominal cramping but has no focal abdominal tenderness. Pelvic exam shows moderate amount of bleeding but with no cervical motion adnexal tenderness. Patient reports bleeding has been improving over the past 24 hours. Her shortness of breath and chest tightness is likely related to her anemia given hemoglobin of 5.2. We will transfuse 2 units PRBCs, remainder of labs are reassuring at this time. Pregnancy testing is negative, UA questionable for infection but we will hold off on treatment given she denies any urinary symptoms. Case discussed with hospitalist for admission.      ____________________________________________   FINAL CLINICAL IMPRESSION(S) / ED DIAGNOSES  Final diagnoses:  Symptomatic anemia  Shortness of breath  Non-intractable vomiting with nausea, unspecified vomiting type     ED Discharge Orders    None       Note:  This document was prepared using Dragon voice recognition software and may include unintentional dictation errors.   Chesley Noon, MD 02/22/20 [phone]

## 2020-02-22 NOTE — ED Notes (Signed)
See triage note, pt reports heavy menstrual bleeding and shob x 3 days.  Pt appears shob while at rest, speaking in short sentences.  Pale in color.  Clear speech, alert and oriented.

## 2020-02-23 DIAGNOSIS — R112 Nausea with vomiting, unspecified: Secondary | ICD-10-CM | POA: Diagnosis not present

## 2020-02-23 DIAGNOSIS — D62 Acute posthemorrhagic anemia: Secondary | ICD-10-CM | POA: Diagnosis not present

## 2020-02-23 DIAGNOSIS — D649 Anemia, unspecified: Secondary | ICD-10-CM | POA: Diagnosis not present

## 2020-02-23 DIAGNOSIS — R1110 Vomiting, unspecified: Secondary | ICD-10-CM

## 2020-02-23 DIAGNOSIS — Z419 Encounter for procedure for purposes other than remedying health state, unspecified: Secondary | ICD-10-CM | POA: Diagnosis not present

## 2020-02-23 DIAGNOSIS — R0602 Shortness of breath: Secondary | ICD-10-CM | POA: Diagnosis not present

## 2020-02-23 LAB — BPAM RBC
Blood Product Expiration Date: 202112292359
Blood Product Expiration Date: 202112292359
ISSUE DATE / TIME: 202111301635
ISSUE DATE / TIME: 202111301817
Unit Type and Rh: 6200
Unit Type and Rh: 6200

## 2020-02-23 LAB — CBC
HCT: 27 % — ABNORMAL LOW (ref 36.0–46.0)
Hemoglobin: 7.2 g/dL — ABNORMAL LOW (ref 12.0–15.0)
MCH: 16.7 pg — ABNORMAL LOW (ref 26.0–34.0)
MCHC: 26.7 g/dL — ABNORMAL LOW (ref 30.0–36.0)
MCV: 62.8 fL — ABNORMAL LOW (ref 80.0–100.0)
Platelets: 320 10*3/uL (ref 150–400)
RBC: 4.3 MIL/uL (ref 3.87–5.11)
RDW: 29.9 % — ABNORMAL HIGH (ref 11.5–15.5)
WBC: 6.3 10*3/uL (ref 4.0–10.5)
nRBC: 0 % (ref 0.0–0.2)

## 2020-02-23 LAB — BASIC METABOLIC PANEL
Anion gap: 9 (ref 5–15)
BUN: 14 mg/dL (ref 6–20)
CO2: 26 mmol/L (ref 22–32)
Calcium: 8.5 mg/dL — ABNORMAL LOW (ref 8.9–10.3)
Chloride: 106 mmol/L (ref 98–111)
Creatinine, Ser: 0.42 mg/dL — ABNORMAL LOW (ref 0.44–1.00)
GFR, Estimated: >60 mL/min (ref >60)
Glucose, Bld: 92 mg/dL (ref 70–99)
Potassium: 3.4 mmol/L — ABNORMAL LOW (ref 3.5–5.1)
Sodium: 141 mmol/L (ref 135–145)

## 2020-02-23 LAB — TYPE AND SCREEN
ABO/RH(D): A POS
Antibody Screen: NEGATIVE
Unit division: 0
Unit division: 0

## 2020-02-23 LAB — HIV ANTIBODY (ROUTINE TESTING W REFLEX): HIV Screen 4th Generation wRfx: NONREACTIVE

## 2020-02-23 MED ORDER — FERROUS SULFATE 325 (65 FE) MG PO TABS: 325.0000 mg | ORAL_TABLET | Freq: Every day | ORAL | 0 refills | Status: DC

## 2020-02-23 NOTE — Discharge Instructions (Signed)
Metrorrhagia Metrorrhagia is bleeding from the uterus that happens irregularly but often. The bleeding generally happens between menstrual periods. Follow these instructions at home: Pay attention to any changes in your symptoms. Let your health care provider know about them. Follow these instructions to help with your condition: Eating and drinking   Eat well-balanced meals. Include foods that are high in iron, such as liver, meat, shellfish, green leafy vegetables, and eggs.  If you become constipated: ? Drink plenty of water. Drink enough to keep your urine pale yellow. ? Take over-the-counter or prescription medicines. ? Eat foods that are high in fiber, such as beans, whole grains, and fresh fruits and vegetables. ? Limit foods that are high in fat and processed sugars, such as fried or sweet foods. Medicines  Take over-the-counter and prescription medicines only as told by your health care provider.  Do not change medicines without talking with your health care provider.  Aspirin or medicines that contain aspirin may make the bleeding worse. Do not take these medicines: ? During your period. ? During the week before your period.  If you were prescribed iron pills, take them as told by your health care provider. Iron pills help to replace iron that your body loses because of this condition. Activity  If you need to change your sanitary pad or tampon more than one time every 2 hours: ? Lie in bed with your feet raised (elevated). ? Place a cold pack on your lower abdomen. ? Rest as much as possible until the bleeding stops or slows down. General instructions   For 2 months, write down: ? When your period starts. ? When your period ends. ? When any abnormal bleeding occurs. ? What problems you notice.  Keep all follow-up visits as told by your health care provider. This is important. Contact a health care provider if:  You get light-headed or weak.  You have nausea and  vomiting.  You cannot eat or drink without vomiting.  You feel dizzy or have diarrhea while you are taking medicine.  Have questions about birth control. Get help right away if:  You develop a fever or chills.  You need to change your sanitary pad or tampon more than one time per hour.  Your bleeding becomesheavy.  Your flow contains clots.  You develop pain in your abdomen.  You lose consciousness.  You develop a rash. Summary  Metrorrhagia is bleeding from the uterus that happens irregularly but often, usually between menstrual periods.  Pay attention to any changes in your symptoms. Let your health care provider know about them.  Eat well-balanced meals. Include foods that are high in iron, such as liver, meat, shellfish, green leafy vegetables, and eggs.  Get help right away if you develop a fever, you see clots in your blood, your bleeding becomes heavy, you develop a rash, or you lose consciousness. This information is not intended to replace advice given to you by your health care provider. Make sure you discuss any questions you have with your health care provider. Document Revised: 09/11/2017 Document Reviewed: 09/11/2017 Elsevier Patient Education  2020 [employer]  

## 2020-02-24 ENCOUNTER — Telehealth: Payer: Self-pay

## 2020-02-24 NOTE — Telephone Encounter (Signed)
Transition Care Management Follow-up Telephone Call  Date of discharge and from where: 02/23/2020 [facility]  How have you been since you were released from the hospital? Doing just fine.   Any questions or concerns? No  Items Reviewed:  Did the pt receive and understand the discharge instructions provided? Yes   Medications obtained and verified? Yes   Other? No   Any new allergies since your discharge? No   Dietary orders reviewed? Yes  Do you have support at home? Yes   Home Care and Equipment/Supplies: Were home health services ordered? not applicable If so, what is the name of the agency? na  Has the agency set up a time to come to the patient's home? not applicable Were any new equipment or medical supplies ordered?  No What is the name of the medical supply agency? na Were you able to get the supplies/equipment? not applicable Do you have any questions related to the use of the equipment or supplies? No  Functional Questionnaire: (I = Independent and D = Dependent) ADLs: I  Bathing/Dressing- I  Meal Prep- I  Eating- I  Maintaining continence- I  Transferring/Ambulation- I  Managing Meds- I  Follow up appointments reviewed:   PCP Hospital f/u appt confirmed? No  Patient stated she will set up appointment with a Primary Care office on her own.   Specialist Hospital f/u appt confirmed? Yes  Scheduled to see Christanna Schuman,MD on 03/01/2020 @ 3:30PM.  Are transportation arrangements needed? No   If their condition worsens, is the pt aware to call PCP or go to the Emergency Dept.? Yes  Was the patient provided with contact information for the PCP's office or ED? Yes  Was to pt encouraged to call back with questions or concerns? Yes

## 2020-02-25 LAB — URINE CULTURE: Culture: >=100000 — AB

## 2020-02-26 NOTE — Discharge Summary (Signed)
[facility]   PATIENT NAME: Hailey Martin    MR#:  [id number]  DATE OF BIRTH:  [birth year]  DATE OF ADMISSION:  02/22/2020   ADMITTING PHYSICIAN: Lucile Shutters, MD  DATE OF DISCHARGE: 02/23/2020 10:48 AM  PRIMARY CARE PHYSICIAN: Patient, No Pcp Per   ADMISSION DIAGNOSIS:  Shortness of breath [R06.02] Acute blood loss anemia [D62] Menorrhagia with irregular cycle [N92.1] Symptomatic anemia [D64.9] Non-intractable vomiting with nausea, unspecified vomiting type [R11.2] DISCHARGE DIAGNOSIS:  Principal Problem:   Acute blood loss anemia Active Problems:   Iron deficiency anemia   Symptomatic anemia   Shortness of breath   Non-intractable vomiting  SECONDARY DIAGNOSIS:   Past Medical History:  Diagnosis Date   Allergy    Anemia    Delivery of pregnancy by cesarean section 08/11/2018   Dysmenorrhea    History of cervicitis 11/26/2012   Chlamydia     Overweight    Seborrhea    HOSPITAL COURSE:  24 y.o. female with medical history significant for iron deficiency anemia admitted for evaluation of abdominal cramps that started about 3 days prior to her presentation associated with her menstrual cycle.  Severe symptomatic anemia - Secondary to acute blood loss from menorrhagia Patient presents for evaluation of dizziness, lightheadedness, shortness of breath with mild to moderate exertion and chest tightness She is found to have a hemoglobin of 5.2g/dl with microcytosis Patient has a history of iron deficiency anemia and has received iron infusions in the past s/p transfusion of 2 units of packed RBC with appropriate response. Seen by GYN. Pelvic ultrasound overall normal No evidence of fibroids or polyps.  Thin endometrium. OBGYN Discussed options for management of menorrhagia with the patient including oral birth control pills, extended-cycle birth control pills, Depo-Provera IUD and Nexplanon.  Patient seems most interested in oral birth control options  at this time and planning close follow-up as an outpt with OB.   - She is asked to follow with cancer center as an outpt for iron infusion as need    Hypokalemia: repleted and resolved  Patient requested D/C and is agreeable for outpt f/up with Gyn.   DISCHARGE CONDITIONS:  stable CONSULTS OBTAINED:    DRUG ALLERGIES:  No Known Allergies DISCHARGE MEDICATIONS:   Allergies as of 02/23/2020   No Known Allergies      Medication List     TAKE these medications    ferrous sulfate 325 (65 FE) MG tablet Take 1 tablet (325 mg total) by mouth daily.       DISCHARGE INSTRUCTIONS:   DIET:  Regular diet DISCHARGE CONDITION:  Good ACTIVITY:  Activity as tolerated OXYGEN:  Home Oxygen: No.  Oxygen Delivery: room air DISCHARGE LOCATION:  home   If you experience worsening of your admission symptoms, develop shortness of breath, life threatening emergency, suicidal or homicidal thoughts you must seek medical attention immediately by calling 911 or calling your MD immediately  if symptoms less severe.  You Must read complete instructions/literature along with all the possible adverse reactions/side effects for all the Medicines you take and that have been prescribed to you. Take any new Medicines after you have completely understood and accpet all the possible adverse reactions/side effects.   Please note  You were cared for by a hospitalist during your hospital stay. If you have any questions about your discharge medications or the care you received while you were in the hospital after you are discharged, you can call the unit and asked to  speak with the hospitalist on call if the hospitalist that took care of you is not available. Once you are discharged, your primary care physician will handle any further medical issues. Please note that NO REFILLS for any discharge medications will be authorized once you are discharged, as it is imperative that you return to your primary care  physician (or establish a relationship with a primary care physician if you do not have one) for your aftercare needs so that they can reassess your need for medications and monitor your lab values.    On the day of Discharge:  VITAL SIGNS:  Blood pressure 120/63, pulse 65, temperature 97.7 F (36.5 C), resp. rate 19, height 5\' 6"  (1.676 m), weight 81.6 kg, last menstrual period 02/22/2020, SpO2 100 %, not currently breastfeeding. PHYSICAL EXAMINATION:  GENERAL:  24 y.o.-year-old patient lying in the bed with no acute distress.  EYES: Pupils equal, round, reactive to light and accommodation. No scleral icterus. Extraocular muscles intact.  HEENT: Head atraumatic, normocephalic. Oropharynx and nasopharynx clear.  NECK:  Supple, no jugular venous distention. No thyroid enlargement, no tenderness.  LUNGS: Normal breath sounds bilaterally, no wheezing, rales,rhonchi or crepitation. No use of accessory muscles of respiration.  CARDIOVASCULAR: S1, S2 normal. No murmurs, rubs, or gallops.  ABDOMEN: Soft, non-tender, non-distended. Bowel sounds present. No organomegaly or mass.  EXTREMITIES: No pedal edema, cyanosis, or clubbing.  NEUROLOGIC: Cranial nerves II through XII are intact. Muscle strength 5/5 in all extremities. Sensation intact. Gait not checked.  PSYCHIATRIC: The patient is alert and oriented x 3.  SKIN: No obvious rash, lesion, or ulcer.  DATA REVIEW:   CBC Recent Labs  Lab 02/23/20 0433  WBC 6.3  HGB 7.2*  HCT 27.0*  PLT 320    Chemistries  Recent Labs  Lab 02/22/20 1426 02/22/20 1426 02/22/20 2038 02/23/20 0433  NA 140   < >  --  141  K 3.1*   < >  --  3.4*  CL 102   < >  --  106  CO2 25   < >  --  26  GLUCOSE 102*   < >  --  92  BUN 12   < >  --  14  CREATININE 0.44   < >  --  0.42*  CALCIUM 9.4   < >  --  8.5*  MG  --   --  2.0  --   AST 17  --   --   --   ALT 13  --   --   --   ALKPHOS 46  --   --   --   BILITOT 0.9  --   --   --    < > = values in this  interval not displayed.     Outpatient follow-up  Follow-up Information     14/01/21, MD. Go on 03/01/2020.   Specialty: Obstetrics and Gynecology Why: @ 3:30pm Contact information: [address] [place] [id number]                  Management plans discussed with the patient, family and they are in agreement.  CODE STATUS: Prior   TOTAL TIME TAKING CARE OF THIS PATIENT: 45 minutes.    [phone] M.D on 02/26/2020 at 4:45 PM  [facility]   CC: Primary care physician; Patient, No Pcp Per   Note: This dictation was prepared with Dragon dictation along with smaller phrase technology. Any transcriptional  errors that result from this process are unintentional.

## 2020-03-01 ENCOUNTER — Ambulatory Visit: Payer: Medicaid Other | Admitting: Obstetrics and Gynecology

## 2020-03-25 DIAGNOSIS — Z419 Encounter for procedure for purposes other than remedying health state, unspecified: Secondary | ICD-10-CM | POA: Diagnosis not present

## 2020-04-25 DIAGNOSIS — Z419 Encounter for procedure for purposes other than remedying health state, unspecified: Secondary | ICD-10-CM | POA: Diagnosis not present

## 2020-05-23 DIAGNOSIS — Z419 Encounter for procedure for purposes other than remedying health state, unspecified: Secondary | ICD-10-CM | POA: Diagnosis not present

## 2020-06-23 DIAGNOSIS — Z419 Encounter for procedure for purposes other than remedying health state, unspecified: Secondary | ICD-10-CM | POA: Diagnosis not present

## 2020-07-23 DIAGNOSIS — Z419 Encounter for procedure for purposes other than remedying health state, unspecified: Secondary | ICD-10-CM | POA: Diagnosis not present

## 2020-08-23 DIAGNOSIS — Z419 Encounter for procedure for purposes other than remedying health state, unspecified: Secondary | ICD-10-CM | POA: Diagnosis not present

## 2020-09-05 ENCOUNTER — Encounter: Payer: Self-pay | Admitting: Advanced Practice Midwife

## 2020-09-05 ENCOUNTER — Other Ambulatory Visit: Payer: Self-pay

## 2020-09-05 ENCOUNTER — Ambulatory Visit: Payer: Medicaid Other | Admitting: Advanced Practice Midwife

## 2020-09-05 DIAGNOSIS — Z113 Encounter for screening for infections with a predominantly sexual mode of transmission: Secondary | ICD-10-CM

## 2020-09-05 LAB — WET PREP FOR TRICH, YEAST, CLUE
Trichomonas Exam: NEGATIVE
Yeast Exam: NEGATIVE

## 2020-09-05 NOTE — Progress Notes (Signed)
[facility] Department STI clinic/screening visit  Subjective:  Hailey Martin is a 25 y.o. SBF G3P3 exsmoker female being seen today for an STI screening visit. The patient reports they do have symptoms.  Patient reports that they do not desire a pregnancy in the next year.   They reported they are not interested in discussing contraception today.  Patient's last menstrual period was 08/14/2020.   Patient has the following medical conditions:   Patient Active Problem List   Diagnosis Date Noted   Shortness of breath    Non-intractable vomiting    Acute blood loss anemia 02/22/2020   Symptomatic anemia 02/22/2020   Menorrhagia with irregular cycle    Overweight 181 lbs 12/13/2019   History of bilateral tubal ligation 2020 04/29/2019   Anemia affecting pregnancy in third trimester 11/17/2016   Previous cesarean section 09/12/2016   Iron deficiency anemia 11/08/2014   Dysmenorrhea 09/07/2014   Allergic rhinitis, seasonal 09/07/2014   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/07/2014    Chief Complaint  Patient presents with   SEXUALLY TRANSMITTED DISEASE    screening    HPI  Patient reports c/o white thick clumpy d/c and external vaginal irritation x 5-6 days.  BTL 2020. Last sex 08/31/20 without condom; with current partner x 6 years;1 partner in last 3 mo. Last MJ 07/2016. Last ETOH 09/04/20 (1 beer) 2x/mo. Last cig years ago. Last vaped 5 years ago. LMP 08/14/20  Last HIV test per patient/review of record was 02/22/20 Patient reports last pap was 08/16/16 neg  See flowsheet for further details and programmatic requirements.    The following portions of the patient's history were reviewed and updated as appropriate: allergies, current medications, past medical history, past social history, past surgical history and problem list.  Objective:  There were no vitals filed for this visit.  Physical Exam Vitals and nursing note reviewed.  Constitutional:      Appearance: Normal  appearance. She is obese.  HENT:     Head: Normocephalic and atraumatic.     Mouth/Throat:     Mouth: Mucous membranes are moist.     Pharynx: Oropharynx is clear. No oropharyngeal exudate or posterior oropharyngeal erythema.  Eyes:     Conjunctiva/sclera: Conjunctivae normal.  Pulmonary:     Effort: Pulmonary effort is normal.  Chest:  Breasts:    Right: No axillary adenopathy or supraclavicular adenopathy.     Left: No axillary adenopathy or supraclavicular adenopathy.  Abdominal:     Palpations: Abdomen is soft. There is no mass.     Tenderness: There is no abdominal tenderness. There is no rebound.     Comments: Soft without masses or tenderness  Genitourinary:    General: Normal vulva.     Exam position: Lithotomy position.     Pubic Area: No rash or pubic lice.      Labia:        Right: No rash or lesion.        Left: No rash or lesion.      Vagina: Normal. No vaginal discharge (thick white leukorrhea with sl malodor, ph>4.5), erythema, bleeding or lesions.     Cervix: No cervical motion tenderness, discharge, friability, lesion or erythema.     Uterus: Normal.      Adnexa: Right adnexa normal and left adnexa normal.     Rectum: Normal.  Lymphadenopathy:     Head:     Right side of head: No preauricular or posterior auricular adenopathy.     Left  side of head: No preauricular or posterior auricular adenopathy.     Cervical: No cervical adenopathy.     Upper Body:     Right upper body: No supraclavicular or axillary adenopathy.     Left upper body: No supraclavicular or axillary adenopathy.     Lower Body: No right inguinal adenopathy. No left inguinal adenopathy.  Skin:    General: Skin is warm and dry.     Findings: No rash.  Neurological:     Mental Status: She is alert and oriented to person, place, and time.     Assessment and Plan:  ETHEL MEISENHEIMER is a 25 y.o. female presenting to the [facility] Department for STI screening  1. Screening  examination for venereal disease Treat wet mount per standing orders Immunization nurse consult - WET PREP FOR TRICH, YEAST, CLUE - Chlamydia/Gonorrhea [place] [facility] - Syphilis Serology, [place] [facility] - HIV [place] [facility]     No follow-ups on file.  No future appointments.  Alberteen Spindle, CNM

## 2020-09-05 NOTE — Progress Notes (Signed)
Pt here for STD check.  Wet mount results reiviewed, no medication require.  Pt declined condos. Berdie Ogren, RN

## 2020-09-08 ENCOUNTER — Ambulatory Visit: Payer: Medicaid Other

## 2020-09-08 ENCOUNTER — Telehealth: Payer: Self-pay

## 2020-09-08 ENCOUNTER — Other Ambulatory Visit: Payer: Self-pay

## 2020-09-08 DIAGNOSIS — A549 Gonococcal infection, unspecified: Secondary | ICD-10-CM

## 2020-09-08 DIAGNOSIS — Z113 Encounter for screening for infections with a predominantly sexual mode of transmission: Secondary | ICD-10-CM

## 2020-09-08 MED ORDER — CEFTRIAXONE SODIUM 500 MG IJ SOLR
500.0000 mg | Freq: Once | INTRAMUSCULAR | Status: AC
  Administered 2020-09-08: 16:00:00 500 mg via INTRAMUSCULAR

## 2020-09-08 NOTE — Telephone Encounter (Signed)
Gonorrhea positive from 09/05/2020 visit. Needs treatment. Left message to return call. Will send [employer].

## 2020-09-08 NOTE — Progress Notes (Signed)
In Nurse clinic for gonorrhea treatment. NKA. Treated today with Ceftriaxone per SO Dr Karyl Kinnier. Tolerated well. Agreed to stay for 20 min observation without problem. Jerel Shepherd, RN

## 2020-09-22 DIAGNOSIS — Z419 Encounter for procedure for purposes other than remedying health state, unspecified: Secondary | ICD-10-CM | POA: Diagnosis not present

## 2020-10-23 DIAGNOSIS — Z419 Encounter for procedure for purposes other than remedying health state, unspecified: Secondary | ICD-10-CM | POA: Diagnosis not present

## 2020-11-23 DIAGNOSIS — Z419 Encounter for procedure for purposes other than remedying health state, unspecified: Secondary | ICD-10-CM | POA: Diagnosis not present

## 2020-12-14 ENCOUNTER — Ambulatory Visit: Payer: Medicaid Other | Admitting: Advanced Practice Midwife

## 2020-12-14 ENCOUNTER — Other Ambulatory Visit: Payer: Self-pay

## 2020-12-14 ENCOUNTER — Encounter: Payer: Self-pay | Admitting: Advanced Practice Midwife

## 2020-12-14 DIAGNOSIS — Z113 Encounter for screening for infections with a predominantly sexual mode of transmission: Secondary | ICD-10-CM

## 2020-12-14 LAB — WET PREP FOR TRICH, YEAST, CLUE
Trichomonas Exam: NEGATIVE
Yeast Exam: NEGATIVE

## 2020-12-14 MED ORDER — METRONIDAZOLE 500 MG PO TABS: 500.0000 mg | ORAL_TABLET | Freq: Two times a day (BID) | ORAL | 0 refills | Status: AC

## 2020-12-14 NOTE — Progress Notes (Signed)
Wet prep reviewed during clinic visit. Treatment given for BV during clinic visit and education given.   Floy Sabina, RN

## 2020-12-14 NOTE — Progress Notes (Signed)
[facility] Department STI clinic/screening visit  Subjective:  Hailey Martin is a 25 y.o. SBF nonsmoker G3P3 female being seen today for an STI screening visit. The patient reports they do have symptoms.  Patient reports that they do not desire a pregnancy in the next year.   They reported they are not interested in discussing contraception today.  Patient's last menstrual period was 11/20/2020 (exact date).   Patient has the following medical conditions:   Patient Active Problem List   Diagnosis Date Noted   Shortness of breath    Non-intractable vomiting    Symptomatic anemia 02/22/2020   Menorrhagia with irregular cycle    Overweight 181 lbs 12/13/2019   History of bilateral tubal ligation 2020 04/29/2019   Anemia affecting pregnancy in third trimester 11/17/2016   Previous cesarean section 09/12/2016   Dysmenorrhea 09/07/2014   Allergic rhinitis, seasonal 09/07/2014   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/07/2014    Chief Complaint  Patient presents with   SEXUALLY TRANSMITTED DISEASE    HPI  Patient reports white d/c with irritation and sl itching onset 2 wks ago. Last sex 12/09/20 without condom; with current partner x 6 years; 1 partner in last 3 mo. Last MJ 4 years ago. Last ETOH 10/2020 (1 mixed drink). LMP 11/22/20.  BTL 2020.  Last HIV test per patient/review of record was 02/22/20 Patient reports last pap was 08/16/16 neg  See flowsheet for further details and programmatic requirements.    The following portions of the patient's history were reviewed and updated as appropriate: allergies, current medications, past medical history, past social history, past surgical history and problem list.  Objective:  There were no vitals filed for this visit.  Physical Exam Vitals and nursing note reviewed.  Constitutional:      Appearance: Normal appearance. She is obese.  HENT:     Head: Normocephalic and atraumatic.     Mouth/Throat:     Mouth: Mucous membranes are moist.      Pharynx: Oropharynx is clear. No oropharyngeal exudate or posterior oropharyngeal erythema.  Eyes:     Conjunctiva/sclera: Conjunctivae normal.  Pulmonary:     Effort: Pulmonary effort is normal.  Abdominal:     Palpations: Abdomen is soft. There is no mass.     Tenderness: There is no abdominal tenderness. There is no rebound.     Comments: Poor tone, soft without masses or tenderness  Genitourinary:    General: Normal vulva.     Exam position: Lithotomy position.     Pubic Area: No rash or pubic lice.      Labia:        Right: No rash or lesion.        Left: No rash or lesion.      Vagina: Vaginal discharge (large amt creamy white leukorrhea, ph>4.5) present. No erythema, bleeding or lesions.     Cervix: Normal.     Uterus: Normal.      Adnexa: Right adnexa normal and left adnexa normal.     Rectum: Normal.  Lymphadenopathy:     Head:     Right side of head: No preauricular or posterior auricular adenopathy.     Left side of head: No preauricular or posterior auricular adenopathy.     Cervical: No cervical adenopathy.     Upper Body:     Right upper body: No supraclavicular or axillary adenopathy.     Left upper body: No supraclavicular or axillary adenopathy.     Lower Body: No  right inguinal adenopathy. No left inguinal adenopathy.  Skin:    General: Skin is warm and dry.     Findings: No rash.  Neurological:     Mental Status: She is alert and oriented to person, place, and time.     Assessment and Plan:  Hailey Martin is a 25 y.o. female presenting to the [facility] Department for STI screening  1. Screening examination for venereal disease Treat wet mount per standing orders Immunization nurse consult - WET PREP FOR TRICH, YEAST, CLUE - Chlamydia/Gonorrhea [place] [facility]     No follow-ups on file.  No future appointments.  Alberteen Spindle, CNM

## 2020-12-14 NOTE — Addendum Note (Signed)
Addended by: Floy Sabina on: 12/14/2020 12:00 PM   Modules accepted: Orders

## 2020-12-23 DIAGNOSIS — Z419 Encounter for procedure for purposes other than remedying health state, unspecified: Secondary | ICD-10-CM | POA: Diagnosis not present

## 2020-12-25 ENCOUNTER — Encounter: Payer: Self-pay | Admitting: Advanced Practice Midwife

## 2020-12-25 ENCOUNTER — Ambulatory Visit: Payer: Medicaid Other | Admitting: Advanced Practice Midwife

## 2020-12-25 ENCOUNTER — Other Ambulatory Visit: Payer: Self-pay

## 2020-12-25 DIAGNOSIS — Z113 Encounter for screening for infections with a predominantly sexual mode of transmission: Secondary | ICD-10-CM

## 2020-12-25 DIAGNOSIS — B3731 Acute candidiasis of vulva and vagina: Secondary | ICD-10-CM

## 2020-12-25 LAB — WET PREP FOR TRICH, YEAST, CLUE: Trichomonas Exam: NEGATIVE

## 2020-12-25 MED ORDER — CLOTRIMAZOLE 1 % VA CREA: 1.0000 | TOPICAL_CREAM | Freq: Every day | VAGINAL | 0 refills | Status: AC

## 2020-12-25 NOTE — Progress Notes (Signed)
Wet mount reviewed and treated for yeast per standing order. Sidda Humm, RN  

## 2020-12-25 NOTE — Progress Notes (Signed)
[facility] Department STI clinic/screening visit  Subjective:  Hailey Martin is a 25 y.o. SBF G3P3 nonsmoker female being seen today for an STI screening visit. The patient reports they do have symptoms.  Patient reports that they do not desire a pregnancy in the next year.   They reported they are not interested in discussing contraception today.  Patient's last menstrual period was 12/21/2020 (exact date).   Patient has the following medical conditions:   Patient Active Problem List   Diagnosis Date Noted   Morbid obesity (HCC) 180 lbs 12/25/2020   Shortness of breath    Non-intractable vomiting    Symptomatic anemia 02/22/2020   Menorrhagia with irregular cycle    Overweight 181 lbs 12/13/2019   History of bilateral tubal ligation 2020 04/29/2019   Anemia affecting pregnancy in third trimester 11/17/2016   Previous cesarean section 09/12/2016   Dysmenorrhea 09/07/2014   Allergic rhinitis, seasonal 09/07/2014   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/07/2014    Chief Complaint  Patient presents with   SEXUALLY TRANSMITTED DISEASE    HPI  Patient reports c/o itching and burning with thick white d/c since tx of BV on 12/14/20 with antibiotics.  BTL 2020.  Last sex 12/05/20 without condom; with current partner x 6 years; 1 partner in last 3 mo. LMP 12/21/20. Last ETOH 3 wks ago (1 [employer] [place]) on "special occassions".  Last HIV test per patient/review of record was 09/05/20 Patient reports last pap was 08/16/16 neg  See flowsheet for further details and programmatic requirements.    The following portions of the patient's history were reviewed and updated as appropriate: allergies, current medications, past medical history, past social history, past surgical history and problem list.  Objective:  There were no vitals filed for this visit.  Physical Exam Vitals and nursing note reviewed.  Constitutional:      Appearance: Normal appearance. She is obese.  HENT:     Head:  Normocephalic and atraumatic.     Mouth/Throat:     Mouth: Mucous membranes are moist.     Pharynx: Oropharynx is clear. No oropharyngeal exudate or posterior oropharyngeal erythema.  Eyes:     Conjunctiva/sclera: Conjunctivae normal.  Pulmonary:     Effort: Pulmonary effort is normal.  Abdominal:     General: Abdomen is flat.     Palpations: Abdomen is soft. There is no mass.     Tenderness: There is no abdominal tenderness. There is no rebound.     Comments: Soft without masses or tenderness  Genitourinary:    General: Normal vulva.     Exam position: Lithotomy position.     Pubic Area: No rash or pubic lice.      Labia:        Right: No rash or lesion.        Left: No rash or lesion.      Vagina: Normal. No vaginal discharge (white thick curdy leukorrhea, ph<4.5), erythema, bleeding or lesions.     Cervix: Normal.     Uterus: Normal.      Adnexa: Right adnexa normal and left adnexa normal.     Rectum: Normal.  Lymphadenopathy:     Head:     Right side of head: No preauricular or posterior auricular adenopathy.     Left side of head: No preauricular or posterior auricular adenopathy.     Cervical: No cervical adenopathy.     Upper Body:     Right upper body: No supraclavicular or axillary  adenopathy.     Left upper body: No supraclavicular or axillary adenopathy.     Lower Body: No right inguinal adenopathy. No left inguinal adenopathy.  Skin:    General: Skin is warm and dry.     Findings: No rash.  Neurological:     Mental Status: She is alert and oriented to Martin, place, and time.     Assessment and Plan:  Hailey Martin is a 25 y.o. female presenting to the [facility] Department for STI screening  1. Morbid obesity (HCC) 180 lbs   2. Screening examination for venereal disease Treat wet mount per standing orders Immunization nurse consult - WET PREP FOR TRICH, YEAST, CLUE - Chlamydia/Gonorrhea [place] [facility]     No follow-ups on file.  No  future appointments.  Alberteen Spindle, CNM

## 2021-01-17 ENCOUNTER — Other Ambulatory Visit: Payer: Self-pay

## 2021-01-17 ENCOUNTER — Ambulatory Visit: Payer: Medicaid Other | Admitting: Physician Assistant

## 2021-01-17 DIAGNOSIS — Z299 Encounter for prophylactic measures, unspecified: Secondary | ICD-10-CM

## 2021-01-17 DIAGNOSIS — Z113 Encounter for screening for infections with a predominantly sexual mode of transmission: Secondary | ICD-10-CM

## 2021-01-17 LAB — WET PREP FOR TRICH, YEAST, CLUE: Trichomonas Exam: NEGATIVE

## 2021-01-17 MED ORDER — CLOTRIMAZOLE 1 % VA CREA: 1.0000 | TOPICAL_CREAM | Freq: Every day | VAGINAL | 0 refills | Status: DC

## 2021-01-17 NOTE — Progress Notes (Signed)
S:  Patient into clinic requesting recheck to make sure that BV and Yeast have cleared.  States that she was here at the end of last month and diagnosed with BV, then was here on 12/25/2020 and treated for yeast.  Denies symptoms and any changes to her history since last visit. O:  WDWN female in NAD , A&O x 3, normal work of breathing; pelvic=External genitalia/pubic area without nits, lice, edema, erythema, lesions and inguinal adenopathy. Vagina with normal mucosa and discharge. Cervix without visible lesions. Uterus firm, mobile, nt, no masses, no CMT, no adnexal tenderness or fullness.  Wet mount= negative for clue, amine and Trich; few yeast present. A/P:  1.  Patient is without symptoms but has few yeast on wet mount. 2.  Counseled patient that since she is asymptomatic she does not need to have any treatment. 3.  Patient insists on treatment today. 4.  Clotrimazole 1 % vaginal cream given to patient to use 1 app qhs for 7 days. 5.  No sex for 10 days. 6.  Condoms enc with all sex. 7. RTC prn.

## 2021-01-17 NOTE — Addendum Note (Signed)
Addended by: Sadie Haber on: 01/17/2021 05:51 PM   Modules accepted: Orders

## 2021-01-23 DIAGNOSIS — Z419 Encounter for procedure for purposes other than remedying health state, unspecified: Secondary | ICD-10-CM | POA: Diagnosis not present

## 2021-01-27 ENCOUNTER — Encounter: Payer: Self-pay | Admitting: Emergency Medicine

## 2021-01-27 ENCOUNTER — Emergency Department
Admission: EM | Admit: 2021-01-27 | Discharge: 2021-01-28 | Disposition: A | Discharge status: Home / Self Care | Payer: Medicaid Other | Attending: Emergency Medicine | Admitting: Emergency Medicine

## 2021-01-27 ENCOUNTER — Other Ambulatory Visit: Payer: Self-pay

## 2021-01-27 DIAGNOSIS — M791 Myalgia, unspecified site: Secondary | ICD-10-CM | POA: Insufficient documentation

## 2021-01-27 DIAGNOSIS — R6883 Chills (without fever): Secondary | ICD-10-CM | POA: Insufficient documentation

## 2021-01-27 DIAGNOSIS — Z5321 Procedure and treatment not carried out due to patient leaving prior to being seen by health care provider: Secondary | ICD-10-CM | POA: Diagnosis not present

## 2021-01-27 DIAGNOSIS — R0981 Nasal congestion: Secondary | ICD-10-CM | POA: Diagnosis not present

## 2021-01-27 DIAGNOSIS — R059 Cough, unspecified: Secondary | ICD-10-CM | POA: Insufficient documentation

## 2021-01-27 NOTE — ED Notes (Signed)
No answer in lobby or outside x3

## 2021-01-27 NOTE — ED Triage Notes (Signed)
Pt via POV from home. Pt c/o cough, nasal congestions, body aches, and chills. Pt has not been tested. Pt is A&OX4 and NAD.

## 2021-01-28 ENCOUNTER — Ambulatory Visit
Admission: EM | Admit: 2021-01-28 | Discharge: 2021-01-28 | Disposition: A | Discharge status: Home / Self Care | Payer: Medicaid Other | Attending: Emergency Medicine | Admitting: Emergency Medicine

## 2021-01-28 ENCOUNTER — Encounter: Payer: Self-pay | Admitting: Emergency Medicine

## 2021-01-28 DIAGNOSIS — R0602 Shortness of breath: Secondary | ICD-10-CM | POA: Insufficient documentation

## 2021-01-28 DIAGNOSIS — R051 Acute cough: Secondary | ICD-10-CM | POA: Insufficient documentation

## 2021-01-28 DIAGNOSIS — D509 Iron deficiency anemia, unspecified: Secondary | ICD-10-CM | POA: Diagnosis not present

## 2021-01-28 DIAGNOSIS — T454X6A Underdosing of iron and its compounds, initial encounter: Secondary | ICD-10-CM | POA: Diagnosis not present

## 2021-01-28 DIAGNOSIS — J069 Acute upper respiratory infection, unspecified: Secondary | ICD-10-CM

## 2021-01-28 DIAGNOSIS — Z91128 Patient's intentional underdosing of medication regimen for other reason: Secondary | ICD-10-CM | POA: Diagnosis not present

## 2021-01-28 DIAGNOSIS — Z20822 Contact with and (suspected) exposure to covid-19: Secondary | ICD-10-CM | POA: Insufficient documentation

## 2021-01-28 DIAGNOSIS — R197 Diarrhea, unspecified: Secondary | ICD-10-CM | POA: Diagnosis present

## 2021-01-28 DIAGNOSIS — D649 Anemia, unspecified: Secondary | ICD-10-CM

## 2021-01-28 LAB — RAPID INFLUENZA A&B ANTIGENS
Influenza A (ARMC): NEGATIVE
Influenza B (ARMC): NEGATIVE

## 2021-01-28 MED ORDER — IPRATROPIUM BROMIDE 0.06 % NA SOLN: 2.0000 | Freq: Four times a day (QID) | NASAL | 12 refills | Status: DC

## 2021-01-28 MED ORDER — BENZONATATE 100 MG PO CAPS: 200.0000 mg | ORAL_CAPSULE | Freq: Three times a day (TID) | ORAL | 0 refills | Status: DC

## 2021-01-28 NOTE — ED Provider Notes (Signed)
[facility]    CSN: [id number] Arrival date & time: 01/28/21  0806      History   Chief Complaint Chief Complaint  Patient presents with   Shortness of Breath   Diarrhea   Cough    HPI Hailey Martin is a 25 y.o. female.   HPI  26 year old female here for evaluation of respiratory complaints.  Patient reports that for last 3 days she has been experiencing runny nose and nasal congestion with clear nasal discharge, cough that is productive for green sputum, shortness of breath, and chills.  She denies any fever, ear pressure, sore throat, or fatigue.  Patient is extremely pale and has a history of iron deficiency anemia.  She states that she has not been taking her iron as she was instructed in the last time she felt this way she needed 2 units of blood for low H&H.  Patient's last CBC was from December 2021 which showed an H&H of 7.2 and 27 following 2 units of packed red cells that were transfused 2 days prior.  Patient does not have a primary care provider and is not being followed by hematology oncology at present.  She has been followed by [facility] peds heme-onc in the past for her anemia.  Patient reports her symptoms usually get worse around the time of her menstrual cycle but she is not on her menstrual cycle at present.  She states that sometimes she has to lean forward because she feels like she cannot get in enough air.  Past Medical History:  Diagnosis Date   Allergy    Anemia    Delivery of pregnancy by cesarean section 08/11/2018   Dysmenorrhea    History of cervicitis 11/26/2012   Chlamydia     Overweight    Seborrhea     Patient Active Problem List   Diagnosis Date Noted   Morbid obesity ([place]) 180 lbs 12/25/2020   Shortness of breath    Non-intractable vomiting    Symptomatic anemia 02/22/2020   Menorrhagia with irregular cycle    Overweight 181 lbs 12/13/2019   History of bilateral tubal ligation 2020 04/29/2019   Anemia affecting pregnancy in third  trimester 11/17/2016   Previous cesarean section 09/12/2016   Dysmenorrhea 09/07/2014   Allergic rhinitis, seasonal 09/07/2014   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/07/2014    Past Surgical History:  Procedure Laterality Date   CESAREAN SECTION N/A 08/29/2015   Procedure: CESAREAN SECTION;  Surgeon: Malachy Mood, MD;  Location: [facility] ORS;  Service: Obstetrics;  Laterality: N/A;   CESAREAN SECTION N/A 01/28/2017   Procedure: CESAREAN SECTION;  Surgeon: Will Bonnet, MD;  Location: [facility] ORS;  Service: Obstetrics;  Laterality: N/A;   CESAREAN SECTION WITH BILATERAL TUBAL LIGATION N/A 08/11/2018   Procedure: CESAREAN SECTION WITH TUBAL LIGATION;  Surgeon: Malachy Mood, MD;  Location: [facility] ORS;  Service: Obstetrics;  Laterality: N/A;   WISDOM TOOTH EXTRACTION      OB History     Gravida  3   Para  3   Term  3   Preterm  0   AB  0   Living  3      SAB  0   IAB  0   Ectopic  0   Multiple  0   Live Births  3        Obstetric Comments  LTCS for placental abruption          Home Medications    Prior to Admission medications  Medication Sig Start Date End Date Taking? Authorizing Provider  benzonatate (TESSALON) 100 MG capsule Take 2 capsules (200 mg total) by mouth every 8 (eight) hours. 01/28/21  Yes Margarette Canada, NP  ferrous sulfate 325 (65 FE) MG tablet Take 1 tablet (325 mg total) by mouth daily. 02/23/20 01/28/21 Yes Max Sane, MD  ipratropium (ATROVENT) 0.06 % nasal spray Place 2 sprays into both nostrils 4 (four) times daily. 01/28/21  Yes Margarette Canada, NP  clotrimazole (CLOTRIMAZOLE-7) 1 % vaginal cream Place 1 Applicatorful vaginally at bedtime. 01/17/21   Jerene Dilling, PA    Family History Family History  Problem Relation Age of Onset   Heart failure Father 86   Alcohol abuse Father    Kidney disease Father    Liver disease Father    Thyroid disease Mother    Breast cancer Paternal Grandmother    Diabetes Paternal Grandmother    Hypertension  Paternal Grandmother    Colon cancer Paternal Grandfather    Hypertension Paternal Grandfather    Heart disease Paternal Grandfather     Social History Social History   Tobacco Use   Smoking status: Former    Types: Cigarettes, E-cigarettes   Smokeless tobacco: Never  Vaping Use   Vaping Use: Never used  Substance Use Topics   Alcohol use: Yes    Alcohol/week: 1.0 standard drink    Types: 1 Standard drinks or equivalent per week    Comment: last use 11/2020   Drug use: Not Currently    Types: Marijuana    Comment: last use 4 years ago     Allergies   Patient has no known allergies.   Review of Systems Review of Systems  Constitutional:  Negative for activity change, appetite change, fatigue and fever.  HENT:  Positive for congestion and rhinorrhea. Negative for ear pain and sore throat.   Respiratory:  Positive for cough and shortness of breath. Negative for wheezing.   Gastrointestinal:  Positive for diarrhea, nausea and vomiting.  Skin:  Positive for pallor.  Neurological:  Negative for syncope.  Hematological: Negative.   Psychiatric/Behavioral: Negative.      Physical Exam Triage Vital Signs ED Triage Vitals  Enc Vitals Group     BP 01/28/21 0821 117/76     Pulse Rate 01/28/21 0821 94     Resp 01/28/21 0821 14     Temp 01/28/21 0821 98 F (36.7 C)     Temp Source 01/28/21 0821 Oral     SpO2 01/28/21 0821 100 %     Weight 01/28/21 0817 180 lb (81.6 kg)     Height 01/28/21 0817 5\' 7"  (1.702 m)     Head Circumference --      Peak Flow --      Pain Score 01/28/21 0817 6     Pain Loc --      Pain Edu? --      Excl. in [place]? --    No data found.  Updated Vital Signs BP 117/76 (BP Location: Right Arm)   Pulse 94   Temp 98 F (36.7 C) (Oral)   Resp 14   Ht 5\' 7"  (1.702 m)   Wt 180 lb (81.6 kg)   LMP 01/03/2021 (Exact Date)   SpO2 100%   BMI 28.19 kg/m   Visual Acuity Right Eye Distance:   Left Eye Distance:   Bilateral Distance:    Right Eye  Near:   Left Eye Near:    Bilateral Near:  Physical Exam Vitals and nursing note reviewed.  Constitutional:      General: She is not in acute distress.    Appearance: Normal appearance. She is ill-appearing.  HENT:     Head: Normocephalic and atraumatic.     Right Ear: Tympanic membrane, ear canal and external ear normal. There is no impacted cerumen.     Left Ear: Tympanic membrane, ear canal and external ear normal. There is no impacted cerumen.     Nose: Congestion and rhinorrhea present.     Comments: His mucosa is mildly erythematous, edematous, with clear nasal discharge in both ears.    Mouth/Throat:     Mouth: Mucous membranes are moist.     Pharynx: Oropharynx is clear. No posterior oropharyngeal erythema.     Comments: Or mucous membranes are pale. Eyes:     Extraocular Movements: Extraocular movements intact.     Pupils: Pupils are equal, round, and reactive to light.     Comments: Bilateral conjunctiva are pale.  Cardiovascular:     Rate and Rhythm: Normal rate and regular rhythm.     Pulses: Normal pulses.     Heart sounds: Normal heart sounds. No murmur heard.   No gallop.  Pulmonary:     Effort: Pulmonary effort is normal.     Breath sounds: Normal breath sounds. No wheezing, rhonchi or rales.  Musculoskeletal:     Cervical back: Normal range of motion and neck supple.  Lymphadenopathy:     Cervical: No cervical adenopathy.  Skin:    General: Skin is warm and dry.     Coloration: Skin is pale.     Comments: Patient's lips and nailbeds are pale.  Difficult to determine cap refill.  Neurological:     General: No focal deficit present.     Mental Status: She is alert and oriented to person, place, and time.  Psychiatric:        Mood and Affect: Mood normal.        Behavior: Behavior normal.        Thought Content: Thought content normal.        Judgment: Judgment normal.     UC Treatments / Results  Labs (all labs ordered are listed, but only  abnormal results are displayed) Labs Reviewed  RAPID INFLUENZA A&B ANTIGENS  SARS CORONAVIRUS 2 (TAT 6-24 HRS)    EKG   Radiology No results found.  Procedures Procedures (including critical care time)  Medications Ordered in UC Medications - No data to display  Initial Impression / Assessment and Plan / UC Course  I have reviewed the triage vital signs and the nursing notes.  Pertinent labs & imaging results that were available during my care of the patient were reviewed by me and considered in my medical decision making (see chart for details).  Nontoxic though ill-appearing 25 year old female here for evaluation of respiratory complaints that includes shortness of breath.  She denies fatigue or fever.  She does endorse runny nose and nasal congestion for clear nasal discharge and a cough that is intermittently productive for green sputum.  Her shortness of breath is disproportionate to her respiratory symptoms.  Patient does have a history of iron deficiency anemia which has required transfusion in the past.  She is post be taking iron but reports that she does not take iron as she is supposed to.  She has recently started back on her iron and has not experienced any improvement of her shortness of breath symptoms.  Patient's physical exam revealed prograde tympanic membranes bilaterally with normal light reflex and clear external auditory canals.  Nasal mucosa is mildly erythematous, edematous, and with clear nasal discharge in both nares.  Oral mucosa is pale but moist.  Patient's lips and conjunctiva are also pale.  There is no color in the patient's nailbeds and is difficult to obtain capillary refill.  Patient denies any syncopal episodes.  Patient was swabbed for influenza and COVID and influenza swab is negative.  COVID is pending.  I believe that patient has a viral respiratory illness that is being compounded by symptomatic anemia.  I have advised the patient that we are unable to  transfuse her here or check her iron level.  I did did offer to check a CBC but reported that I would not be able to act on it I would still have to send her to the ER if she was low as I expected.  Patient has elected to go by POV to [facility] for evaluation of her symptomatic anemia.   Final Clinical Impressions(s) / UC Diagnoses   Final diagnoses:  Viral URI with cough  Anemia, unspecified type     Discharge Instructions      Use the Atrovent nasal spray, 2 squirts in each nostril every 6 hours, as needed for runny nose and postnasal drip.  Use the Tessalon Perles every 8 hours during the day.  Take them with a small sip of water.  They may give you some numbness to the base of your tongue or a metallic taste in your mouth, this is normal.  Please go to the ER at [facility] to have your blood count checked and possible transfusion for your anemia.   Return for reevaluation or see your primary care provider for any new or worsening symptoms.      ED Prescriptions     Medication Sig Dispense Auth. Provider   ipratropium (ATROVENT) 0.06 % nasal spray Place 2 sprays into both nostrils 4 (four) times daily. 15 mL Becky Augusta, NP   benzonatate (TESSALON) 100 MG capsule Take 2 capsules (200 mg total) by mouth every 8 (eight) hours. 21 capsule Becky Augusta, NP      PDMP not reviewed this encounter.   Becky Augusta, NP 01/28/21 [id number]

## 2021-01-28 NOTE — Discharge Instructions (Signed)
Use the Atrovent nasal spray, 2 squirts in each nostril every 6 hours, as needed for runny nose and postnasal drip.  Use the Tessalon Perles every 8 hours during the day.  Take them with a small sip of water.  They may give you some numbness to the base of your tongue or a metallic taste in your mouth, this is normal.  Please go to the ER at [facility] to have your blood count checked and possible transfusion for your anemia.   Return for reevaluation or see your primary care provider for any new or worsening symptoms.

## 2021-01-28 NOTE — ED Notes (Signed)
Patient is being discharged from the Urgent Care and sent to the [facility] Emergency Department via private vehicle . Per Becky Augusta, NP, patient is in need of higher level of care due to possible anemia. Patient is aware and verbalizes understanding of plan of care.  Vitals:   01/28/21 0821  BP: 117/76  Pulse: 94  Resp: 14  Temp: 98 F (36.7 C)  SpO2: 100%

## 2021-01-28 NOTE — ED Triage Notes (Signed)
Patient c/o cough, congestion, SOB, chills for the past 3 days.  Patient was in the ED yesterday but left due to the wait.  Patient denies fevers.

## 2021-01-29 LAB — SARS CORONAVIRUS 2 (TAT 6-24 HRS): SARS Coronavirus 2: NEGATIVE

## 2021-02-22 DIAGNOSIS — Z419 Encounter for procedure for purposes other than remedying health state, unspecified: Secondary | ICD-10-CM | POA: Diagnosis not present

## 2021-03-25 DIAGNOSIS — Z419 Encounter for procedure for purposes other than remedying health state, unspecified: Secondary | ICD-10-CM | POA: Diagnosis not present

## 2021-04-25 DIAGNOSIS — Z419 Encounter for procedure for purposes other than remedying health state, unspecified: Secondary | ICD-10-CM | POA: Diagnosis not present

## 2021-05-15 ENCOUNTER — Ambulatory Visit: Payer: Medicaid Other

## 2021-05-16 ENCOUNTER — Ambulatory Visit: Payer: Medicaid Other

## 2021-05-23 DIAGNOSIS — Z419 Encounter for procedure for purposes other than remedying health state, unspecified: Secondary | ICD-10-CM | POA: Diagnosis not present

## 2021-05-28 ENCOUNTER — Other Ambulatory Visit: Payer: Self-pay

## 2021-05-28 ENCOUNTER — Ambulatory Visit: Payer: Medicaid Other | Admitting: Nurse Practitioner

## 2021-05-28 ENCOUNTER — Encounter: Payer: Self-pay | Admitting: Nurse Practitioner

## 2021-05-28 DIAGNOSIS — Z113 Encounter for screening for infections with a predominantly sexual mode of transmission: Secondary | ICD-10-CM

## 2021-05-28 LAB — WET PREP FOR TRICH, YEAST, CLUE
Trichomonas Exam: NEGATIVE
Yeast Exam: NEGATIVE

## 2021-05-28 NOTE — Progress Notes (Signed)
Pt here for STD screening.  Wet mount reviewed, no treatment required per Provider.  Condoms declined.  Berdie Ogren, RN ? ?

## 2021-05-28 NOTE — Progress Notes (Signed)
[facility] Department ? ?STI clinic/screening visit ?[address] ?[place] [address] ?[phone] ? ?Subjective:  ?Hailey Martin is a 26 y.o. female being seen today for an STI screening visit. The patient reports they do have symptoms.  Patient reports that they do not desire a pregnancy in the next year.   They reported they are not interested in discussing contraception today.   ? ?Patient's last menstrual period was 05/19/2021 (approximate). ? ? ?Patient has the following medical conditions:   ?Patient Active Problem List  ? Diagnosis Date Noted  ? Morbid obesity ([facility]) 180 lbs 12/25/2020  ? Shortness of breath   ? Non-intractable vomiting   ? Symptomatic anemia 02/22/2020  ? Menorrhagia with irregular cycle   ? Overweight 181 lbs 12/13/2019  ? History of bilateral tubal ligation 2020 04/29/2019  ? Anemia affecting pregnancy in third trimester 11/17/2016  ? Previous cesarean section 09/12/2016  ? Dysmenorrhea 09/07/2014  ? Allergic rhinitis, seasonal 09/07/2014  ? 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 ? ?Chief Complaint  ?Patient presents with  ? SEXUALLY TRANSMITTED DISEASE  ?  Screening  ? ? ?HPI ? ?Patient reports to clinic today for STD screening.  Patient reports discharge and some vaginal irritation for 3 weeks  ? ?Last HIV test per patient/review of record was 09/05/2020 ?Patient reports last pap was 3 years ago.  ? ?Screening for MPX risk: ?Does the patient have an unexplained rash? No ?Is the patient MSM? No ?Does the patient endorse multiple sex partners or anonymous sex partners? No ?Did the patient have close or sexual contact with a person diagnosed with MPX? No ?Has the patient traveled outside the [place] where MPX is endemic? No ?Is there a high clinical suspicion for MPX-- evidenced by one of the following No ? -Unlikely to be chickenpox ? -Lymphadenopathy ? -Rash that present in same phase of evolution on any given body part ?See flowsheet for further details and programmatic  requirements.  ? ? ?The following portions of the patient's history were reviewed and updated as appropriate: allergies, current medications, past medical history, past social history, past surgical history and problem list. ? ?Objective:  ?There were no vitals filed for this visit. ? ?Physical Exam ?Constitutional:   ?   Appearance: Normal appearance.  ?HENT:  ?   Head: Normocephalic.  ?   Right Ear: External ear normal.  ?   Left Ear: External ear normal.  ?   Nose: Nose normal.  ?   Mouth/Throat:  ?   Mouth: Mucous membranes are moist.  ?   Comments: No visible dental caries noted.  Last dental exam couple of years ago.  ?Eyes:  ?   Pupils: Pupils are equal, round, and reactive to light.  ?Pulmonary:  ?   Effort: Pulmonary effort is normal.  ?Abdominal:  ?   General: Abdomen is flat.  ?   Palpations: Abdomen is soft.  ?Genitourinary: ?   Comments: External genitalia/pubic area without nits, lice, edema, erythema, lesions and inguinal adenopathy. ?Vagina with normal mucosa and discharge. ?Cervix without visible lesions. ?Uterus firm, mobile, nt, no masses, no CMT, no adnexal tenderness or fullness. pH 4.5. ?Musculoskeletal:  ?   Cervical back: Full passive range of motion without pain, normal range of motion and neck supple.  ?Skin: ?   General: Skin is warm and dry.  ?Neurological:  ?   Mental Status: She is alert and oriented to person, place, and time.  ?Psychiatric:     ?  Attention and Perception: Attention normal.     ?   Mood and Affect: Mood normal.     ?   Speech: Speech normal.     ?   Behavior: Behavior is cooperative.  ? ? ? ?Assessment and Plan:  ?Hailey Martin is a 26 y.o. female presenting to the [facility] Department for STI screening ? ?1. Screening examination for venereal disease ?-25 year old female in clinic today for STD screening. ?-Patient accepted all screenings including vaginal CT/GC and declines bloodwork for HIV/RPR.  ?Patient meets criteria for HepB screening? No.  Ordered? No - low risk  ?Patient meets criteria for HepC screening? No. Ordered? No - low risk  ? ?Treat wet prep per standing order ?Discussed time line for [facility] results and that patient will be called with positive results and encouraged patient to call if she had not heard in 2 weeks.  ?Counseled to return or seek care for continued or worsening symptoms ?Recommended condom use with all sex ? ?Patient has a history of tubal ligation to prevent pregnancy.   ?- Chlamydia/Gonorrhea [facility] ?- WET PREP FOR TRICH, YEAST, CLUE ? ? ? ? ?Return if symptoms worsen or fail to improve. ? ?No future appointments. ? ?Glenna Fellows, FNP ?

## 2021-06-04 ENCOUNTER — Telehealth: Payer: Self-pay | Admitting: Family Medicine

## 2021-06-04 NOTE — Telephone Encounter (Signed)
Pt would like to speak with Nurse about test results. ?

## 2021-06-06 NOTE — Progress Notes (Signed)
06-06-2021 Received a request from [facility] for medical records on patient Hailey Martin, 1995 for dates of service 09-05-2020 to 12-25-2020. [employer] is making this request for the annual HEDIS audit. Per request, copies of office visit notes for 09-05-2020, 12-14-2020 and 12-25-2020 all mailed to [facility] [place], [address] [place], [place], [place] [address]. Records mailed certified number: [phone]. Herby Abraham RN.   ?

## 2021-06-18 ENCOUNTER — Other Ambulatory Visit: Payer: Self-pay

## 2021-06-18 ENCOUNTER — Encounter: Payer: Self-pay | Admitting: Obstetrics and Gynecology

## 2021-06-18 ENCOUNTER — Ambulatory Visit (INDEPENDENT_AMBULATORY_CARE_PROVIDER_SITE_OTHER): Payer: Medicaid Other | Admitting: Obstetrics and Gynecology

## 2021-06-18 ENCOUNTER — Other Ambulatory Visit (HOSPITAL_COMMUNITY)
Admission: RE | Admit: 2021-06-18 | Discharge: 2021-06-18 | Disposition: A | Discharge status: Home / Self Care | Payer: Medicaid Other | Source: Ambulatory Visit | Attending: Obstetrics and Gynecology | Admitting: Obstetrics and Gynecology

## 2021-06-18 VITALS — BP 128/60 | Ht 67.0 in | Wt 183.0 lb

## 2021-06-18 DIAGNOSIS — Z113 Encounter for screening for infections with a predominantly sexual mode of transmission: Secondary | ICD-10-CM

## 2021-06-18 DIAGNOSIS — R300 Dysuria: Secondary | ICD-10-CM | POA: Diagnosis not present

## 2021-06-18 DIAGNOSIS — B3731 Acute candidiasis of vulva and vagina: Secondary | ICD-10-CM | POA: Diagnosis not present

## 2021-06-18 LAB — POCT URINALYSIS DIPSTICK
Bilirubin, UA: NEGATIVE
Blood, UA: NEGATIVE
Glucose, UA: NEGATIVE
Ketones, UA: NEGATIVE
Nitrite, UA: NEGATIVE
Protein, UA: POSITIVE — AB
Spec Grav, UA: 1.02 (ref 1.010–1.025)
pH, UA: 6 (ref 5.0–8.0)

## 2021-06-18 LAB — POCT WET PREP WITH KOH
Clue Cells Wet Prep HPF POC: NEGATIVE
KOH Prep POC: NEGATIVE
Trichomonas, UA: NEGATIVE
Yeast Wet Prep HPF POC: POSITIVE

## 2021-06-18 MED ORDER — FLUCONAZOLE 150 MG PO TABS: 150.0000 mg | ORAL_TABLET | Freq: Once | ORAL | 0 refills | Status: AC

## 2021-06-18 NOTE — Patient Instructions (Signed)
I value your feedback and you entrusting us with your care. If you get a [facility] patient survey, I would appreciate you taking the time to let us know about your experience today. Thank you! ? ? ?

## 2021-06-18 NOTE — Progress Notes (Signed)
? ? ?Patient, No Pcp Per (Inactive) ? ? ?Chief Complaint  ?Patient presents with  ? Urinary Tract Infection  ?  Discomfort after urinating, pelvic pain x 1 month, no frequency  ? STD testing  ?  Vag itching, discharge x 4 weeks  ? ? ?HPI: ?     Hailey Martin is a 26 y.o. [phone] whose LMP was Patient's last menstrual period was 05/19/2021 (approximate)., presents today for UTI sx of tingling/dysuria at clitoris after urination and occas during the day; sx for about a month. No other UTI sx of frequency, urgency, hematuria, LBP.  Has noted a little pelvic discomfort. Drinking sodas and more water now. Never been diagnosed with UTI in past. Has also had increased vag d/c, sometimes with irritation, no fishy odor ofr past few wks. Hx of BV and yeast in past, no meds to treat this time. Uses pH soap that works well for her. Neg gon/chlam/trich at health dept 05/28/21. Has had a new sexual partner since last tested, so would like STD testing again today. No pain/bleeding. ?Due for pap smear/annual (noted after pt left).  ? ?Patient Active Problem List  ? Diagnosis Date Noted  ? Candidal vaginitis 06/18/2021  ? Morbid obesity (HCC) 180 lbs 12/25/2020  ? Shortness of breath   ? Non-intractable vomiting   ? Symptomatic anemia 02/22/2020  ? Menorrhagia with irregular cycle   ? Overweight 181 lbs 12/13/2019  ? History of bilateral tubal ligation 2020 04/29/2019  ? Anemia affecting pregnancy in third trimester 11/17/2016  ? Previous cesarean section 09/12/2016  ? Dysmenorrhea 09/07/2014  ? Allergic rhinitis, seasonal 09/07/2014  ? 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 ? ?Past Surgical History:  ?Procedure Laterality Date  ? CESAREAN SECTION N/A 08/29/2015  ? Procedure: CESAREAN SECTION;  Surgeon: Vena AustriaAndreas Staebler, MD;  Location: [facility];  Service: Obstetrics;  Laterality: N/A;  ? CESAREAN SECTION N/A 01/28/2017  ? Procedure: CESAREAN SECTION;  Surgeon: Conard NovakJackson, Stephen D, MD;  Location: [facility];  Service: Obstetrics;  Laterality:  N/A;  ? CESAREAN SECTION WITH BILATERAL TUBAL LIGATION N/A 08/11/2018  ? Procedure: CESAREAN SECTION WITH TUBAL LIGATION;  Surgeon: Vena AustriaStaebler, Andreas, MD;  Location: [facility];  Service: Obstetrics;  Laterality: N/A;  ? WISDOM TOOTH EXTRACTION    ? ? ?Family History  ?Problem Relation Age of Onset  ? Heart failure Father [id number]  ? Alcohol abuse Father   ? Kidney disease Father   ? Liver disease Father   ? Thyroid disease Mother   ? Breast cancer Paternal Grandmother   ? Diabetes Paternal Grandmother   ? Hypertension Paternal Grandmother   ? Colon cancer Paternal Grandfather   ? Hypertension Paternal Grandfather   ? Heart disease Paternal Grandfather   ? ? ?Social History  ? ?Socioeconomic History  ? Marital status: Significant Other  ?  Spouse name: Caesar Chestnutkoury  ? Number of children: 2  ? Years of education: 6412  ? Highest education level: Not on file  ?Occupational History  ? Occupation: Deli   ?  Employer: [employer]  ?Tobacco Use  ? Smoking status: Never  ? Smokeless tobacco: Never  ?Vaping Use  ? Vaping Use: Never used  ?Substance and Sexual Activity  ? Alcohol use: Yes  ?  Alcohol/week: 2.0 standard drinks  ?  Types: 1 Glasses of wine, 1 Standard drinks or equivalent per week  ?  Comment: occassionally  ? Drug use: Not Currently  ?  Types: Marijuana  ?  Comment: last use  4 years ago, patient reprots trying marijuana denies continuous use.  ? Sexual activity: Yes  ?  Partners: Male  ?  Birth control/protection: Surgical  ?  Comment: Tubal ligation  ?Other Topics Concern  ? Not on file  ?Social History Narrative  ? Not on file  ? ?Social Determinants of Health  ? ?Financial Resource Strain: Not on file  ?Food Insecurity: Not on file  ?Transportation Needs: Not on file  ?Physical Activity: Not on file  ?Stress: Not on file  ?Social Connections: Not on file  ?Intimate Partner Violence: Not At Risk  ? Fear of Current or Ex-Partner: No  ? Emotionally Abused: No  ? Physically Abused: No  ? Sexually Abused: No   ? ? ?Outpatient Medications Prior to Visit  ?Medication Sig Dispense Refill  ? ferrous sulfate 325 (65 FE) MG tablet Take 1 tablet (325 mg total) by mouth daily. 30 tablet 0  ? benzonatate (TESSALON) 100 MG capsule Take 2 capsules (200 mg total) by mouth every 8 (eight) hours. (Patient not taking: Reported on 05/28/2021) 21 capsule 0  ? clotrimazole (CLOTRIMAZOLE-7) 1 % vaginal cream Place 1 Applicatorful vaginally at bedtime. (Patient not taking: Reported on 05/28/2021) 45 g 0  ? ipratropium (ATROVENT) 0.06 % nasal spray Place 2 sprays into both nostrils 4 (four) times daily. (Patient not taking: Reported on 05/28/2021) 15 mL 12  ? ?No facility-administered medications prior to visit.  ? ? ? ? ?ROS: ? ?Review of Systems  ?Constitutional:  Negative for fever.  ?Gastrointestinal:  Negative for blood in stool, constipation, diarrhea, nausea and vomiting.  ?Genitourinary:  Positive for dysuria and vaginal discharge. Negative for dyspareunia, flank pain, frequency, hematuria, urgency, vaginal bleeding and vaginal pain.  ?Musculoskeletal:  Negative for back pain.  ?Skin:  Negative for rash.  ?BREAST: No symptoms ? ? ?OBJECTIVE:  ? ?Vitals:  ?BP 128/60   Ht 5\' 7"  (1.702 m)   Wt 183 lb (83 kg)   LMP 05/19/2021 (Approximate)   BMI 28.66 kg/m?  ? ?Physical Exam ?Vitals reviewed.  ?Constitutional:   ?   Appearance: She is well-developed.  ?Pulmonary:  ?   Effort: Pulmonary effort is normal.  ?Genitourinary: ?   General: Normal vulva.  ?   Pubic Area: No rash.   ?   Labia:     ?   Right: Rash present. No tenderness or lesion.     ?   Left: Rash present. No tenderness or lesion.   ?   Vagina: Normal. No vaginal discharge, erythema or tenderness.  ?   Cervix: Normal.  ?   Uterus: Normal. Not enlarged and not tender.   ?   Adnexa: Right adnexa normal and left adnexa normal.    ?   Right: No mass or tenderness.      ?   Left: No mass or tenderness.    ?   Comments: BILAT LABIA MINORA WITH MILD ERYTHEMA; CLITORIS SLIGHTLY  IRRITATED ?Musculoskeletal:     ?   General: Normal range of motion.  ?   Cervical back: Normal range of motion.  ?Skin: ?   General: Skin is warm and dry.  ?Neurological:  ?   General: No focal deficit present.  ?   Mental Status: She is alert and oriented to person, place, and time.  ?Psychiatric:     ?   Mood and Affect: Mood normal.     ?   Behavior: Behavior normal.     ?   Thought  Content: Thought content normal.     ?   Judgment: Judgment normal.  ? ? ?Results: ?Results for orders placed or performed in visit on 06/18/21 (from the past 24 hour(s))  ?POCT Wet Prep with KOH     Status: Abnormal  ? Collection Time: 06/18/21  4:27 PM  ?Result Value Ref Range  ? Trichomonas, UA Negative   ? Clue Cells Wet Prep HPF POC neg   ? Epithelial Wet Prep HPF POC    ? Yeast Wet Prep HPF POC pos   ? Bacteria Wet Prep HPF POC    ? RBC Wet Prep HPF POC    ? WBC Wet Prep HPF POC    ? KOH Prep POC Negative Negative  ?POCT Urinalysis Dipstick     Status: Abnormal  ? Collection Time: 06/18/21  4:28 PM  ?Result Value Ref Range  ? Color, UA amber   ? Clarity, UA clear   ? Glucose, UA Negative Negative  ? Bilirubin, UA neg   ? Ketones, UA neg   ? Spec Grav, UA 1.020 1.010 - 1.025  ? Blood, UA neg   ? pH, UA 6.0 5.0 - 8.0  ? Protein, UA Positive (A) Negative  ? Urobilinogen, UA    ? Nitrite, UA neg   ? Leukocytes, UA Moderate (2+) (A) Negative  ? Appearance    ? Odor    ? ? ? ?Assessment/Plan: ?Candidal vaginitis - Plan: POCT Wet Prep with KOH, fluconazole (DIFLUCAN) 150 MG tablet; pos sx and wet prep. Rx diflucan. F/u prn.  ? ?Dysuria - Plan: POCT Urinalysis Dipstick, Urine Culture; questionable UA. Check C&S. If neg, sx most likely yeast vag.  ? ?Screening for STD (sexually transmitted disease) - Plan: Cervicovaginal ancillary only ? ? ?Meds ordered this encounter  ?Medications  ? fluconazole (DIFLUCAN) 150 MG tablet  ?  Sig: Take 1 tablet (150 mg total) by mouth once for 1 dose. May repeat in 3 days if still having symptoms  ?   Dispense:  2 tablet  ?  Refill:  0  ?  Order Specific Question:   Supervising Provider  ?  AnswerNadara Mustard [[id number]]  ? ? ? Return in about 2 months (around 08/18/2021), or if symptoms worsen or fail to improve, for annual.

## 2021-06-20 LAB — CERVICOVAGINAL ANCILLARY ONLY
Chlamydia: NEGATIVE
Comment: NEGATIVE
Comment: NEGATIVE
Comment: NORMAL
Neisseria Gonorrhea: NEGATIVE
Trichomonas: NEGATIVE

## 2021-06-20 LAB — URINE CULTURE

## 2021-06-23 DIAGNOSIS — Z419 Encounter for procedure for purposes other than remedying health state, unspecified: Secondary | ICD-10-CM | POA: Diagnosis not present

## 2021-06-25 ENCOUNTER — Other Ambulatory Visit: Payer: Self-pay | Admitting: Obstetrics and Gynecology

## 2021-06-25 ENCOUNTER — Telehealth: Payer: Self-pay

## 2021-06-25 MED ORDER — CLOTRIMAZOLE-BETAMETHASONE 1-0.05 % EX CREA: TOPICAL_CREAM | CUTANEOUS | 0 refills | Status: DC

## 2021-06-25 NOTE — Progress Notes (Signed)
Rx lotrisone crm for ext irritation ?

## 2021-06-25 NOTE — Telephone Encounter (Signed)
Try Rx lotrisone crm ext BID prn sx. Rx eRxd. F/u prn.Could be fungal irritation ext that diflucan won't treat.

## 2021-06-25 NOTE — Telephone Encounter (Signed)
Pt calling; needs help understanding her results.  [phone]  Explained results.  Pt states she has taken both pills for yeast rx'd and is still feeling tingling.  Adv ABC is out of the office and is good at checking her msgs; msg will be sent to her; pharmacy is correct in chart. ?

## 2021-06-26 NOTE — Telephone Encounter (Signed)
Pt aware.

## 2021-07-23 DIAGNOSIS — Z419 Encounter for procedure for purposes other than remedying health state, unspecified: Secondary | ICD-10-CM | POA: Diagnosis not present

## 2021-08-23 DIAGNOSIS — Z419 Encounter for procedure for purposes other than remedying health state, unspecified: Secondary | ICD-10-CM | POA: Diagnosis not present

## 2021-09-22 DIAGNOSIS — Z419 Encounter for procedure for purposes other than remedying health state, unspecified: Secondary | ICD-10-CM | POA: Diagnosis not present

## 2021-10-10 ENCOUNTER — Ambulatory Visit: Payer: Medicaid Other

## 2021-10-17 ENCOUNTER — Ambulatory Visit: Payer: Medicaid Other | Admitting: Advanced Practice Midwife

## 2021-10-23 DIAGNOSIS — Z419 Encounter for procedure for purposes other than remedying health state, unspecified: Secondary | ICD-10-CM | POA: Diagnosis not present

## 2021-11-23 DIAGNOSIS — Z419 Encounter for procedure for purposes other than remedying health state, unspecified: Secondary | ICD-10-CM | POA: Diagnosis not present

## 2021-12-13 ENCOUNTER — Ambulatory Visit: Payer: Medicaid Other

## 2021-12-18 ENCOUNTER — Ambulatory Visit: Payer: Medicaid Other

## 2021-12-23 DIAGNOSIS — Z419 Encounter for procedure for purposes other than remedying health state, unspecified: Secondary | ICD-10-CM | POA: Diagnosis not present

## 2022-01-17 ENCOUNTER — Ambulatory Visit: Payer: Medicaid Other | Admitting: Advanced Practice Midwife

## 2022-01-17 ENCOUNTER — Encounter: Payer: Self-pay | Admitting: Advanced Practice Midwife

## 2022-01-17 DIAGNOSIS — Z113 Encounter for screening for infections with a predominantly sexual mode of transmission: Secondary | ICD-10-CM

## 2022-01-17 DIAGNOSIS — Z720 Tobacco use: Secondary | ICD-10-CM | POA: Insufficient documentation

## 2022-01-17 DIAGNOSIS — A599 Trichomoniasis, unspecified: Secondary | ICD-10-CM | POA: Insufficient documentation

## 2022-01-17 DIAGNOSIS — Z9851 Tubal ligation status: Secondary | ICD-10-CM | POA: Insufficient documentation

## 2022-01-17 LAB — WET PREP FOR TRICH, YEAST, CLUE
Trichomonas Exam: POSITIVE — AB
Yeast Exam: NEGATIVE

## 2022-01-17 LAB — HM HIV SCREENING LAB: HM HIV Screening: NEGATIVE

## 2022-01-17 MED ORDER — METRONIDAZOLE 500 MG PO TABS: 500.0000 mg | ORAL_TABLET | Freq: Two times a day (BID) | ORAL | 0 refills | Status: AC

## 2022-01-17 NOTE — Progress Notes (Signed)
[facility] Department  STI clinic/screening visit [place] [address] [phone]  Subjective:  Hailey Martin is a 26 y.o. SBF G3P3 vaper female being seen today for an STI screening visit. The patient reports they do have symptoms.  Patient reports that they do not desire a pregnancy in the next year.   They reported they are not interested in discussing contraception today.    Patient's last menstrual period was 12/18/2021 (approximate).   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 Last sex 01/12/22 with condom; with current partner x 2 mo; 1 partner in last 3 mo. LMP 12/18/21. BTL 2020. Last MJ 8 years ago. Last ETOH 01/11/22 (2 glasses wine+3 shots liquor) 2x/mo. Current vaper.   Does the patient using douching products? No  Last HIV test per patient/review of record was  Lab Results  Component Value Date   HMHIVSCREEN Negative - Validated 03/24/2018    Lab Results  Component Value Date   HIV Non Reactive 02/22/2020   Patient reports last pap was No results found for: "DIAGPAP"   Screening for MPX risk: Does the patient have an unexplained rash? No Is the patient MSM? No Does the  patient endorse multiple sex partners or anonymous sex partners? No Did the patient have close or sexual contact with a person diagnosed with MPX? No Has the patient traveled outside the [place] where MPX is endemic? No Is there a high clinical suspicion for MPX-- evidenced by one of the following No  -Unlikely to be chickenpox  -Lymphadenopathy  -Rash that present in same phase of evolution on any given body part See flowsheet for further details and programmatic requirements.   Immunization history:  Immunization History  Administered Date(s) Administered   DTaP 11/29/1994, 01/30/1995, 04/01/1995, 03/12/1996, 08/15/1999   HIB (PRP-OMP) 11/29/1994, 01/30/1995, 04/01/1995, 03/12/1996   HIB (PRP-T) 11/29/1994, 01/30/1995, 04/01/1995, 03/12/1996   HPV Quadrivalent 03/12/2011, 03/12/2011, 05/21/2011, 05/21/2011, 11/01/2011, 11/01/2011   Hepatitis A 11/08/2009, 03/12/2011   Hepatitis A, Adult 11/08/2009   Hepatitis A, Ped/Adol-2 Dose 03/12/2011   Hepatitis B 12-19-94, 11/29/1994, 04/01/1995   Hepatitis B, PED/ADOLESCENT 08/22/94, 11/29/1994, 04/01/1995   Hepatitis B, adult 1995-12-07   IPV 11/29/1994, 01/30/1995, 03/12/1996, 08/15/1999   MMR 03/12/1996, 08/15/1999   Meningococcal Conjugate 01/26/2008   PPD Test 04/02/2019   Td 10/21/2006   Tdap 10/21/2006, 11/14/2016, 06/22/2018     The following portions of the patient's history were reviewed and updated as appropriate: allergies, current medications, past medical history, past social history, past surgical history and problem list.  Objective:  There were no vitals filed for this visit.  Physical Exam Vitals and nursing note reviewed.  Constitutional:      Appearance:  Normal appearance. She is normal weight.  HENT:     Head: Normocephalic and atraumatic.     Mouth/Throat:     Mouth: Mucous membranes are moist.     Pharynx: Oropharynx is clear. No oropharyngeal exudate or posterior oropharyngeal erythema.  Eyes:      Conjunctiva/sclera: Conjunctivae normal.  Pulmonary:     Effort: Pulmonary effort is normal.  Abdominal:     General: Abdomen is flat.     Palpations: Abdomen is soft. There is no mass.     Tenderness: There is no abdominal tenderness. There is no rebound.     Comments: Soft without masses or tenderness, fair tone  Genitourinary:    General: Normal vulva.     Exam position: Lithotomy position.     Pubic Area: No rash or pubic lice.      Labia:        Right: No rash or lesion.        Left: No rash or lesion.      Vagina: Vaginal discharge (white creamy leukorrhea,ph<4.5) present. No erythema, bleeding or lesions.     Cervix: Normal.     Uterus: Normal.      Adnexa: Right adnexa normal and left adnexa normal.     Rectum: Normal.     Comments: pH = <4.5 Lymphadenopathy:     Head:     Right side of head: No preauricular or posterior auricular adenopathy.     Left side of head: No preauricular or posterior auricular adenopathy.     Cervical: No cervical adenopathy.     Right cervical: No superficial, deep or posterior cervical adenopathy.    Left cervical: No superficial, deep or posterior cervical adenopathy.     Upper Body:     Right upper body: No supraclavicular, axillary or epitrochlear adenopathy.     Left upper body: No supraclavicular, axillary or epitrochlear adenopathy.     Lower Body: No right inguinal adenopathy. No left inguinal adenopathy.  Skin:    General: Skin is warm and dry.     Findings: No rash.  Neurological:     Mental Status: She is alert and oriented to person, place, and time.     Assessment and Plan:  Hailey Martin is a 26 y.o. female presenting to the [place] for STI screening  1. Screening examination for venereal disease Treat wet mount per standing orders Immunization nurse consult  - WET PREP FOR [place], YEAST, [place] LAB - Syphilis Serology, [place] [facility] - Gonococcus  culture     No follow-ups on file.  No future appointments.  Herbie Saxon, CNM

## 2022-01-17 NOTE — Progress Notes (Signed)
Pt seen for STI screening. Seen by Mable Fill. Initial results reviewed with pt and Metronidazole dispensed per standing order to treat Trich infection. Pt agreed to take as prescribed. Contact cards given.

## 2022-01-23 DIAGNOSIS — Z419 Encounter for procedure for purposes other than remedying health state, unspecified: Secondary | ICD-10-CM | POA: Diagnosis not present

## 2022-01-25 LAB — GONOCOCCUS CULTURE

## 2022-02-10 IMAGING — US US PELVIS COMPLETE WITH TRANSVAGINAL
1 series · 13 of 25 positions shown · non-contrast
Comparison: Obstetrical ultrasound 06/22/2018

CLINICAL DATA: Menorrhagia with irregular cycles

EXAM:
TRANSABDOMINAL AND TRANSVAGINAL ULTRASOUND OF PELVIS
TECHNIQUE: Both transabdominal and transvaginal ultrasound examinations of the
pelvis were performed. Transabdominal technique was performed for
global imaging of the pelvis including uterus, ovaries, adnexal
regions, and pelvic cul-de-sac. It was necessary to proceed with
endovaginal exam following the transabdominal exam to visualize the
bilateral ovaries.

[Series 1: us pelvic complete with transvaginal · 13 of 75 slices shown]
[im 1/75]
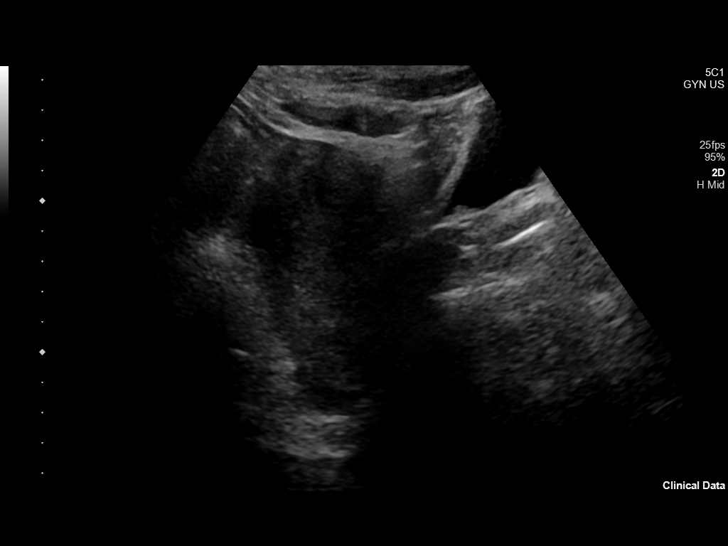
[im 7/75]
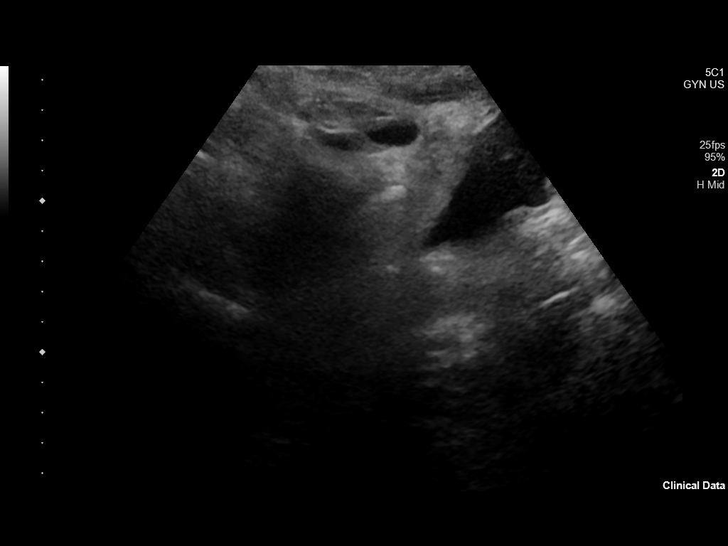
[im 13/75]
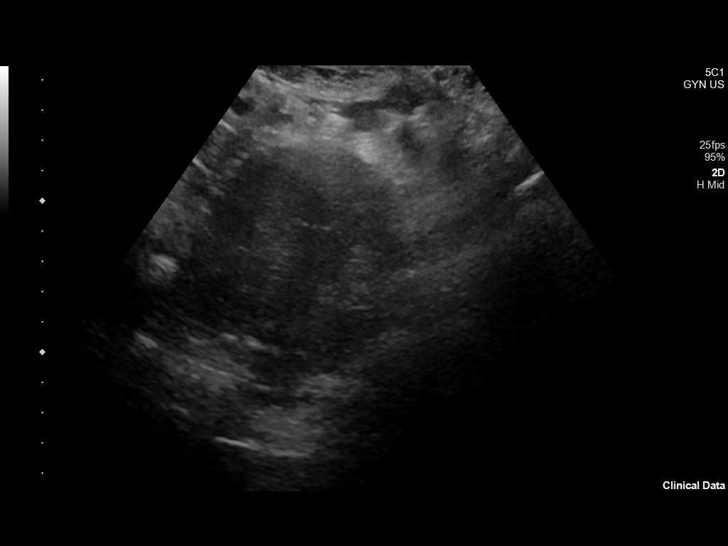
[im 19/75]
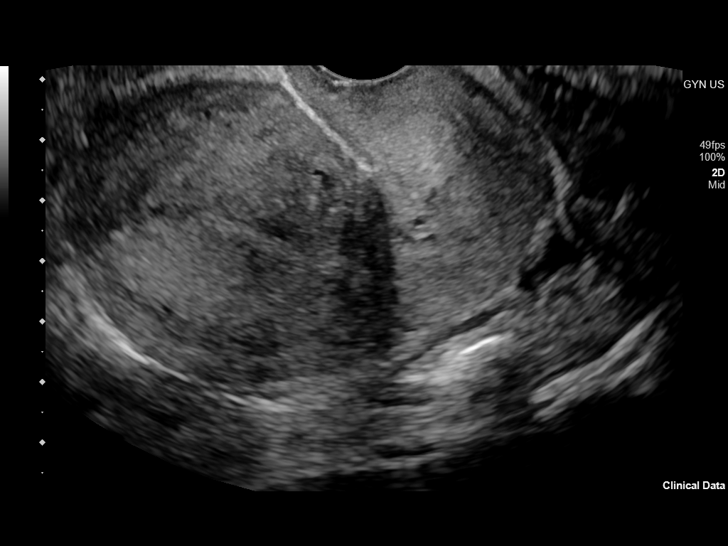
[im 25/75]
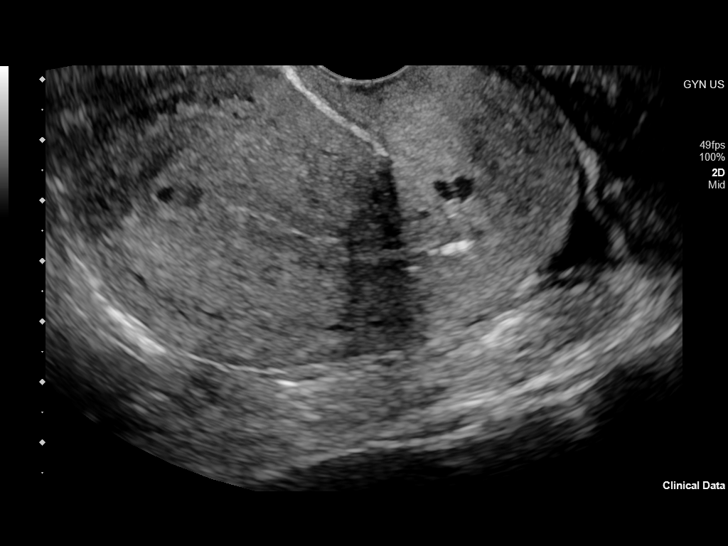
[im 31/75]
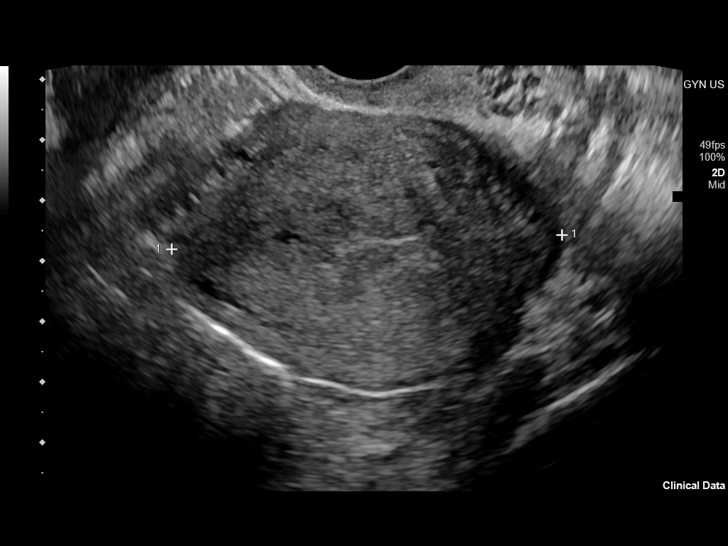
[im 38/75]
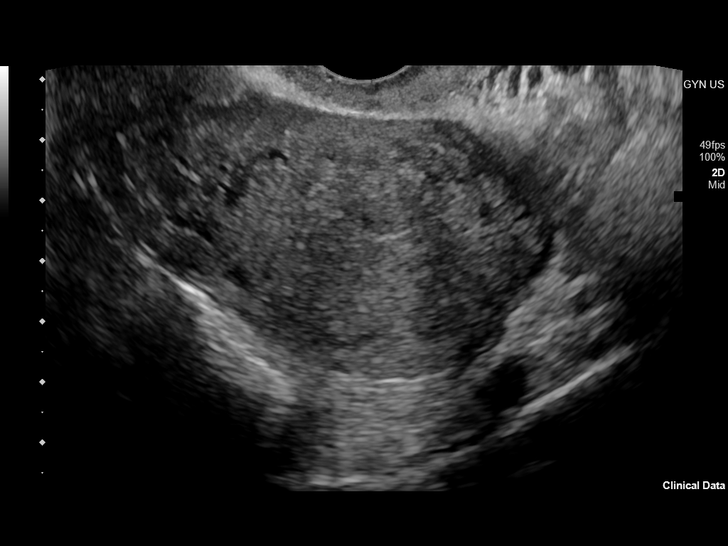
[im 44/75]
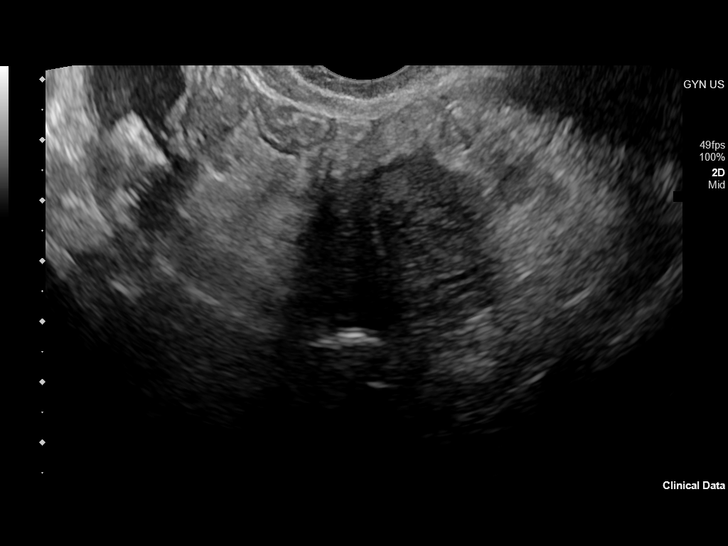
[im 50/75]
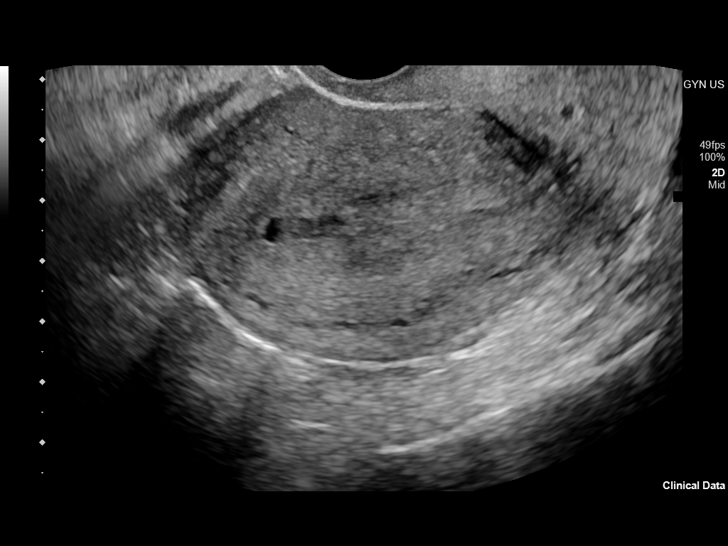
[im 56/75]
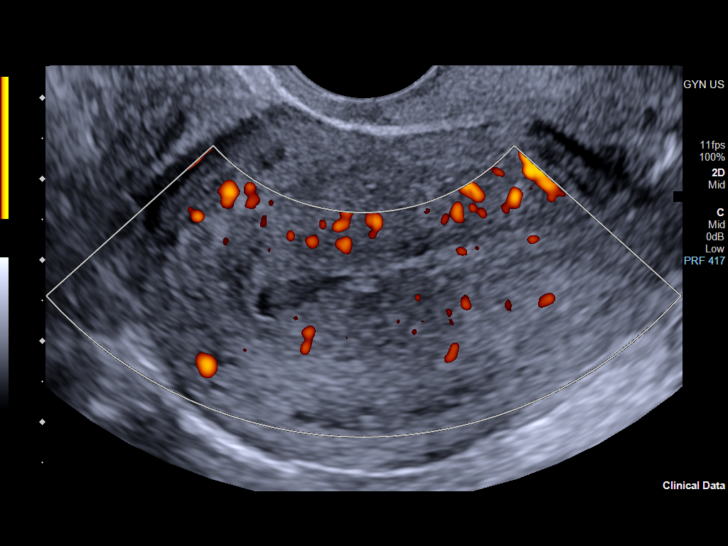
[im 62/75]
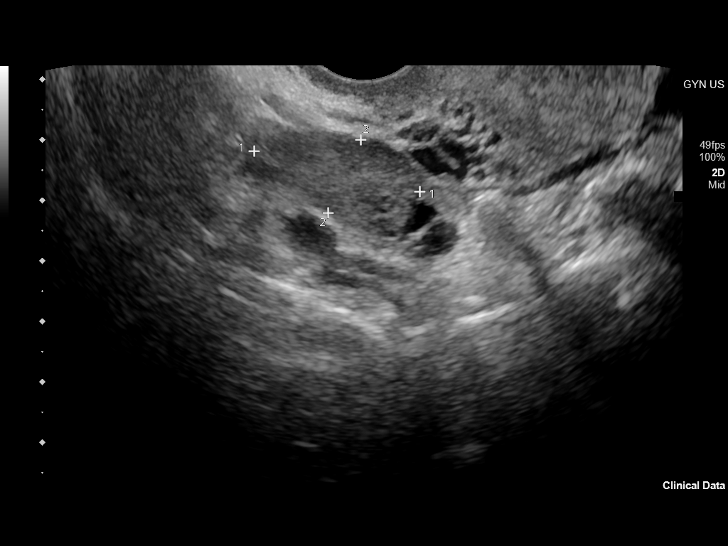
[im 68/75]
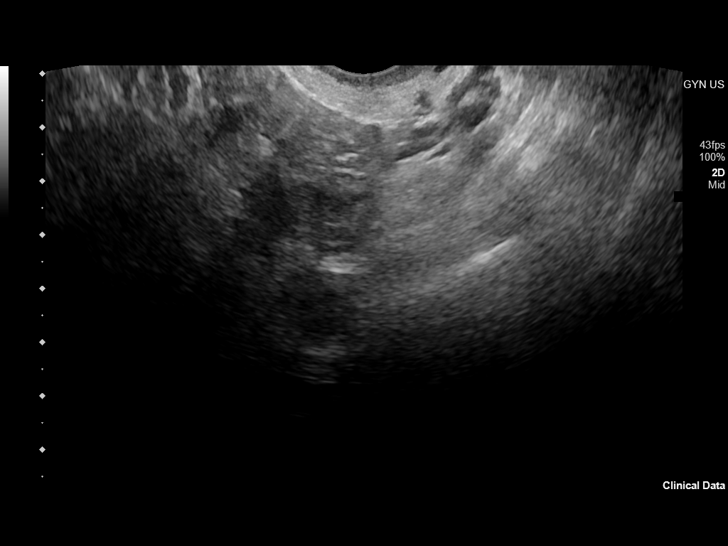
[im 75/75]
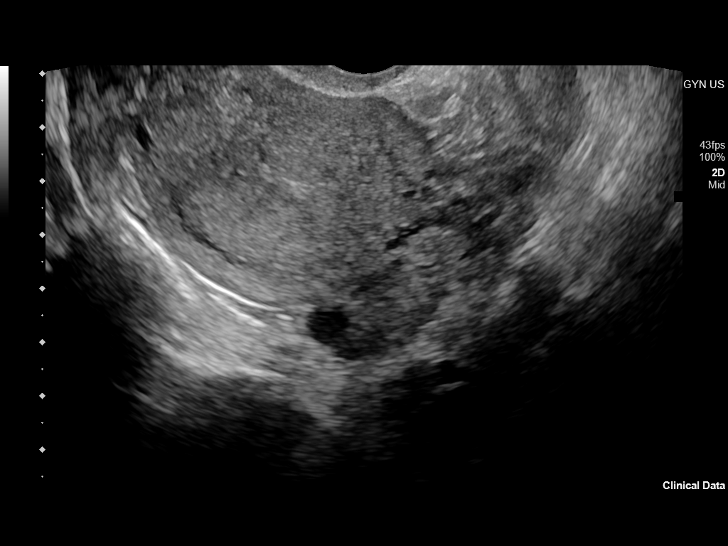

[13 of 25 positions shown; findings below may reference images not displayed]

FINDINGS: Uterus

Measurements: 7.8 x 5.1 x 6.5 cm = volume: 135.3 mL. Anteverted and
slightly anteflexed uterus.

Endometrium

Thickness: 2.3 mm. Slightly heterogeneous with echogenic endometrial
debris and trace fluid. Additionally, there is subendometrial cystic
change at the uterine fundus (image 27/76).

Right ovary

Measurements: 2.8 x 1.3 x 1.2 cm = volume: 2.4 mL. Normal appearance
of the ovary. No concerning adnexal lesion.

Left ovary

Measurements: 3.5 x 1.4 x 1.6 cm = volume: 4 mL. Normal appearance
of the ovary. No concerning adnexal lesion.

Other findings

Trace to small anechoic volume free fluid predominantly within the
right adnexa.
IMPRESSION: 1. Slightly heterogeneous endometrium with echogenic debris and
trace fluid. Findings could reflect endometrial blood products.
Subendometrial cystic change at the uterine fundus is nonspecific
though can be seen in the setting of adenomyosis. If bleeding
remains unresponsive to hormonal or medical therapy, sonohysterogram
should be considered for focal lesion work-up. (Ref: Radiological
Reasoning: Algorithmic Workup of Abnormal Vaginal Bleeding with
Endovaginal Sonography and Sonohysterography. AJR [id number]; 191:S68-73)
2. Trace to small volume of anechoic free fluid in the right adnexa,
nonspecific and often physiologic in a reproductive age female.

## 2022-02-22 DIAGNOSIS — Z419 Encounter for procedure for purposes other than remedying health state, unspecified: Secondary | ICD-10-CM | POA: Diagnosis not present

## 2022-03-25 DIAGNOSIS — Z419 Encounter for procedure for purposes other than remedying health state, unspecified: Secondary | ICD-10-CM | POA: Diagnosis not present

## 2022-04-25 DIAGNOSIS — Z419 Encounter for procedure for purposes other than remedying health state, unspecified: Secondary | ICD-10-CM | POA: Diagnosis not present

## 2022-05-24 DIAGNOSIS — Z419 Encounter for procedure for purposes other than remedying health state, unspecified: Secondary | ICD-10-CM | POA: Diagnosis not present

## 2022-06-24 DIAGNOSIS — Z419 Encounter for procedure for purposes other than remedying health state, unspecified: Secondary | ICD-10-CM | POA: Diagnosis not present

## 2022-07-24 DIAGNOSIS — Z419 Encounter for procedure for purposes other than remedying health state, unspecified: Secondary | ICD-10-CM | POA: Diagnosis not present

## 2022-08-24 DIAGNOSIS — Z419 Encounter for procedure for purposes other than remedying health state, unspecified: Secondary | ICD-10-CM | POA: Diagnosis not present

## 2022-08-28 ENCOUNTER — Ambulatory Visit
Admission: EM | Admit: 2022-08-28 | Discharge: 2022-08-28 | Disposition: A | Discharge status: Home / Self Care | Payer: Medicaid Other | Attending: Family Medicine | Admitting: Family Medicine

## 2022-08-28 VITALS — BP 130/78 | HR 86 | Temp 99.3°F

## 2022-08-28 DIAGNOSIS — B9689 Other specified bacterial agents as the cause of diseases classified elsewhere: Secondary | ICD-10-CM | POA: Diagnosis not present

## 2022-08-28 DIAGNOSIS — N760 Acute vaginitis: Secondary | ICD-10-CM

## 2022-08-28 LAB — WET PREP, GENITAL
Sperm: NONE SEEN
Trich, Wet Prep: NONE SEEN
WBC, Wet Prep HPF POC: <10 — AB (ref <10)
Yeast Wet Prep HPF POC: NONE SEEN

## 2022-08-28 LAB — URINALYSIS, W/ REFLEX TO CULTURE (INFECTION SUSPECTED)
Bilirubin Urine: NEGATIVE
Glucose, UA: NEGATIVE mg/dL
Hgb urine dipstick: NEGATIVE
Ketones, ur: NEGATIVE mg/dL
Nitrite: NEGATIVE
Protein, ur: 100 mg/dL — AB
Specific Gravity, Urine: >1.03 — ABNORMAL HIGH (ref 1.005–1.030)
pH: 5.5 (ref 5.0–8.0)

## 2022-08-28 MED ORDER — METRONIDAZOLE 500 MG PO TABS: 500.0000 mg | ORAL_TABLET | Freq: Two times a day (BID) | ORAL | 0 refills | Status: DC

## 2022-08-28 NOTE — ED Triage Notes (Signed)
Pt presents to UC for vaginal irritation, discharge, denies any urinary sxs.

## 2022-08-28 NOTE — ED Provider Notes (Signed)
[facility]    CSN: [id number] Arrival date & time: 08/28/22  1457      History   Chief Complaint Chief Complaint  Patient presents with   Vaginal Discharge     HPI HPI BINTOU Hailey Martin is a 27 y.o. female.    Hailey Martin presents for vaginal irritation, odor and discharge .  Tried a new soap prior to arrival. Denies known STI exposure.  Hailey Martin does not use condoms regularly. She is  not currently pregnant.  Patient's last menstrual period was 08/19/2022 (approximate).     Past Medical History:  Diagnosis Date   Allergy    Anemia    Delivery of pregnancy by cesarean section 08/11/2018   Dysmenorrhea    History of cervicitis 11/26/2012   Chlamydia     Overweight    Seborrhea     Patient Active Problem List   Diagnosis Date Noted   Vapes nicotine containing substance 01/17/2022   History of tubal ligation 2020 01/17/2022   Trichomonas infection 01/17/22 01/17/2022   Candidal vaginitis 06/18/2021   Morbid obesity (HCC) 180 lbs 12/25/2020   Shortness of breath    Non-intractable vomiting    Symptomatic anemia 02/22/2020   Menorrhagia with irregular cycle    Overweight 181 lbs 12/13/2019   History of bilateral tubal ligation 2020 04/29/2019   Anemia affecting pregnancy in third trimester 11/17/2016   Previous cesarean section 09/12/2016   Dysmenorrhea 09/07/2014   Allergic rhinitis, seasonal 09/07/2014   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/07/2014    Past Surgical History:  Procedure Laterality Date   CESAREAN SECTION N/A 08/29/2015   Procedure: CESAREAN SECTION;  Surgeon: Vena Austria, MD;  Location: [facility];  Service: Obstetrics;  Laterality: N/A;   CESAREAN SECTION N/A 01/28/2017   Procedure: CESAREAN SECTION;  Surgeon: Conard Novak, MD;  Location: [facility];  Service: Obstetrics;  Laterality: N/A;   CESAREAN SECTION WITH BILATERAL TUBAL LIGATION N/A 08/11/2018   Procedure: CESAREAN SECTION WITH TUBAL LIGATION;  Surgeon: Vena Austria, MD;  Location:  [facility];  Service: Obstetrics;  Laterality: N/A;   WISDOM TOOTH EXTRACTION      OB History     Gravida  3   Para  3   Term  3   Preterm  0   AB  0   Living  3      SAB  0   IAB  0   Ectopic  0   Multiple  0   Live Births  3        Obstetric Comments  LTCS for placental abruption          Home Medications    Prior to Admission medications   Medication Sig Start Date End Date Taking? Authorizing Provider  metroNIDAZOLE (FLAGYL) 500 MG tablet Take 1 tablet (500 mg total) by mouth 2 (two) times daily. 08/28/22  Yes Riana Tessmer, Seward Meth, DO  clotrimazole-betamethasone (LOTRISONE) cream Apply externally BID prn sx up to 2 wks 06/25/21   Copland, Helmut Muster B, PA-C  ferrous sulfate 325 (65 FE) MG tablet Take 1 tablet (325 mg total) by mouth daily. 02/23/20 01/28/21  Delfino Lovett, MD    Family History Family History  Problem Relation Age of Onset   Heart failure Father 49   Alcohol abuse Father    Kidney disease Father    Liver disease Father    Thyroid disease Mother    Breast cancer Paternal Grandmother    Diabetes Paternal Grandmother    Hypertension Paternal  Grandmother    Colon cancer Paternal Grandfather    Hypertension Paternal Grandfather    Heart disease Paternal Grandfather     Social History Social History   Tobacco Use   Smoking status: Some Days    Types: E-cigarettes   Smokeless tobacco: Never  Vaping Use   Vaping Use: Never used  Substance Use Topics   Alcohol use: Yes    Alcohol/week: 5.0 standard drinks of alcohol    Types: 2 Glasses of wine, 3 Shots of liquor per week    Comment: last use 01/11/22 2x/mo   Drug use: Not Currently    Types: Marijuana    Comment: last use 4 years ago, patient reprots trying marijuana denies continuous use.     Allergies   Patient has no known allergies.   Review of Systems Review of Systems: :negative unless otherwise stated in HPI.      Physical Exam Triage Vital Signs ED Triage Vitals [08/28/22  1533]  Enc Vitals Group     BP 130/78     Pulse Rate 86     Resp      Temp 99.3 F (37.4 C)     Temp Source Oral     SpO2 99 %     Weight      Height      Head Circumference      Peak Flow      Pain Score      Pain Loc      Pain Edu?      Excl. in GC?    No data found.  Updated Vital Signs BP 130/78 (BP Location: Right Arm)   Pulse 86   Temp 99.3 F (37.4 C) (Oral)   LMP 08/19/2022 (Approximate)   SpO2 99%   Visual Acuity Right Eye Distance:   Left Eye Distance:   Bilateral Distance:    Right Eye Near:   Left Eye Near:    Bilateral Near:     Physical Exam GEN: well appearing female in no acute distress  CVS: well perfused  RESP: speaking in full sentences without pause  GU: deferred, patient performed self swab     UC Treatments / Results  Labs (all labs ordered are listed, but only abnormal results are displayed) Labs Reviewed  WET PREP, GENITAL - Abnormal; Notable for the following components:      Result Value   Clue Cells Wet Prep HPF POC PRESENT (*)    WBC, Wet Prep HPF POC <10 (*)    All other components within normal limits  URINALYSIS, W/ REFLEX TO CULTURE (INFECTION SUSPECTED) - Abnormal; Notable for the following components:   Specific Gravity, Urine >1.030 (*)    Protein, ur 100 (*)    Leukocytes,Ua TRACE (*)    Bacteria, UA FEW (*)    All other components within normal limits  URINE CULTURE  CERVICOVAGINAL ANCILLARY ONLY    EKG   Radiology No results found.  Procedures Procedures (including critical care time)  Medications Ordered in UC Medications - No data to display  Initial Impression / Assessment and Plan / UC Course  I have reviewed the triage vital signs and the nursing notes.  Pertinent labs & imaging results that were available during my care of the patient were reviewed by me and considered in my medical decision making (see chart for details).      Patient is a 27 y.o.Hailey Martin female  who presents for vaginal concerns   Overall patient is well-appearing  and afebrile.  Vital signs stable.  UA consistent with possible acute cystitis however pt not having dysuria but does endorse vaginal irritation.   Hematuria supported on microscopy. Urine culture obtained.  Follow-up sensitivities and start antibiotics, if needed.  Bacterial vaginitis confirmed on wet prep.  Self care instructions given including avoiding douching.  Gonorrhea and Chlamydia testing obtained.   - Treatment: Flagyl 500 BID x 7 days and advised patient to not drink alcohol while taking this medication.   Return precautions including abdominal pain, fever, chills, nausea, or vomiting given. Discussed MDM, treatment plan and plan for follow-up with patient who agrees with plan.      Final Clinical Impressions(s) / UC Diagnoses   Final diagnoses:  Bacterial vaginosis     Discharge Instructions      Stop by the pharmacy to pick up your prescriptions.  Follow up with your primary care provider as needed.      ED Prescriptions     Medication Sig Dispense Auth. Provider   metroNIDAZOLE (FLAGYL) 500 MG tablet Take 1 tablet (500 mg total) by mouth 2 (two) times daily. 14 tablet Tyreshia Ingman, Seward Meth, DO      PDMP not reviewed this encounter.   Katha Cabal, DO 08/28/22 1649

## 2022-08-28 NOTE — Discharge Instructions (Addendum)
Stop by the pharmacy to pick up your prescriptions.  Follow up with your primary care provider as needed.  

## 2022-08-29 LAB — URINE CULTURE: Culture: NO GROWTH

## 2022-08-29 LAB — CERVICOVAGINAL ANCILLARY ONLY
Chlamydia: NEGATIVE
Comment: NEGATIVE
Comment: NORMAL
Neisseria Gonorrhea: NEGATIVE

## 2022-09-05 ENCOUNTER — Ambulatory Visit: Payer: Medicaid Other

## 2022-09-23 DIAGNOSIS — Z419 Encounter for procedure for purposes other than remedying health state, unspecified: Secondary | ICD-10-CM | POA: Diagnosis not present

## 2022-10-24 DIAGNOSIS — Z419 Encounter for procedure for purposes other than remedying health state, unspecified: Secondary | ICD-10-CM | POA: Diagnosis not present

## 2022-11-24 DIAGNOSIS — Z419 Encounter for procedure for purposes other than remedying health state, unspecified: Secondary | ICD-10-CM | POA: Diagnosis not present

## 2022-12-24 DIAGNOSIS — Z419 Encounter for procedure for purposes other than remedying health state, unspecified: Secondary | ICD-10-CM | POA: Diagnosis not present

## 2023-01-24 DIAGNOSIS — Z419 Encounter for procedure for purposes other than remedying health state, unspecified: Secondary | ICD-10-CM | POA: Diagnosis not present

## 2023-02-23 DIAGNOSIS — Z419 Encounter for procedure for purposes other than remedying health state, unspecified: Secondary | ICD-10-CM | POA: Diagnosis not present

## 2023-03-12 ENCOUNTER — Ambulatory Visit
Admission: RE | Admit: 2023-03-12 | Discharge: 2023-03-12 | Disposition: A | Discharge status: Home / Self Care | Payer: Medicaid Other | Source: Ambulatory Visit | Attending: Emergency Medicine | Admitting: Emergency Medicine

## 2023-03-12 VITALS — BP 118/70 | HR 85 | Temp 99.2°F | Resp 16 | Ht 67.0 in | Wt 180.0 lb

## 2023-03-12 DIAGNOSIS — N760 Acute vaginitis: Secondary | ICD-10-CM

## 2023-03-12 DIAGNOSIS — B9689 Other specified bacterial agents as the cause of diseases classified elsewhere: Secondary | ICD-10-CM

## 2023-03-12 DIAGNOSIS — B3731 Acute candidiasis of vulva and vagina: Secondary | ICD-10-CM

## 2023-03-12 LAB — WET PREP, GENITAL
Sperm: NONE SEEN
Trich, Wet Prep: NONE SEEN
WBC, Wet Prep HPF POC: >10 — AB (ref <10)

## 2023-03-12 MED ORDER — FLUCONAZOLE 150 MG PO TABS: 150.0000 mg | ORAL_TABLET | ORAL | 0 refills | Status: AC

## 2023-03-12 MED ORDER — METRONIDAZOLE 500 MG PO TABS: 500.0000 mg | ORAL_TABLET | Freq: Two times a day (BID) | ORAL | 0 refills | Status: AC

## 2023-03-12 NOTE — Discharge Instructions (Addendum)
Take the Flagyl (metronidazole) 500 mg twice daily for treatment of your bacterial vaginosis.  Avoid alcohol while on the metronidazole as taken together will cause of vomiting.  Bacterial vaginosis is often caused by a imbalance of bacteria in your vaginal vault.  This is sometimes a result of using tampons or hormonal fluctuations during her menstrual cycle.  You if your symptoms are recurrent you can try using a boric acid suppository twice weekly to help maintain the acid-base balance in your vagina vault which could prevent further infection.  You can also try vaginal probiotics to help return normal bacterial balance.   For your vaginal yeast infection take 1 Diflucan tablet now and repeat dosing every 3 days for total of 3 doses.  Your STI testing will be back in the next 1 to 2 days and if you test positive for any infection you will be contacted by phone and treatment options will be provided.  If your results are negative they will appear in your MyChart.

## 2023-03-12 NOTE — ED Triage Notes (Signed)
Pt presents to UC for STD testing, c/o vaginal itching & discharge x3 days.

## 2023-03-12 NOTE — ED Provider Notes (Signed)
[facility]    CSN: [id number] Arrival date & time: 03/12/23  1246      History   Chief Complaint Chief Complaint  Patient presents with   Vaginal Itching   Vaginal Discharge    HPI Hailey Martin is a 27 y.o. female.   HPI  27 year old female with a past medical history significant for dysmenorrhea, history of cervicitis, subareolar, and anemia presents for evaluation of vaginal discharge and itching that began 3 days ago.  She states the discharge does have an odor but it is white in color.  She denies any pain with urination or urinary urgency or frequency, abdominal pain, low back pain, nausea, or vomiting.  She states that she would like to be tested for STIs.  Past Medical History:  Diagnosis Date   Allergy    Anemia    Delivery of pregnancy by cesarean section 08/11/2018   Dysmenorrhea    History of cervicitis 11/26/2012   Chlamydia     Overweight    Seborrhea     Patient Active Problem List   Diagnosis Date Noted   Vapes nicotine containing substance 01/17/2022   History of tubal ligation 2020 01/17/2022   Trichomonas infection 01/17/22 01/17/2022   Candidal vaginitis 06/18/2021   Morbid obesity (HCC) 180 lbs 12/25/2020   Shortness of breath    Non-intractable vomiting    Symptomatic anemia 02/22/2020   Menorrhagia with irregular cycle    Overweight 181 lbs 12/13/2019   History of bilateral tubal ligation 2020 04/29/2019   Anemia affecting pregnancy in third trimester 11/17/2016   Previous cesarean section 09/12/2016   Dysmenorrhea 09/07/2014   Allergic rhinitis, seasonal 09/07/2014   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/07/2014    Past Surgical History:  Procedure Laterality Date   CESAREAN SECTION N/A 08/29/2015   Procedure: CESAREAN SECTION;  Surgeon: Vena Austria, MD;  Location: [facility];  Service: Obstetrics;  Laterality: N/A;   CESAREAN SECTION N/A 01/28/2017   Procedure: CESAREAN SECTION;  Surgeon: Conard Novak, MD;  Location: [facility];   Service: Obstetrics;  Laterality: N/A;   CESAREAN SECTION WITH BILATERAL TUBAL LIGATION N/A 08/11/2018   Procedure: CESAREAN SECTION WITH TUBAL LIGATION;  Surgeon: Vena Austria, MD;  Location: [facility];  Service: Obstetrics;  Laterality: N/A;   WISDOM TOOTH EXTRACTION      OB History     Gravida  3   Para  3   Term  3   Preterm  0   AB  0   Living  3      SAB  0   IAB  0   Ectopic  0   Multiple  0   Live Births  3        Obstetric Comments  LTCS for placental abruption          Home Medications    Prior to Admission medications   Medication Sig Start Date End Date Taking? Authorizing Provider  fluconazole (DIFLUCAN) 150 MG tablet Take 1 tablet (150 mg total) by mouth every 3 (three) days for 3 doses. 03/12/23 03/19/23 Yes Becky Augusta, NP  metroNIDAZOLE (FLAGYL) 500 MG tablet Take 1 tablet (500 mg total) by mouth 2 (two) times daily. 03/12/23  Yes Becky Augusta, NP    Family History Family History  Problem Relation Age of Onset   Heart failure Father 18   Alcohol abuse Father    Kidney disease Father    Liver disease Father    Thyroid disease Mother  Breast cancer Paternal Grandmother    Diabetes Paternal Grandmother    Hypertension Paternal Grandmother    Colon cancer Paternal Grandfather    Hypertension Paternal Grandfather    Heart disease Paternal Grandfather     Social History Social History   Tobacco Use   Smoking status: Some Days    Types: E-cigarettes   Smokeless tobacco: Never  Vaping Use   Vaping status: Never Used  Substance Use Topics   Alcohol use: Yes    Alcohol/week: 5.0 standard drinks of alcohol    Types: 2 Glasses of wine, 3 Shots of liquor per week    Comment: last use 01/11/22 2x/mo   Drug use: Not Currently    Types: Marijuana    Comment: last use 4 years ago, patient reprots trying marijuana denies continuous use.     Allergies   Patient has no known allergies.   Review of Systems Review of Systems   Constitutional:  Negative for fever.  Gastrointestinal:  Negative for abdominal pain.  Genitourinary:  Positive for vaginal discharge and vaginal pain. Negative for dyspareunia, frequency, hematuria and urgency.  Musculoskeletal:  Negative for back pain.     Physical Exam Triage Vital Signs ED Triage Vitals [03/12/23 1305]  Encounter Vitals Group     BP      Systolic BP Percentile      Diastolic BP Percentile      Pulse      Resp 16     Temp      Temp Source Oral     SpO2      Weight      Height      Head Circumference      Peak Flow      Pain Score      Pain Loc      Pain Education      Exclude from Growth Chart    No data found.  Updated Vital Signs BP 118/70 (BP Location: Left Arm)   Pulse 85   Temp 99.2 F (37.3 C) (Oral)   Resp 16   Ht 5\' 7"  (1.702 m)   Wt 180 lb (81.6 kg)   LMP 02/12/2023 (Approximate)   SpO2 99%   BMI 28.19 kg/m   Visual Acuity Right Eye Distance:   Left Eye Distance:   Bilateral Distance:    Right Eye Near:   Left Eye Near:    Bilateral Near:     Physical Exam Vitals and nursing note reviewed.  Constitutional:      Appearance: Normal appearance. She is not ill-appearing.  HENT:     Head: Normocephalic and atraumatic.  Skin:    General: Skin is warm and dry.     Capillary Refill: Capillary refill takes less than 2 seconds.  Neurological:     General: No focal deficit present.     Mental Status: She is alert and oriented to person, place, and time.      UC Treatments / Results  Labs (all labs ordered are listed, but only abnormal results are displayed) Labs Reviewed  WET PREP, GENITAL - Abnormal; Notable for the following components:      Result Value   Yeast Wet Prep HPF POC PRESENT (*)    Clue Cells Wet Prep HPF POC PRESENT (*)    WBC, Wet Prep HPF POC >10 (*)    All other components within normal limits  CERVICOVAGINAL ANCILLARY ONLY    EKG   Radiology No results found.  Procedures Procedures  (  including critical care time)  Medications Ordered in UC Medications - No data to display  Initial Impression / Assessment and Plan / UC Course  I have reviewed the triage vital signs and the nursing notes.  Pertinent labs & imaging results that were available during my care of the patient were reviewed by me and considered in my medical decision making (see chart for details).   Patient is a pleasant, nontoxic-appearing 27 year old female presenting for evaluation of 3 days worth of malodorous white vaginal discharge.  She denies any urinary signs or symptoms, abdominal pain, or low back pain.  Also no nausea or vomiting.  She states she would also like to be tested for STIs though she does not have any particular concern for infection at this time.  She states she would just like to be tested to be sure.  I will order cervical vaginal cytology swab as well as a vaginal wet prep.  Vaginal wet prep is positive for both yeast and clue cells.  Cervical vaginal swab is still pending.  I will discharge patient home with a diagnosis of bacterial vaginosis and vaginal yeast infection and treated with accommodation of metronidazole and Diflucan at this time.  I would not treat her empirically for STIs but rather will wait for the test results.   Final Clinical Impressions(s) / UC Diagnoses   Final diagnoses:  BV (bacterial vaginosis)  Vaginal yeast infection     Discharge Instructions      Take the Flagyl (metronidazole) 500 mg twice daily for treatment of your bacterial vaginosis.  Avoid alcohol while on the metronidazole as taken together will cause of vomiting.  Bacterial vaginosis is often caused by a imbalance of bacteria in your vaginal vault.  This is sometimes a result of using tampons or hormonal fluctuations during her menstrual cycle.  You if your symptoms are recurrent you can try using a boric acid suppository twice weekly to help maintain the acid-base balance in your  vagina vault which could prevent further infection.  You can also try vaginal probiotics to help return normal bacterial balance.   For your vaginal yeast infection take 1 Diflucan tablet now and repeat dosing every 3 days for total of 3 doses.  Your STI testing will be back in the next 1 to 2 days and if you test positive for any infection you will be contacted by phone and treatment options will be provided.  If your results are negative they will appear in your MyChart.     ED Prescriptions     Medication Sig Dispense Auth. Provider   fluconazole (DIFLUCAN) 150 MG tablet Take 1 tablet (150 mg total) by mouth every 3 (three) days for 3 doses. 3 tablet Becky Augusta, NP   metroNIDAZOLE (FLAGYL) 500 MG tablet Take 1 tablet (500 mg total) by mouth 2 (two) times daily. 14 tablet Becky Augusta, NP      PDMP not reviewed this encounter.   Becky Augusta, NP 03/12/23 [phone]

## 2023-03-13 LAB — CERVICOVAGINAL ANCILLARY ONLY
Chlamydia: NEGATIVE
Comment: NEGATIVE
Comment: NEGATIVE
Comment: NORMAL
Neisseria Gonorrhea: NEGATIVE
Trichomonas: NEGATIVE

## 2023-03-26 DIAGNOSIS — Z419 Encounter for procedure for purposes other than remedying health state, unspecified: Secondary | ICD-10-CM | POA: Diagnosis not present

## 2023-04-26 DIAGNOSIS — Z419 Encounter for procedure for purposes other than remedying health state, unspecified: Secondary | ICD-10-CM | POA: Diagnosis not present

## 2023-05-24 DIAGNOSIS — Z419 Encounter for procedure for purposes other than remedying health state, unspecified: Secondary | ICD-10-CM | POA: Diagnosis not present

## 2023-07-05 DIAGNOSIS — Z419 Encounter for procedure for purposes other than remedying health state, unspecified: Secondary | ICD-10-CM | POA: Diagnosis not present

## 2023-08-04 DIAGNOSIS — Z419 Encounter for procedure for purposes other than remedying health state, unspecified: Secondary | ICD-10-CM | POA: Diagnosis not present

## 2023-09-04 DIAGNOSIS — Z419 Encounter for procedure for purposes other than remedying health state, unspecified: Secondary | ICD-10-CM | POA: Diagnosis not present

## 2023-10-04 DIAGNOSIS — Z419 Encounter for procedure for purposes other than remedying health state, unspecified: Secondary | ICD-10-CM | POA: Diagnosis not present

## 2023-11-04 DIAGNOSIS — Z419 Encounter for procedure for purposes other than remedying health state, unspecified: Secondary | ICD-10-CM | POA: Diagnosis not present

## 2023-12-05 DIAGNOSIS — Z419 Encounter for procedure for purposes other than remedying health state, unspecified: Secondary | ICD-10-CM | POA: Diagnosis not present

## 2024-01-04 DIAGNOSIS — Z419 Encounter for procedure for purposes other than remedying health state, unspecified: Secondary | ICD-10-CM | POA: Diagnosis not present
